# Patient Record
Sex: Male | Born: 1955 | Race: White | Hispanic: No | Smoking: Former smoker
Health system: Southern US, Community
[De-identification: ages and names within clinical notes are randomized; demographics above are authoritative.]

## PROBLEM LIST (undated history)

## (undated) DIAGNOSIS — T7840XA Allergy, unspecified, initial encounter: Secondary | ICD-10-CM

## (undated) DIAGNOSIS — E119 Type 2 diabetes mellitus without complications: Secondary | ICD-10-CM

## (undated) DIAGNOSIS — E669 Obesity, unspecified: Secondary | ICD-10-CM

## (undated) DIAGNOSIS — E7800 Pure hypercholesterolemia, unspecified: Secondary | ICD-10-CM

## (undated) DIAGNOSIS — G4730 Sleep apnea, unspecified: Secondary | ICD-10-CM

## (undated) DIAGNOSIS — R1319 Other dysphagia: Secondary | ICD-10-CM

## (undated) DIAGNOSIS — K635 Polyp of colon: Secondary | ICD-10-CM

## (undated) DIAGNOSIS — R42 Dizziness and giddiness: Secondary | ICD-10-CM

## (undated) DIAGNOSIS — K219 Gastro-esophageal reflux disease without esophagitis: Secondary | ICD-10-CM

## (undated) DIAGNOSIS — M109 Gout, unspecified: Secondary | ICD-10-CM

## (undated) DIAGNOSIS — I10 Essential (primary) hypertension: Secondary | ICD-10-CM

## (undated) DIAGNOSIS — K2970 Gastritis, unspecified, without bleeding: Secondary | ICD-10-CM

## (undated) DIAGNOSIS — J449 Chronic obstructive pulmonary disease, unspecified: Secondary | ICD-10-CM

## (undated) DIAGNOSIS — M1990 Unspecified osteoarthritis, unspecified site: Secondary | ICD-10-CM

## (undated) HISTORY — DX: Unspecified osteoarthritis, unspecified site: M19.90

## (undated) HISTORY — DX: Sleep apnea, unspecified: G47.30

## (undated) HISTORY — DX: Allergy, unspecified, initial encounter: T78.40XA

## (undated) HISTORY — PX: OTHER SURGICAL HISTORY: SHX169

## (undated) HISTORY — DX: Obesity, unspecified: E66.9

## (undated) HISTORY — DX: Gastro-esophageal reflux disease without esophagitis: K21.9

---

## 2005-10-10 ENCOUNTER — Ambulatory Visit: Payer: Self-pay | Admitting: Family Medicine

## 2013-05-17 ENCOUNTER — Ambulatory Visit: Payer: Self-pay | Admitting: Physician Assistant

## 2013-06-16 ENCOUNTER — Ambulatory Visit: Payer: Self-pay | Admitting: Physical Medicine and Rehabilitation

## 2014-02-07 ENCOUNTER — Ambulatory Visit: Payer: Self-pay | Admitting: Gastroenterology

## 2014-04-07 ENCOUNTER — Ambulatory Visit: Payer: Self-pay | Admitting: Internal Medicine

## 2014-04-08 DIAGNOSIS — M47812 Spondylosis without myelopathy or radiculopathy, cervical region: Secondary | ICD-10-CM

## 2014-04-08 HISTORY — DX: Spondylosis without myelopathy or radiculopathy, cervical region: M47.812

## 2014-09-19 ENCOUNTER — Encounter: Payer: Self-pay | Admitting: Emergency Medicine

## 2014-09-19 ENCOUNTER — Ambulatory Visit
Admission: EM | Admit: 2014-09-19 | Discharge: 2014-09-19 | Disposition: A | Discharge status: Home / Self Care | Payer: 59 | Attending: Family Medicine | Admitting: Family Medicine

## 2014-09-19 DIAGNOSIS — H109 Unspecified conjunctivitis: Secondary | ICD-10-CM

## 2014-09-19 HISTORY — DX: Pure hypercholesterolemia, unspecified: E78.00

## 2014-09-19 HISTORY — DX: Gout, unspecified: M10.9

## 2014-09-19 HISTORY — DX: Essential (primary) hypertension: I10

## 2014-09-19 NOTE — ED Notes (Signed)
Bilateral eye burning since this morning.  Burning yesterday also reported.  No relief from eye drops.  This morning reports some drainage from eyes.  Reports "sleep like" drainage.

## 2014-09-19 NOTE — ED Provider Notes (Signed)
CSN: [id number]     Arrival date & time 09/19/14  1337 History   First MD Initiated Contact with Patient 09/19/14 1505     Chief Complaint  Patient presents with  . Eye Problem   (Consider location/radiation/quality/duration/timing/severity/associated sxs/prior Treatment) Patient is a 59 y.o. male presenting with eye problem.  Eye Problem Location:  Both Severity:  Mild Onset quality:  Sudden Duration:  2 days (2 days h/o mild watery drainage, tearing from both eyes; denies foreign body sensation or injury) Timing:  Constant Associated symptoms: redness and tearing   Associated symptoms: no blurred vision, no decreased vision and no photophobia   Associated symptoms comment:  Mild runny nose and sneezing   Past Medical History  Diagnosis Date  . Gout   . Hypertension   . High cholesterol    History reviewed. No pertinent past surgical history. History reviewed. No pertinent family history. History  Substance Use Topics  . Smoking status: Former Research scientist (life sciences)  . Smokeless tobacco: Current User    Types: Chew     Comment: dip occassionally  . Alcohol Use: Yes     Comment: occ    Review of Systems  Eyes: Positive for redness. Negative for blurred vision and photophobia.    Allergies  Review of patient's allergies indicates no known allergies.  Home Medications   Prior to Admission medications   Medication Sig Start Date End Date Taking? Authorizing Provider  allopurinol (ZYLOPRIM) 300 MG tablet Take 300 mg by mouth daily.   Yes Historical Provider, MD  amLODipine (NORVASC) 10 MG tablet Take 10 mg by mouth daily.   Yes Historical Provider, MD  aspirin 81 MG tablet Take 81 mg by mouth daily.   Yes Historical Provider, MD  atorvastatin (LIPITOR) 40 MG tablet Take 40 mg by mouth daily.   Yes Historical Provider, MD  cetirizine (ZYRTEC) 10 MG tablet Take 10 mg by mouth daily.   Yes Historical Provider, MD  ezetimibe (ZETIA) 10 MG tablet Take 10 mg by mouth daily.   Yes  Historical Provider, MD  gabapentin (NEURONTIN) 300 MG capsule Take 300 mg by mouth 3 (three) times daily.   Yes Historical Provider, MD  losartan (COZAAR) 100 MG tablet Take 100 mg by mouth daily.   Yes Historical Provider, MD  naproxen (NAPROSYN) 500 MG tablet Take 500 mg by mouth 2 (two) times daily with a meal.   Yes Historical Provider, MD  omeprazole (PRILOSEC) 20 MG capsule Take 20 mg by mouth daily.   Yes Historical Provider, MD   BP 129/70 mmHg  Pulse 50  Temp(Src) 98 F (36.7 C) (Oral)  Resp 16  Ht 5\' 8"  (1.727 m)  Wt 215 lb (97.523 kg)  BMI 32.70 kg/m2  SpO2 97% Physical Exam  Constitutional: He appears well-developed and well-nourished. No distress.  HENT:  Head: Normocephalic.  Eyes: EOM and lids are normal. Pupils are equal, round, and reactive to light. Left eye exhibits no discharge. Right conjunctiva is injected. Left conjunctiva is injected. No scleral icterus.  Conjunctiva mildly injected; no purulent drainage; mild tearing  Skin: He is not diaphoretic.  Nursing note reviewed.   ED Course  Procedures (including critical care time) Labs Review Labs Reviewed - No data to display  Imaging Review No results found.   MDM   1. Bilateral conjunctivitis   (likely viral vs allergic)  Plan: 1. diagnosis reviewed with patient 2. Recommend supportive treatment with otc oral antihistamines, preservative free lubricant eye drops, cool compresses 3. F/u  prn if symptoms worsen or don't improve    Norval Gable, MD 09/19/14 2037

## 2014-09-19 NOTE — ED Notes (Signed)
Verified medications from care everywhere and med list updated

## 2014-09-19 NOTE — ED Notes (Addendum)
Unable to verify med list because patient does not know name of  Medications.

## 2014-10-04 ENCOUNTER — Encounter: Payer: Self-pay | Admitting: Family Medicine

## 2014-10-04 ENCOUNTER — Ambulatory Visit (INDEPENDENT_AMBULATORY_CARE_PROVIDER_SITE_OTHER): Payer: 59 | Admitting: Family Medicine

## 2014-10-04 VITALS — BP 132/73 | HR 52 | Temp 98.6°F | Ht 67.7 in | Wt 217.0 lb

## 2014-10-04 DIAGNOSIS — R5383 Other fatigue: Secondary | ICD-10-CM

## 2014-10-04 DIAGNOSIS — I10 Essential (primary) hypertension: Secondary | ICD-10-CM | POA: Insufficient documentation

## 2014-10-04 DIAGNOSIS — E7800 Pure hypercholesterolemia, unspecified: Secondary | ICD-10-CM | POA: Insufficient documentation

## 2014-10-04 MED ORDER — EZETIMIBE 10 MG PO TABS: 10.0000 mg | ORAL_TABLET | Freq: Every day | ORAL | Status: DC

## 2014-10-04 MED ORDER — OMEPRAZOLE 20 MG PO CPDR: 20.0000 mg | DELAYED_RELEASE_CAPSULE | Freq: Every day | ORAL | Status: DC

## 2014-10-04 MED ORDER — LOSARTAN POTASSIUM 100 MG PO TABS: 100.0000 mg | ORAL_TABLET | Freq: Every day | ORAL | Status: DC

## 2014-10-04 MED ORDER — ATORVASTATIN CALCIUM 40 MG PO TABS: 40.0000 mg | ORAL_TABLET | Freq: Every day | ORAL | Status: DC

## 2014-10-04 MED ORDER — GABAPENTIN 300 MG PO CAPS: 300.0000 mg | ORAL_CAPSULE | Freq: Three times a day (TID) | ORAL | Status: DC

## 2014-10-04 MED ORDER — AMLODIPINE BESYLATE 10 MG PO TABS: 10.0000 mg | ORAL_TABLET | Freq: Every day | ORAL | Status: DC

## 2014-10-04 MED ORDER — ALLOPURINOL 300 MG PO TABS: 300.0000 mg | ORAL_TABLET | Freq: Every day | ORAL | Status: DC

## 2014-10-04 MED ORDER — CETIRIZINE HCL 10 MG PO TABS: 10.0000 mg | ORAL_TABLET | Freq: Every day | ORAL | Status: DC

## 2014-10-04 NOTE — Assessment & Plan Note (Signed)
The current medical regimen is effective;  continue present plan and medications.  

## 2014-10-04 NOTE — Progress Notes (Signed)
   BP 132/73 mmHg  Pulse 52  Temp(Src) 98.6 F (37 C)  Ht 5' 7.7" (1.72 m)  Wt 217 lb (98.431 kg)  BMI 33.27 kg/m2  SpO2 96%   Subjective:    Patient ID: Daniel Cook, male    DOB: [birth year], 57 y.o.   MRN: [id number]  HPI: Daniel Cook is a 59 y.o. male  Chief Complaint  Patient presents with  . Hyperlipidemia  . Hypertension  . Gout  . Fatigue  pts medical issues stable but feels sluggish. Discussed with card and not felt to be angina sx Using CPAP and record usually 8+ hrs and sleeps well Other meds doing well Takes every day and no side effects Previously felt fatigue was depression and a Dr  Hanley Seamen prozac made feel not himself and threw away. Done years ago  No depression sx now  Relevant past medical, surgical, family and social history reviewed and updated as indicated. Interim medical history since our last visit reviewed. Allergies and medications reviewed and updated.  Review of Systems  Constitutional: Negative.   Respiratory: Negative.   Cardiovascular: Negative.     Per HPI unless specifically indicated above     Objective:    BP 132/73 mmHg  Pulse 52  Temp(Src) 98.6 F (37 C)  Ht 5' 7.7" (1.72 m)  Wt 217 lb (98.431 kg)  BMI 33.27 kg/m2  SpO2 96%  Wt Readings from Last 3 Encounters:  10/04/14 217 lb (98.431 kg)  04/14/14 216 lb (97.977 kg)  09/19/14 215 lb (97.523 kg)    Physical Exam  Constitutional: He is oriented to person, place, and time. He appears well-developed and well-nourished. No distress.  HENT:  Head: Normocephalic and atraumatic.  Right Ear: Hearing normal.  Left Ear: Hearing normal.  Nose: Nose normal.  Eyes: Conjunctivae, EOM and lids are normal. Right eye exhibits no discharge. Left eye exhibits no discharge. No scleral icterus.  Cardiovascular: Normal rate, regular rhythm and normal heart sounds.   Pulmonary/Chest: Effort normal and breath sounds normal. No respiratory distress.  Abdominal: Soft. Bowel  sounds are normal. There is no splenomegaly or hepatomegaly.  Musculoskeletal: Normal range of motion.  Neurological: He is alert and oriented to person, place, and time.  Skin: Skin is intact. No rash noted. No erythema.  Psychiatric: He has a normal mood and affect. His speech is normal and behavior is normal. Judgment and thought content normal. Cognition and memory are normal.    No results found for this or any previous visit.    Assessment & Plan:   Problem List Items Addressed This Visit      Cardiovascular and Mediastinum   Hypertension - Primary    The current medical regimen is effective;  continue present plan and medications.       Relevant Medications   amLODipine (NORVASC) 10 MG tablet   atorvastatin (LIPITOR) 40 MG tablet   ezetimibe (ZETIA) 10 MG tablet   losartan (COZAAR) 100 MG tablet   Other Relevant Orders   Basic metabolic panel    Other Visit Diagnoses    Other fatigue        check BMP discuss nutrition exercise wt loss hold zyrtec to see if fatague sx     Relevant Orders    Basic metabolic panel        Follow up plan: Return for Physical Exam.

## 2014-10-11 ENCOUNTER — Telehealth: Payer: Self-pay

## 2014-10-11 NOTE — Telephone Encounter (Signed)
Called and spoke with patient, appointment scheduled for 10/13/14 @ 9:00am.

## 2014-10-11 NOTE — Telephone Encounter (Signed)
-----   Message from Valerie Roys, DO sent at 10/11/2014  2:26 PM EDT ----- Regarding: Lab appt Please have patient come in for his BMP- it was ordered by Dr. Jeananne Rama and it looks like he didn't get it done.

## 2014-10-13 ENCOUNTER — Other Ambulatory Visit: Payer: 59

## 2014-10-13 DIAGNOSIS — I10 Essential (primary) hypertension: Secondary | ICD-10-CM

## 2014-10-14 ENCOUNTER — Other Ambulatory Visit: Payer: Self-pay | Admitting: Family Medicine

## 2014-10-14 ENCOUNTER — Encounter: Payer: Self-pay | Admitting: Family Medicine

## 2014-10-14 DIAGNOSIS — R739 Hyperglycemia, unspecified: Secondary | ICD-10-CM

## 2014-10-14 LAB — BASIC METABOLIC PANEL
BUN/Creatinine Ratio: 10 (ref 9–20)
BUN: 11 mg/dL (ref 6–24)
CALCIUM: 9.2 mg/dL (ref 8.7–10.2)
CO2: 24 mmol/L (ref 18–29)
Chloride: 101 mmol/L (ref 97–108)
Creatinine, Ser: 1.08 mg/dL (ref 0.76–1.27)
GFR calc non Af Amer: 76 mL/min/{1.73_m2} (ref >59)
GFR, EST AFRICAN AMERICAN: 88 mL/min/{1.73_m2} (ref >59)
Glucose: 128 mg/dL — ABNORMAL HIGH (ref 65–99)
POTASSIUM: 4.4 mmol/L (ref 3.5–5.2)
Sodium: 141 mmol/L (ref 134–144)

## 2014-10-26 ENCOUNTER — Encounter: Payer: Self-pay | Admitting: Family Medicine

## 2014-10-26 ENCOUNTER — Ambulatory Visit (INDEPENDENT_AMBULATORY_CARE_PROVIDER_SITE_OTHER): Payer: 59 | Admitting: Family Medicine

## 2014-10-26 VITALS — BP 111/68 | HR 49 | Temp 99.6°F | Ht 68.0 in | Wt 213.0 lb

## 2014-10-26 DIAGNOSIS — M4692 Unspecified inflammatory spondylopathy, cervical region: Secondary | ICD-10-CM

## 2014-10-26 DIAGNOSIS — Z0000 Encounter for general adult medical examination without abnormal findings: Secondary | ICD-10-CM

## 2014-10-26 DIAGNOSIS — M1000 Idiopathic gout, unspecified site: Secondary | ICD-10-CM | POA: Diagnosis not present

## 2014-10-26 DIAGNOSIS — I10 Essential (primary) hypertension: Secondary | ICD-10-CM | POA: Diagnosis not present

## 2014-10-26 DIAGNOSIS — M109 Gout, unspecified: Secondary | ICD-10-CM | POA: Insufficient documentation

## 2014-10-26 DIAGNOSIS — E7800 Pure hypercholesterolemia, unspecified: Secondary | ICD-10-CM

## 2014-10-26 DIAGNOSIS — M47812 Spondylosis without myelopathy or radiculopathy, cervical region: Secondary | ICD-10-CM | POA: Insufficient documentation

## 2014-10-26 DIAGNOSIS — G4730 Sleep apnea, unspecified: Secondary | ICD-10-CM

## 2014-10-26 LAB — MICROSCOPIC EXAMINATION

## 2014-10-26 LAB — MICROALBUMIN, URINE WAIVED
Creatinine, Urine Waived: 200 mg/dL (ref 10–300)
Microalb, Ur Waived: 80 mg/L — ABNORMAL HIGH (ref 0–19)

## 2014-10-26 LAB — URINALYSIS, ROUTINE W REFLEX MICROSCOPIC
Bilirubin, UA: NEGATIVE
Glucose, UA: NEGATIVE
Ketones, UA: NEGATIVE
Leukocytes, UA: NEGATIVE
NITRITE UA: NEGATIVE
Specific Gravity, UA: 1.01 (ref 1.005–1.030)
UUROB: 0.2 mg/dL (ref 0.2–1.0)
pH, UA: 6.5 (ref 5.0–7.5)

## 2014-10-26 MED ORDER — COLCHICINE 0.6 MG PO TABS: 0.6000 mg | ORAL_TABLET | Freq: Every day | ORAL | Status: DC | PRN

## 2014-10-26 NOTE — Assessment & Plan Note (Signed)
The current medical regimen is effective;  continue present plan and medications.  

## 2014-10-26 NOTE — Progress Notes (Signed)
BP 111/68 mmHg  Pulse 49  Temp(Src) 99.6 F (37.6 C)  Ht 5\' 8"  (1.727 m)  Wt 213 lb (96.616 kg)  BMI 32.39 kg/m2  SpO2 95%   Subjective:    Patient ID: Daniel Cook, male    DOB: 12-Dec-1955, 59 y.o.   MRN: [id number]  HPI: Daniel Cook is a 59 y.o. male  Chief Complaint  Patient presents with  . Annual Exam   patient doing well with medications symptoms using his CPAP faithfully The C3 arthritis is controlled well with gabapentin.  Relevant past medical, surgical, family and social history reviewed and updated as indicated. Interim medical history since our last visit reviewed. Allergies and medications reviewed and updated.  Review of Systems  Constitutional: Negative.   HENT: Negative.   Eyes: Negative.   Respiratory: Negative.   Cardiovascular: Negative.   Endocrine: Negative.   Musculoskeletal: Negative.   Skin: Negative.   Allergic/Immunologic: Negative.   Neurological: Negative.   Hematological: Negative.   Psychiatric/Behavioral: Negative.     Per HPI unless specifically indicated above     Objective:    BP 111/68 mmHg  Pulse 49  Temp(Src) 99.6 F (37.6 C)  Ht 5\' 8"  (1.727 m)  Wt 213 lb (96.616 kg)  BMI 32.39 kg/m2  SpO2 95%  Wt Readings from Last 3 Encounters:  10/26/14 213 lb (96.616 kg)  10/04/14 217 lb (98.431 kg)  04/14/14 216 lb (97.977 kg)    Physical Exam  Constitutional: He is oriented to person, place, and time. He appears well-developed and well-nourished.  HENT:  Head: Normocephalic and atraumatic.  Right Ear: External ear normal.  Left Ear: External ear normal.  Eyes: Conjunctivae and EOM are normal. Pupils are equal, round, and reactive to light.  Neck: Normal range of motion. Neck supple.  Cardiovascular: Normal rate, regular rhythm, normal heart sounds and intact distal pulses.   Pulmonary/Chest: Effort normal and breath sounds normal.  Abdominal: Soft. Bowel sounds are normal. There is no splenomegaly or  hepatomegaly.  Genitourinary: Rectum normal, prostate normal and penis normal.  Musculoskeletal: Normal range of motion.  Neurological: He is alert and oriented to person, place, and time. He has normal reflexes.  Skin: No rash noted. No erythema.  Psychiatric: He has a normal mood and affect. His behavior is normal. Judgment and thought content normal.    Results for orders placed or performed in visit on 55/73/22  Basic Metabolic Panel (BMET)  Result Value Ref Range   Glucose 128 (H) 65 - 99 mg/dL   BUN 11 6 - 24 mg/dL   Creatinine, Ser 1.08 0.76 - 1.27 mg/dL   GFR calc non Af Amer 76 >59 mL/min/1.73   GFR calc Af Amer 88 >59 mL/min/1.73   BUN/Creatinine Ratio 10 9 - 20   Sodium 141 134 - 144 mmol/L   Potassium 4.4 3.5 - 5.2 mmol/L   Chloride 101 97 - 108 mmol/L   CO2 24 18 - 29 mmol/L   Calcium 9.2 8.7 - 10.2 mg/dL      Assessment & Plan:   Problem List Items Addressed This Visit      Cardiovascular and Mediastinum   Hypertension    The current medical regimen is effective;  continue present plan and medications.       Relevant Orders   Basic metabolic panel     Musculoskeletal and Integument   Neck arthritis   Relevant Medications   colchicine 0.6 MG tablet     Other  High cholesterol    The current medical regimen is effective;  continue present plan and medications.       Relevant Orders   LP+ALT+AST [place], Waived   Sleep apnea   Gout    Other Visit Diagnoses    Routine general medical examination at a health care facility    -  Primary    Relevant Orders    CBC with Differential/Platelet    Comprehensive metabolic panel    Lipid Panel w/o Chol/HDL Ratio    PSA    TSH    Urinalysis, Routine w reflex microscopic    Microalbumin, Urine Waived    Interval gout        Relevant Medications    colchicine 0.6 MG tablet    Other Relevant Orders    Uric acid        Follow up plan: Return in about 6 months (around 04/28/2015), or if symptoms  worsen or fail to improve, for med check.

## 2014-10-27 ENCOUNTER — Encounter: Payer: Self-pay | Admitting: Family Medicine

## 2014-10-27 LAB — CBC WITH DIFFERENTIAL/PLATELET
BASOS: 0 %
Basophils Absolute: 0 10*3/uL (ref 0.0–0.2)
EOS (ABSOLUTE): 0.1 10*3/uL (ref 0.0–0.4)
Eos: 1 %
HEMOGLOBIN: 13.8 g/dL (ref 12.6–17.7)
Hematocrit: 41.5 % (ref 37.5–51.0)
IMMATURE GRANS (ABS): 0 10*3/uL (ref 0.0–0.1)
Immature Granulocytes: 0 %
LYMPHS ABS: 1.9 10*3/uL (ref 0.7–3.1)
Lymphs: 19 %
MCH: 31.4 pg (ref 26.6–33.0)
MCHC: 33.3 g/dL (ref 31.5–35.7)
MCV: 94 fL (ref 79–97)
MONOCYTES: 5 %
MONOS ABS: 0.5 10*3/uL (ref 0.1–0.9)
NEUTROS ABS: 7.6 10*3/uL — AB (ref 1.4–7.0)
Neutrophils: 75 %
Platelets: 261 10*3/uL (ref 150–379)
RBC: 4.4 x10E6/uL (ref 4.14–5.80)
RDW: 14.1 % (ref 12.3–15.4)
WBC: 10.2 10*3/uL (ref 3.4–10.8)

## 2014-10-27 LAB — LIPID PANEL W/O CHOL/HDL RATIO
CHOLESTEROL TOTAL: 208 mg/dL — AB (ref 100–199)
HDL: 55 mg/dL (ref >39)
LDL CALC: 113 mg/dL — AB (ref 0–99)
TRIGLYCERIDES: 202 mg/dL — AB (ref 0–149)
VLDL Cholesterol Cal: 40 mg/dL (ref 5–40)

## 2014-10-27 LAB — COMPREHENSIVE METABOLIC PANEL
A/G RATIO: 1.7 (ref 1.1–2.5)
ALBUMIN: 4.3 g/dL (ref 3.5–5.5)
ALT: 34 IU/L (ref 0–44)
AST: 23 IU/L (ref 0–40)
Alkaline Phosphatase: 72 IU/L (ref 39–117)
BUN/Creatinine Ratio: 9 (ref 9–20)
BUN: 8 mg/dL (ref 6–24)
Bilirubin Total: 0.5 mg/dL (ref 0.0–1.2)
CO2: 24 mmol/L (ref 18–29)
CREATININE: 0.9 mg/dL (ref 0.76–1.27)
Calcium: 9.7 mg/dL (ref 8.7–10.2)
Chloride: 96 mmol/L — ABNORMAL LOW (ref 97–108)
GFR calc Af Amer: 109 mL/min/{1.73_m2} (ref >59)
GFR, EST NON AFRICAN AMERICAN: 94 mL/min/{1.73_m2} (ref >59)
GLOBULIN, TOTAL: 2.6 g/dL (ref 1.5–4.5)
Glucose: 97 mg/dL (ref 65–99)
POTASSIUM: 4.6 mmol/L (ref 3.5–5.2)
Sodium: 139 mmol/L (ref 134–144)
TOTAL PROTEIN: 6.9 g/dL (ref 6.0–8.5)

## 2014-10-27 LAB — URIC ACID: Uric Acid: 5.7 mg/dL (ref 3.7–8.6)

## 2014-10-27 LAB — TSH: TSH: 0.876 u[IU]/mL (ref 0.450–4.500)

## 2014-10-27 LAB — PSA: Prostate Specific Ag, Serum: 0.4 ng/mL (ref 0.0–4.0)

## 2014-11-22 ENCOUNTER — Telehealth: Payer: Self-pay | Admitting: Family Medicine

## 2014-11-22 MED ORDER — GABAPENTIN 300 MG PO CAPS: 300.0000 mg | ORAL_CAPSULE | Freq: Three times a day (TID) | ORAL | Status: DC

## 2014-11-22 MED ORDER — EZETIMIBE 10 MG PO TABS: 10.0000 mg | ORAL_TABLET | Freq: Every day | ORAL | Status: DC

## 2014-11-22 MED ORDER — LOSARTAN POTASSIUM 100 MG PO TABS: 100.0000 mg | ORAL_TABLET | Freq: Every day | ORAL | Status: DC

## 2014-11-22 MED ORDER — ALLOPURINOL 300 MG PO TABS: 300.0000 mg | ORAL_TABLET | Freq: Every day | ORAL | Status: DC

## 2014-11-22 MED ORDER — ATORVASTATIN CALCIUM 40 MG PO TABS: 40.0000 mg | ORAL_TABLET | Freq: Every day | ORAL | Status: DC

## 2014-11-22 MED ORDER — AMLODIPINE BESYLATE 10 MG PO TABS: 10.0000 mg | ORAL_TABLET | Freq: Every day | ORAL | Status: DC

## 2014-11-22 MED ORDER — OMEPRAZOLE 20 MG PO CPDR: 20.0000 mg | DELAYED_RELEASE_CAPSULE | Freq: Every day | ORAL | Status: DC

## 2014-11-22 NOTE — Telephone Encounter (Signed)
Pt called stated he has not received his mail order medications. Pt wants to know if RX's can be resent. Pharm [facility]. Pt stated he is not completely out but is running low. Thanks.

## 2015-01-27 ENCOUNTER — Telehealth: Payer: Self-pay | Admitting: Family Medicine

## 2015-01-27 NOTE — Telephone Encounter (Signed)
Pt uses mail order service and would like to have a 30 day supply sent in to [facility] for albuterol, allopurinol, amlodipine, atorvastatin, zyrtec, ezetimibe, losartan, naproxen and omeprazole

## 2015-01-30 MED ORDER — ALLOPURINOL 300 MG PO TABS: 300.0000 mg | ORAL_TABLET | Freq: Every day | ORAL | Status: DC

## 2015-01-30 MED ORDER — EZETIMIBE 10 MG PO TABS: 10.0000 mg | ORAL_TABLET | Freq: Every day | ORAL | Status: DC

## 2015-01-30 MED ORDER — LOSARTAN POTASSIUM 100 MG PO TABS: 100.0000 mg | ORAL_TABLET | Freq: Every day | ORAL | Status: DC

## 2015-01-30 MED ORDER — ATORVASTATIN CALCIUM 40 MG PO TABS: 40.0000 mg | ORAL_TABLET | Freq: Every day | ORAL | Status: DC

## 2015-01-30 MED ORDER — ALBUTEROL SULFATE HFA 108 (90 BASE) MCG/ACT IN AERS: 2.0000 | INHALATION_SPRAY | Freq: Four times a day (QID) | RESPIRATORY_TRACT | Status: DC | PRN

## 2015-01-30 MED ORDER — NAPROXEN 500 MG PO TABS: 500.0000 mg | ORAL_TABLET | Freq: Two times a day (BID) | ORAL | Status: DC

## 2015-01-30 MED ORDER — CETIRIZINE HCL 10 MG PO TABS: 10.0000 mg | ORAL_TABLET | Freq: Every day | ORAL | Status: DC

## 2015-01-30 MED ORDER — AMLODIPINE BESYLATE 10 MG PO TABS: 10.0000 mg | ORAL_TABLET | Freq: Every day | ORAL | Status: DC

## 2015-01-30 MED ORDER — OMEPRAZOLE 20 MG PO CPDR: 20.0000 mg | DELAYED_RELEASE_CAPSULE | Freq: Every day | ORAL | Status: DC

## 2015-02-24 ENCOUNTER — Encounter: Payer: Self-pay | Admitting: Family Medicine

## 2015-04-09 DIAGNOSIS — M51369 Other intervertebral disc degeneration, lumbar region without mention of lumbar back pain or lower extremity pain: Secondary | ICD-10-CM

## 2015-04-09 HISTORY — DX: Other intervertebral disc degeneration, lumbar region without mention of lumbar back pain or lower extremity pain: M51.369

## 2015-04-22 ENCOUNTER — Other Ambulatory Visit: Payer: Self-pay | Admitting: Family Medicine

## 2015-04-28 ENCOUNTER — Encounter: Payer: Self-pay | Admitting: Emergency Medicine

## 2015-04-28 ENCOUNTER — Ambulatory Visit
Admission: EM | Admit: 2015-04-28 | Discharge: 2015-04-28 | Disposition: A | Discharge status: Home / Self Care | Payer: 59 | Attending: Family Medicine | Admitting: Family Medicine

## 2015-04-28 DIAGNOSIS — J0110 Acute frontal sinusitis, unspecified: Secondary | ICD-10-CM | POA: Diagnosis not present

## 2015-04-28 MED ORDER — AMOXICILLIN 875 MG PO TABS: 875.0000 mg | ORAL_TABLET | Freq: Two times a day (BID) | ORAL | Status: DC

## 2015-04-28 NOTE — ED Provider Notes (Signed)
CSN: [id number]     Arrival date & time 04/28/15  1134 History   First MD Initiated Contact with Patient 04/28/15 1349     Chief Complaint  Patient presents with  . Facial Pain  . Nasal Congestion   (Consider location/radiation/quality/duration/timing/severity/associated sxs/prior Treatment) Patient is a 60 y.o. male presenting with URI. The history is provided by the patient.  URI Presenting symptoms: congestion, facial pain, fatigue, fever and rhinorrhea   Presenting symptoms: no cough, no ear pain and no sore throat   Severity:  Moderate Onset quality:  Sudden Duration:  8 days Timing:  Constant Progression:  Worsening Chronicity:  New Relieved by:  Nothing Ineffective treatments:  OTC medications Associated symptoms: headaches and sinus pain   Associated symptoms: no arthralgias, no myalgias, no sneezing, no swollen glands and no wheezing     Past Medical History  Diagnosis Date  . Gout   . High cholesterol   . Hypertension   . Gout   . Obesity   . Sleep apnea    Past Surgical History  Procedure Laterality Date  . Knee surgeries     History reviewed. No pertinent family history. Social History  Substance Use Topics  . Smoking status: Former Smoker    Types: Cigarettes    Quit date: 10/04/1998  . Smokeless tobacco: Former Systems developer    Types: Chew    Quit date: 03/22/2014     Comment: dip occassionally  . Alcohol Use: Yes     Comment: occ    Review of Systems  Constitutional: Positive for fever and fatigue.  HENT: Positive for congestion and rhinorrhea. Negative for ear pain, sneezing and sore throat.   Respiratory: Negative for cough and wheezing.   Musculoskeletal: Negative for myalgias and arthralgias.  Neurological: Positive for headaches.    Allergies  Review of patient's allergies indicates no known allergies.  Home Medications   Prior to Admission medications   Medication Sig Start Date End Date Taking? Authorizing Provider  albuterol (PROVENTIL  HFA;VENTOLIN HFA) 108 (90 BASE) MCG/ACT inhaler Inhale 2 puffs into the lungs every 6 (six) hours as needed for wheezing or shortness of breath. 01/30/15   Guadalupe Maple, MD  allopurinol (ZYLOPRIM) 300 MG tablet Take 1 tablet (300 mg total) by mouth daily. 01/30/15   Guadalupe Maple, MD  amLODipine (NORVASC) 10 MG tablet Take 1 tablet (10 mg total) by mouth daily. 01/30/15   Guadalupe Maple, MD  amoxicillin (AMOXIL) 875 MG tablet Take 1 tablet (875 mg total) by mouth 2 (two) times daily. 04/28/15   Norval Gable, MD  aspirin 81 MG tablet Take 325 mg by mouth daily.     Historical Provider, MD  atorvastatin (LIPITOR) 40 MG tablet Take 1 tablet (40 mg total) by mouth daily. 01/30/15   Guadalupe Maple, MD  cetirizine (ZYRTEC) 10 MG tablet Take 1 tablet (10 mg total) by mouth daily. 01/30/15   Guadalupe Maple, MD  colchicine 0.6 MG tablet Take 1 tablet (0.6 mg total) by mouth daily as needed. 10/26/14   Guadalupe Maple, MD  ezetimibe (ZETIA) 10 MG tablet Take 1 tablet (10 mg total) by mouth daily. 01/30/15   Guadalupe Maple, MD  gabapentin (NEURONTIN) 300 MG capsule Take 1 capsule by mouth 3  times daily 04/24/15   Megan P Johnson, DO  losartan (COZAAR) 100 MG tablet Take 1 tablet (100 mg total) by mouth daily. 01/30/15   Guadalupe Maple, MD  naproxen (NAPROSYN) 500 MG  tablet Take 1 tablet (500 mg total) by mouth 2 (two) times daily with a meal. 01/30/15   Guadalupe Maple, MD  omeprazole (PRILOSEC) 20 MG capsule Take 1 capsule (20 mg total) by mouth daily. 01/30/15   Guadalupe Maple, MD   Meds Ordered and Administered this Visit  Medications - No data to display  BP 124/53 mmHg  Pulse 54  Temp(Src) 97.6 F (36.4 C) (Tympanic)  Resp 16  Ht 5\' 8"  (1.727 m)  Wt 220 lb (99.791 kg)  BMI 33.46 kg/m2  SpO2 98% No data found.   Physical Exam  Constitutional: He appears well-developed and well-nourished. No distress.  HENT:  Head: Normocephalic and atraumatic.  Right Ear: Tympanic membrane,  external ear and ear canal normal.  Left Ear: Tympanic membrane, external ear and ear canal normal.  Nose: Right sinus exhibits frontal sinus tenderness. Right sinus exhibits no maxillary sinus tenderness. Left sinus exhibits frontal sinus tenderness. Left sinus exhibits no maxillary sinus tenderness.  Mouth/Throat: Uvula is midline and mucous membranes are normal. Posterior oropharyngeal erythema present. No oropharyngeal exudate, posterior oropharyngeal edema or tonsillar abscesses.  Eyes: Conjunctivae and EOM are normal. Pupils are equal, round, and reactive to light. Right eye exhibits no discharge. Left eye exhibits no discharge. No scleral icterus.  Neck: Normal range of motion. Neck supple. No tracheal deviation present. No thyromegaly present.  Cardiovascular: Normal rate, regular rhythm and normal heart sounds.   Pulmonary/Chest: Effort normal and breath sounds normal. No stridor. No respiratory distress. He has no wheezes. He has no rales. He exhibits no tenderness.  Lymphadenopathy:    He has no cervical adenopathy.  Neurological: He is alert.  Skin: Skin is warm and dry. No rash noted. He is not diaphoretic.  Nursing note and vitals reviewed.   ED Course  Procedures (including critical care time)  Labs Review Labs Reviewed - No data to display  Imaging Review No results found.   Visual Acuity Review  Right Eye Distance:   Left Eye Distance:   Bilateral Distance:    Right Eye Near:   Left Eye Near:    Bilateral Near:         MDM   1. Acute frontal sinusitis, recurrence not specified    Discharge Medication List as of 04/28/2015  2:02 PM    START taking these medications   Details  amoxicillin (AMOXIL) 875 MG tablet Take 1 tablet (875 mg total) by mouth 2 (two) times daily., Starting 04/28/2015, Until Discontinued, Normal       1.  diagnosis reviewed with patient 2. rx as per orders above; reviewed possible side effects, interactions, risks and benefits   3. Recommend supportive treatment with otc flonase and decongestant 4. Follow-up prn if symptoms worsen or don't improve    Norval Gable, MD 04/28/15 [phone]

## 2015-04-28 NOTE — ED Notes (Signed)
Patient c/o sinus pain and pressure, nasal congestion and HAs for a week.  Patient denies fevers.

## 2015-05-03 ENCOUNTER — Ambulatory Visit: Payer: 59 | Admitting: Family Medicine

## 2015-05-23 ENCOUNTER — Encounter: Payer: Self-pay | Admitting: Family Medicine

## 2015-05-23 ENCOUNTER — Ambulatory Visit (INDEPENDENT_AMBULATORY_CARE_PROVIDER_SITE_OTHER): Payer: 59 | Admitting: Family Medicine

## 2015-05-23 VITALS — BP 134/70 | HR 48 | Temp 98.5°F | Ht 67.2 in | Wt 220.0 lb

## 2015-05-23 DIAGNOSIS — R739 Hyperglycemia, unspecified: Secondary | ICD-10-CM | POA: Diagnosis not present

## 2015-05-23 DIAGNOSIS — I10 Essential (primary) hypertension: Secondary | ICD-10-CM

## 2015-05-23 DIAGNOSIS — E7800 Pure hypercholesterolemia, unspecified: Secondary | ICD-10-CM

## 2015-05-23 DIAGNOSIS — M4692 Unspecified inflammatory spondylopathy, cervical region: Secondary | ICD-10-CM

## 2015-05-23 DIAGNOSIS — M47812 Spondylosis without myelopathy or radiculopathy, cervical region: Secondary | ICD-10-CM

## 2015-05-23 LAB — LP+ALT+AST PICCOLO, WAIVED
ALT (SGPT) PICCOLO, WAIVED: 31 U/L (ref 10–47)
AST (SGOT) PICCOLO, WAIVED: 24 U/L (ref 11–38)
CHOL/HDL RATIO PICCOLO,WAIVE: 3.4 mg/dL
CHOLESTEROL PICCOLO, WAIVED: 213 mg/dL — AB (ref <200)
HDL CHOL PICCOLO, WAIVED: 62 mg/dL (ref >59)
LDL CHOL CALC PICCOLO WAIVED: 98 mg/dL (ref <100)
Triglycerides Piccolo,Waived: 263 mg/dL — ABNORMAL HIGH (ref <150)
VLDL CHOL CALC PICCOLO,WAIVE: 53 mg/dL — AB (ref <30)

## 2015-05-23 LAB — BAYER DCA HB A1C WAIVED: HB A1C (BAYER DCA - WAIVED): 6.2 % (ref <7.0)

## 2015-05-23 MED ORDER — NAPROXEN 500 MG PO TABS: 500.0000 mg | ORAL_TABLET | Freq: Two times a day (BID) | ORAL | Status: DC

## 2015-05-23 MED ORDER — GABAPENTIN 300 MG PO CAPS: ORAL_CAPSULE | ORAL | Status: DC

## 2015-05-23 NOTE — Assessment & Plan Note (Signed)
The current medical regimen is effective;  continue present plan and medications.  

## 2015-05-23 NOTE — Progress Notes (Signed)
BP 134/70 mmHg  Pulse 48  Temp(Src) 98.5 F (36.9 C)  Ht 5' 7.2" (1.707 m)  Wt 220 lb (99.791 kg)  BMI 34.25 kg/m2  SpO2 97%   Subjective:    Patient ID: Daniel Cook, male    DOB: [birth year], 58 y.o.   MRN: [id number]  HPI: Daniel Cook is a 60 y.o. male  Chief Complaint  Patient presents with  . Hyperlipidemia  . Hypertension   patient for recheck cholesterol blood pressure doing well with no complaints from medications taken faithfully Also no complaints from gout Takes reflux medicines occasional Naprosyn all in all doing well  Relevant past medical, surgical, family and social history reviewed and updated as indicated. Interim medical history since our last visit reviewed. Allergies and medications reviewed and updated.  Review of Systems  Constitutional: Negative.   Respiratory: Negative.   Cardiovascular: Negative.     Per HPI unless specifically indicated above     Objective:    BP 134/70 mmHg  Pulse 48  Temp(Src) 98.5 F (36.9 C)  Ht 5' 7.2" (1.707 m)  Wt 220 lb (99.791 kg)  BMI 34.25 kg/m2  SpO2 97%  Wt Readings from Last 3 Encounters:  05/23/15 220 lb (99.791 kg)  04/28/15 220 lb (99.791 kg)  10/26/14 213 lb (96.616 kg)    Physical Exam  Constitutional: He is oriented to person, place, and time. He appears well-developed and well-nourished. No distress.  HENT:  Head: Normocephalic and atraumatic.  Right Ear: Hearing normal.  Left Ear: Hearing normal.  Nose: Nose normal.  Eyes: Conjunctivae and lids are normal. Right eye exhibits no discharge. Left eye exhibits no discharge. No scleral icterus.  Cardiovascular: Normal rate, regular rhythm and normal heart sounds.   Pulmonary/Chest: Effort normal and breath sounds normal. No respiratory distress.  Musculoskeletal: Normal range of motion.  Neurological: He is alert and oriented to person, place, and time.  Skin: Skin is intact. No rash noted.  Psychiatric: He has a normal mood and  affect. His speech is normal and behavior is normal. Judgment and thought content normal. Cognition and memory are normal.    Results for orders placed or performed in visit on 10/26/14  Microscopic Examination  Result Value Ref Range   WBC, UA 0-5 0 -  5 /hpf   RBC, UA 0-2 0 -  2 /hpf   Epithelial Cells (non renal) 0-10 0 - 10 /hpf   Bacteria, UA Few None seen/Few  CBC with Differential/Platelet  Result Value Ref Range   WBC 10.2 3.4 - 10.8 x10E3/uL   RBC 4.40 4.14 - 5.80 x10E6/uL   Hemoglobin 13.8 12.6 - 17.7 g/dL   Hematocrit 41.5 37.5 - 51.0 %   MCV 94 79 - 97 fL   MCH 31.4 26.6 - 33.0 pg   MCHC 33.3 31.5 - 35.7 g/dL   RDW 14.1 12.3 - 15.4 %   Platelets 261 150 - 379 x10E3/uL   Neutrophils 75 %   Lymphs 19 %   Monocytes 5 %   Eos 1 %   Basos 0 %   Neutrophils Absolute 7.6 (H) 1.4 - 7.0 x10E3/uL   Lymphocytes Absolute 1.9 0.7 - 3.1 x10E3/uL   Monocytes Absolute 0.5 0.1 - 0.9 x10E3/uL   EOS (ABSOLUTE) 0.1 0.0 - 0.4 x10E3/uL   Basophils Absolute 0.0 0.0 - 0.2 x10E3/uL   Immature Granulocytes 0 %   Immature Grans (Abs) 0.0 0.0 - 0.1 x10E3/uL  Comprehensive metabolic panel  Result Value  Ref Range   Glucose 97 65 - 99 mg/dL   BUN 8 6 - 24 mg/dL   Creatinine, Ser 0.90 0.76 - 1.27 mg/dL   GFR calc non Af Amer 94 >59 mL/min/1.73   GFR calc Af Amer 109 >59 mL/min/1.73   BUN/Creatinine Ratio 9 9 - 20   Sodium 139 134 - 144 mmol/L   Potassium 4.6 3.5 - 5.2 mmol/L   Chloride 96 (L) 97 - 108 mmol/L   CO2 24 18 - 29 mmol/L   Calcium 9.7 8.7 - 10.2 mg/dL   Total Protein 6.9 6.0 - 8.5 g/dL   Albumin 4.3 3.5 - 5.5 g/dL   Globulin, Total 2.6 1.5 - 4.5 g/dL   Albumin/Globulin Ratio 1.7 1.1 - 2.5   Bilirubin Total 0.5 0.0 - 1.2 mg/dL   Alkaline Phosphatase 72 39 - 117 IU/L   AST 23 0 - 40 IU/L   ALT 34 0 - 44 IU/L  Lipid Panel w/o Chol/HDL Ratio  Result Value Ref Range   Cholesterol, Total 208 (H) 100 - 199 mg/dL   Triglycerides 202 (H) 0 - 149 mg/dL   HDL 55 >39 mg/dL    VLDL Cholesterol Cal 40 5 - 40 mg/dL   LDL Calculated 113 (H) 0 - 99 mg/dL  PSA  Result Value Ref Range   Prostate Specific Ag, Serum 0.4 0.0 - 4.0 ng/mL  TSH  Result Value Ref Range   TSH 0.876 0.450 - 4.500 uIU/mL  Uric acid  Result Value Ref Range   Uric Acid 5.7 3.7 - 8.6 mg/dL  Urinalysis, Routine w reflex microscopic  Result Value Ref Range   Specific Gravity, UA 1.010 1.005 - 1.030   pH, UA 6.5 5.0 - 7.5   Color, UA Yellow Yellow   Appearance Ur Clear Clear   Leukocytes, UA Negative Negative   Protein, UA Trace Negative/Trace   Glucose, UA Negative Negative   Ketones, UA Negative Negative   RBC, UA Trace (A) Negative   Bilirubin, UA Negative Negative   Urobilinogen, Ur 0.2 0.2 - 1.0 mg/dL   Nitrite, UA Negative Negative   Microscopic Examination See below:   Microalbumin, Urine Waived  Result Value Ref Range   Microalb, Ur Waived 80 (H) 0 - 19 mg/L   Creatinine, Urine Waived 200 10 - 300 mg/dL   Microalb/Creat Ratio 30-300 (H) <30 mg/g      Assessment & Plan:   Problem List Items Addressed This Visit      Cardiovascular and Mediastinum   Hypertension    The current medical regimen is effective;  continue present plan and medications.         Musculoskeletal and Integument   Neck arthritis ([place]) - Primary    stable      Relevant Medications   naproxen (NAPROSYN) 500 MG tablet     Other   High cholesterol    The current medical regimen is effective;  continue present plan and medications.        Other Visit Diagnoses    Hyperglycemia            Follow up plan: Return in about 6 months (around 11/20/2015) for Physical Exam.

## 2015-05-23 NOTE — Assessment & Plan Note (Signed)
stable °

## 2015-05-24 ENCOUNTER — Encounter: Payer: Self-pay | Admitting: Family Medicine

## 2015-05-24 LAB — BASIC METABOLIC PANEL
BUN/Creatinine Ratio: 16 (ref 9–20)
BUN: 11 mg/dL (ref 6–24)
CALCIUM: 9.4 mg/dL (ref 8.7–10.2)
CO2: 24 mmol/L (ref 18–29)
Chloride: 100 mmol/L (ref 96–106)
Creatinine, Ser: 0.68 mg/dL — ABNORMAL LOW (ref 0.76–1.27)
GFR, EST AFRICAN AMERICAN: 123 mL/min/{1.73_m2} (ref >59)
GFR, EST NON AFRICAN AMERICAN: 106 mL/min/{1.73_m2} (ref >59)
Glucose: 86 mg/dL (ref 65–99)
Potassium: 4.3 mmol/L (ref 3.5–5.2)
Sodium: 140 mmol/L (ref 134–144)

## 2015-06-27 ENCOUNTER — Other Ambulatory Visit: Payer: Self-pay | Admitting: Family Medicine

## 2015-07-17 ENCOUNTER — Ambulatory Visit
Admission: EM | Admit: 2015-07-17 | Discharge: 2015-07-17 | Disposition: A | Discharge status: Home / Self Care | Payer: 59 | Attending: Family Medicine | Admitting: Family Medicine

## 2015-07-17 ENCOUNTER — Encounter: Payer: Self-pay | Admitting: Emergency Medicine

## 2015-07-17 DIAGNOSIS — J069 Acute upper respiratory infection, unspecified: Secondary | ICD-10-CM | POA: Diagnosis not present

## 2015-07-17 MED ORDER — BENZONATATE 200 MG PO CAPS: ORAL_CAPSULE | ORAL | Status: DC

## 2015-07-17 MED ORDER — ALBUTEROL SULFATE HFA 108 (90 BASE) MCG/ACT IN AERS: 1.0000 | INHALATION_SPRAY | Freq: Four times a day (QID) | RESPIRATORY_TRACT | Status: DC | PRN

## 2015-07-17 MED ORDER — HYDROCOD POLST-CPM POLST ER 10-8 MG/5ML PO SUER: 5.0000 mL | Freq: Two times a day (BID) | ORAL | Status: DC

## 2015-07-17 NOTE — ED Provider Notes (Signed)
CSN: [id number]     Arrival date & time 07/17/15  1145 History   First MD Initiated Contact with Patient 07/17/15 1314     Chief Complaint  Patient presents with  . Cough   (Consider location/radiation/quality/duration/timing/severity/associated sxs/prior Treatment) HPI   This is a 60 year old gentleman who presents with cough and chest congestion for 6 days. He states that at his workplace several other employees have had similar coughs. He states that it is productive of an oyster-colored sputum at times but for the most part is a dry cough. He denies any fever or chills. He states that his company required him to go through a [facility] at first prescribed amoxicillin about 3 days ago but he states that it has not helped. Temperature is 97.8 pulse 55 respirations 17 blood pressure 144/67 and O2 sat of 98% he is a nonsmoker and quit about 16 years ago       Past Medical History  Diagnosis Date  . Gout   . High cholesterol   . Hypertension   . Obesity   . Sleep apnea    Past Surgical History  Procedure Laterality Date  . Knee surgeries     History reviewed. No pertinent family history. Social History  Substance Use Topics  . Smoking status: Former Smoker    Types: Cigarettes    Quit date: 10/04/1998  . Smokeless tobacco: Former Systems developer    Types: Chew    Quit date: 03/22/2014     Comment: dip occassionally  . Alcohol Use: Yes     Comment: occ    Review of Systems  Constitutional: Positive for activity change. Negative for fever, chills and fatigue.  HENT: Positive for congestion, postnasal drip and sinus pressure.   Respiratory: Positive for cough. Negative for shortness of breath, wheezing and stridor.   All other systems reviewed and are negative.   Allergies  Review of patient's allergies indicates no known allergies.  Home Medications   Prior to Admission medications   Medication Sig Start Date End Date Taking? Authorizing Provider  albuterol (PROVENTIL  HFA;VENTOLIN HFA) 108 (90 Base) MCG/ACT inhaler Inhale 1-2 puffs into the lungs every 6 (six) hours as needed for wheezing or shortness of breath. 07/17/15   Lorin Picket, PA-C  allopurinol (ZYLOPRIM) 300 MG tablet Take 1 tablet (300 mg total) by mouth daily. 01/30/15   Guadalupe Maple, MD  amLODipine (NORVASC) 10 MG tablet Take 1 tablet (10 mg total) by mouth daily. 01/30/15   Guadalupe Maple, MD  aspirin 81 MG tablet Take 325 mg by mouth daily.     Historical Provider, MD  atorvastatin (LIPITOR) 40 MG tablet Take 1 tablet (40 mg total) by mouth daily. 01/30/15   Guadalupe Maple, MD  benzonatate (TESSALON) 200 MG capsule Take one cap TID PRN cough 07/17/15   Lorin Picket, PA-C  cetirizine (ZYRTEC) 10 MG tablet Take 1 tablet (10 mg total) by mouth daily. 01/30/15   Guadalupe Maple, MD  chlorpheniramine-HYDROcodone (TUSSIONEX PENNKINETIC ER) 10-8 MG/5ML SUER Take 5 mLs by mouth 2 (two) times daily. 07/17/15   Lorin Picket, PA-C  colchicine 0.6 MG tablet Take 1 tablet by mouth  daily as needed 06/27/15   Guadalupe Maple, MD  ezetimibe (ZETIA) 10 MG tablet Take 1 tablet (10 mg total) by mouth daily. 01/30/15   Guadalupe Maple, MD  gabapentin (NEURONTIN) 300 MG capsule 2 each morning and 2 each evening 05/23/15   Guadalupe Maple,  MD  losartan (COZAAR) 100 MG tablet Take 1 tablet (100 mg total) by mouth daily. 01/30/15   Guadalupe Maple, MD  naproxen (NAPROSYN) 500 MG tablet Take 1 tablet (500 mg total) by mouth 2 (two) times daily with a meal. 05/23/15   Guadalupe Maple, MD  omeprazole (PRILOSEC) 20 MG capsule Take 1 capsule (20 mg total) by mouth daily. 01/30/15   Guadalupe Maple, MD   Meds Ordered and Administered this Visit  Medications - No data to display  BP 144/67 mmHg  Pulse 55  Temp(Src) 97.8 F (36.6 C) (Tympanic)  Resp 17  Ht 5\' 8"  (1.727 m)  Wt 218 lb (98.884 kg)  BMI 33.15 kg/m2  SpO2 98% No data found.   Physical Exam  Constitutional: He is oriented to person, place,  and time. He appears well-developed and well-nourished. No distress.  HENT:  Head: Normocephalic and atraumatic.  Eyes: Conjunctivae are normal. Pupils are equal, round, and reactive to light.  Neck: Normal range of motion. Neck supple.  Pulmonary/Chest: No respiratory distress. He has no wheezes. He has no rales.  Musculoskeletal: Normal range of motion. He exhibits no edema or tenderness.  Neurological: He is alert and oriented to person, place, and time.  Skin: Skin is warm and dry. He is not diaphoretic.  Psychiatric: He has a normal mood and affect. His behavior is normal. Judgment and thought content normal.  Nursing note and vitals reviewed.   ED Course  Procedures (including critical care time)  Labs Review Labs Reviewed - No data to display  Imaging Review No results found.   Visual Acuity Review  Right Eye Distance:   Left Eye Distance:   Bilateral Distance:    Right Eye Near:   Left Eye Near:    Bilateral Near:         MDM   1. Acute URI    Discharge Medication List as of 07/17/2015  1:32 PM    START taking these medications   Details  benzonatate (TESSALON) 200 MG capsule Take one cap TID PRN cough, Print    chlorpheniramine-HYDROcodone (TUSSIONEX PENNKINETIC ER) 10-8 MG/5ML SUER Take 5 mLs by mouth 2 (two) times daily., Starting 07/17/2015, Until Discontinued, Print      Plan: 1. Test/x-ray results and diagnosis reviewed with patient 2. rx as per orders; risks, benefits, potential side effects reviewed with patient 3. Recommend supportive treatment with Albuterol rest and fluids. I told him that this is most likely a viral illness and that the amoxicillin will not be beneficial for him. I've given him the option of quitting her continuing on. I will give him some medication to help control his cough. I've written a note for her dad today off of work he is often tomorrow under work on Wednesday. It is not improving of her testing to see his primary care  for follow-up 4. F/u prn if symptoms worsen or don't improve     Lorin Picket, PA-C 07/17/15 1407

## 2015-07-17 NOTE — Discharge Instructions (Signed)
Cool Mist Vaporizers Vaporizers may help relieve the symptoms of a cough and cold. They add moisture to the air, which helps mucus to become thinner and less sticky. This makes it easier to breathe and cough up secretions. Cool mist vaporizers do not cause serious burns like hot mist vaporizers, which may also be called steamers or humidifiers. Vaporizers have not been proven to help with colds. You should not use a vaporizer if you are allergic to mold. HOME CARE INSTRUCTIONS  Follow the package instructions for the vaporizer.  Do not use anything other than distilled water in the vaporizer.  Do not run the vaporizer all of the time. This can cause mold or bacteria to grow in the vaporizer.  Clean the vaporizer after each time it is used.  Clean and dry the vaporizer well before storing it.  Stop using the vaporizer if worsening respiratory symptoms develop.   This information is not intended to replace advice given to you by your health care provider. Make sure you discuss any questions you have with your health care provider.   Document Released: 12/21/2003 Document Revised: 03/30/2013 Document Reviewed: 08/12/2012 Elsevier Interactive Patient Education 2016 Elsevier Inc[employer]  Upper Respiratory Infection, Adult Most upper respiratory infections (URIs) are a viral infection of the air passages leading to the lungs. A URI affects the nose, throat, and upper air passages. The most common type of URI is nasopharyngitis and is typically referred to as "the common cold." URIs run their course and usually go away on their own. Most of the time, a URI does not require medical attention, but sometimes a bacterial infection in the upper airways can follow a viral infection. This is called a secondary infection. Sinus and middle ear infections are common types of secondary upper respiratory infections. Bacterial pneumonia can also complicate a URI. A URI can worsen asthma and chronic obstructive  pulmonary disease (COPD). Sometimes, these complications can require emergency medical care and may be life threatening.  CAUSES Almost all URIs are caused by viruses. A virus is a type of germ and can spread from one person to another.  RISKS FACTORS You may be at risk for a URI if:   You smoke.   You have chronic heart or lung disease.  You have a weakened defense (immune) system.   You are very young or very old.   You have nasal allergies or asthma.  You work in crowded or poorly ventilated areas.  You work in health care facilities or schools. SIGNS AND SYMPTOMS  Symptoms typically develop 2-3 days after you come in contact with a cold virus. Most viral URIs last 7-10 days. However, viral URIs from the influenza virus (flu virus) can last 14-18 days and are typically more severe. Symptoms may include:   Runny or stuffy (congested) nose.   Sneezing.   Cough.   Sore throat.   Headache.   Fatigue.   Fever.   Loss of appetite.   Pain in your forehead, behind your eyes, and over your cheekbones (sinus pain).  Muscle aches.  DIAGNOSIS  Your health care provider may diagnose a URI by:  Physical exam.  Tests to check that your symptoms are not due to another condition such as:  Strep throat.  Sinusitis.  Pneumonia.  Asthma. TREATMENT  A URI goes away on its own with time. It cannot be cured with medicines, but medicines may be prescribed or recommended to relieve symptoms. Medicines may help:  Reduce your fever.  Reduce  your cough.  Relieve nasal congestion. HOME CARE INSTRUCTIONS   Take medicines only as directed by your health care provider.   Gargle warm saltwater or take cough drops to comfort your throat as directed by your health care provider.  Use a warm mist humidifier or inhale steam from a shower to increase air moisture. This may make it easier to breathe.  Drink enough fluid to keep your urine clear or pale yellow.   Eat  soups and other clear broths and maintain good nutrition.   Rest as needed.   Return to work when your temperature has returned to normal or as your health care provider advises. You may need to stay home longer to avoid infecting others. You can also use a face mask and careful hand washing to prevent spread of the virus.  Increase the usage of your inhaler if you have asthma.   Do not use any tobacco products, including cigarettes, chewing tobacco, or electronic cigarettes. If you need help quitting, ask your health care provider. PREVENTION  The best way to protect yourself from getting a cold is to practice good hygiene.   Avoid oral or hand contact with people with cold symptoms.   Wash your hands often if contact occurs.  There is no clear evidence that vitamin C, vitamin E, echinacea, or exercise reduces the chance of developing a cold. However, it is always recommended to get plenty of rest, exercise, and practice good nutrition.  SEEK MEDICAL CARE IF:   You are getting worse rather than better.   Your symptoms are not controlled by medicine.   You have chills.  You have worsening shortness of breath.  You have brown or red mucus.  You have yellow or brown nasal discharge.  You have pain in your face, especially when you bend forward.  You have a fever.  You have swollen neck glands.  You have pain while swallowing.  You have white areas in the back of your throat. SEEK IMMEDIATE MEDICAL CARE IF:   You have severe or persistent:  Headache.  Ear pain.  Sinus pain.  Chest pain.  You have chronic lung disease and any of the following:  Wheezing.  Prolonged cough.  Coughing up blood.  A change in your usual mucus.  You have a stiff neck.  You have changes in your:  Vision.  Hearing.  Thinking.  Mood. MAKE SURE YOU:   Understand these instructions.  Will watch your condition.  Will get help right away if you are not doing well or  get worse.   This information is not intended to replace advice given to you by your health care provider. Make sure you discuss any questions you have with your health care provider.   Document Released: 09/18/2000 Document Revised: 08/09/2014 Document Reviewed: 06/30/2013 Elsevier Interactive Patient Education 2016 Elsevier Inc.  Viral Infections A viral infection can be caused by different types of viruses.Most viral infections are not serious and resolve on their own. However, some infections may cause severe symptoms and may lead to further complications. SYMPTOMS Viruses can frequently cause:  Minor sore throat.  Aches and pains.  Headaches.  Runny nose.  Different types of rashes.  Watery eyes.  Tiredness.  Cough.  Loss of appetite.  Gastrointestinal infections, resulting in nausea, vomiting, and diarrhea. These symptoms do not respond to antibiotics because the infection is not caused by bacteria. However, you might catch a bacterial infection following the viral infection. This is sometimes called a "  superinfection." Symptoms of such a bacterial infection may include:  Worsening sore throat with pus and difficulty swallowing.  Swollen neck glands.  Chills and a high or persistent fever.  Severe headache.  Tenderness over the sinuses.  Persistent overall ill feeling (malaise), muscle aches, and tiredness (fatigue).  Persistent cough.  Yellow, green, or brown mucus production with coughing. HOME CARE INSTRUCTIONS   Only take over-the-counter or prescription medicines for pain, discomfort, diarrhea, or fever as directed by your caregiver.  Drink enough water and fluids to keep your urine clear or pale yellow. Sports drinks can provide valuable electrolytes, sugars, and hydration.  Get plenty of rest and maintain proper nutrition. Soups and broths with crackers or rice are fine. SEEK IMMEDIATE MEDICAL CARE IF:   You have severe headaches, shortness of  breath, chest pain, neck pain, or an unusual rash.  You have uncontrolled vomiting, diarrhea, or you are unable to keep down fluids.  You or your child has an oral temperature above 102 F (38.9 C), not controlled by medicine.  Your baby is older than 3 months with a rectal temperature of 102 F (38.9 C) or higher.  Your baby is 45 months old or younger with a rectal temperature of 100.4 F (38 C) or higher. MAKE SURE YOU:   Understand these instructions.  Will watch your condition.  Will get help right away if you are not doing well or get worse.   This information is not intended to replace advice given to you by your health care provider. Make sure you discuss any questions you have with your health care provider.   Document Released: 01/02/2005 Document Revised: 06/17/2011 Document Reviewed: 08/31/2014 Elsevier Interactive Patient Education [employer].

## 2015-07-17 NOTE — ED Notes (Signed)
Patient c/o cough and chest congestion since last Wed. Patient denies fevers.  

## 2015-07-25 ENCOUNTER — Encounter: Payer: Self-pay | Admitting: Family Medicine

## 2015-07-25 ENCOUNTER — Ambulatory Visit (INDEPENDENT_AMBULATORY_CARE_PROVIDER_SITE_OTHER): Payer: 59 | Admitting: Family Medicine

## 2015-07-25 VITALS — BP 120/71 | HR 50 | Temp 99.0°F | Ht 67.7 in | Wt 213.0 lb

## 2015-07-25 DIAGNOSIS — J019 Acute sinusitis, unspecified: Secondary | ICD-10-CM

## 2015-07-25 MED ORDER — LEVOFLOXACIN 750 MG PO TABS: 750.0000 mg | ORAL_TABLET | Freq: Every day | ORAL | Status: DC

## 2015-07-25 NOTE — Progress Notes (Signed)
BP 120/71 mmHg  Pulse 50  Temp(Src) 99 F (37.2 C)  Ht 5' 7.7" (1.72 m)  Wt 213 lb (96.616 kg)  BMI 32.66 kg/m2  SpO2 96%   Subjective:    Patient ID: Daniel Cook, male    DOB: [birth year], 58 y.o.   MRN: [id number]  HPI: Daniel Cook is a 60 y.o. male  Chief Complaint  Patient presents with  . URI    April 7, seen at two walk in clinics, neg flu, very fatigued   Patient with multiple complaints of URI sinus congestion cough is been ongoing for 2-3 weeks treated first with Amoxil by:: Nurse system did not help a bit. Walk-in clinic gave him some other cough medicine no antibiotics flu test was negative Patient now intermittent fevers not eating's been losing weight nicely still coughing sinus congestion pressure sloshing sensation with bending Having marked systemic symptoms of fevers chills and marked fatigue and drowsiness Relevant past medical, surgical, family and social history reviewed and updated as indicated. Interim medical history since our last visit reviewed. Allergies and medications reviewed and updated.  Review of Systems  Constitutional: Positive for fever, chills, diaphoresis and fatigue.  HENT: Positive for congestion, rhinorrhea, sinus pressure, sneezing and sore throat.   Respiratory: Positive for cough and wheezing.   Cardiovascular: Negative for chest pain, palpitations and leg swelling.    Per HPI unless specifically indicated above     Objective:    BP 120/71 mmHg  Pulse 50  Temp(Src) 99 F (37.2 C)  Ht 5' 7.7" (1.72 m)  Wt 213 lb (96.616 kg)  BMI 32.66 kg/m2  SpO2 96%  Wt Readings from Last 3 Encounters:  07/25/15 213 lb (96.616 kg)  07/17/15 218 lb (98.884 kg)  05/23/15 220 lb (99.791 kg)    Physical Exam  Constitutional: He is oriented to person, place, and time. He appears well-developed and well-nourished. No distress.  HENT:  Head: Normocephalic and atraumatic.  Right Ear: Hearing and external ear normal.  Left  Ear: Hearing and external ear normal.  Nose: Nose normal.  Mouth/Throat: Oropharyngeal exudate present.  Eyes: Conjunctivae and lids are normal. Right eye exhibits no discharge. Left eye exhibits no discharge. No scleral icterus.  Cardiovascular: Normal rate, regular rhythm and normal heart sounds.   Pulmonary/Chest: Effort normal and breath sounds normal. No respiratory distress.  Occasional rhonchi no wheezes  Musculoskeletal: Normal range of motion.  Neurological: He is alert and oriented to person, place, and time.  Skin: Skin is intact. No rash noted.  Psychiatric: He has a normal mood and affect. His speech is normal and behavior is normal. Judgment and thought content normal. Cognition and memory are normal.    Results for orders placed or performed in visit on [id number]  Basic metabolic panel  Result Value Ref Range   Glucose 86 65 - 99 mg/dL   BUN 11 6 - 24 mg/dL   Creatinine, Ser 0.68 (L) 0.76 - 1.27 mg/dL   GFR calc non Af Amer 106 >59 mL/min/1.73   GFR calc Af Amer 123 >59 mL/min/1.73   BUN/Creatinine Ratio 16 9 - 20   Sodium 140 134 - 144 mmol/L   Potassium 4.3 3.5 - 5.2 mmol/L   Chloride 100 96 - 106 mmol/L   CO2 24 18 - 29 mmol/L   Calcium 9.4 8.7 - 10.2 mg/dL  LP+ALT+AST Piccolo, Waived  Result Value Ref Range   ALT (SGPT) Piccolo, Waived 31 10 - 47 U/L  AST (SGOT) Piccolo, Waived 24 11 - 38 U/L   Cholesterol Piccolo, Waived 213 (H) <200 mg/dL   HDL Chol Piccolo, Waived 62 >59 mg/dL   Triglycerides Piccolo,Waived 263 (H) <150 mg/dL   Chol/HDL Ratio Piccolo,Waive 3.4 mg/dL   LDL Chol Calc Piccolo Waived 98 <100 mg/dL   VLDL Chol Calc Piccolo,Waive 53 (H) <30 mg/dL  Bayer DCA Hb A1c Waived  Result Value Ref Range   Bayer DCA Hb A1c Waived 6.2 <7.0 %      Assessment & Plan:   Problem List Items Addressed This Visit    None    Visit Diagnoses    Acute sinusitis, recurrence not specified, unspecified location    -  Primary    Discussed sinusitis care and  treatment cautions with Levaquin about tendon rupture use of over-the-counter medications Tylenol Mucinex nasal rinse    Relevant Medications    loratadine (CLARITIN) 10 MG tablet    levofloxacin (LEVAQUIN) 750 MG tablet        Follow up plan: Return if symptoms worsen or fail to improve, for As scheduled.

## 2015-10-08 ENCOUNTER — Other Ambulatory Visit: Payer: Self-pay | Admitting: Family Medicine

## 2015-11-21 ENCOUNTER — Ambulatory Visit (INDEPENDENT_AMBULATORY_CARE_PROVIDER_SITE_OTHER): Payer: 59 | Admitting: Family Medicine

## 2015-11-21 ENCOUNTER — Encounter: Payer: Self-pay | Admitting: Family Medicine

## 2015-11-21 VITALS — BP 128/72 | HR 47 | Temp 98.3°F | Ht 68.6 in | Wt 224.0 lb

## 2015-11-21 DIAGNOSIS — R197 Diarrhea, unspecified: Secondary | ICD-10-CM | POA: Diagnosis not present

## 2015-11-21 DIAGNOSIS — I10 Essential (primary) hypertension: Secondary | ICD-10-CM

## 2015-11-21 NOTE — Progress Notes (Signed)
BP 128/72 (BP Location: Left Arm, Patient Position: Sitting, Cuff Size: Normal)   Pulse (!) 47   Temp 98.3 F (36.8 C)   Ht 5' 8.6" (1.742 m) Comment: with shoes  Wt 224 lb (101.6 kg) Comment: with shoes  SpO2 97%   BMI 33.47 kg/m    Subjective:    Patient ID: Daniel Cook, male    DOB: 1956/02/28, 60 y.o.   MRN: [id number]  HPI: Daniel Cook is a 60 y.o. male  Chief Complaint  Patient presents with  . Diarrhea    x 1 week  . eyes watering    bilateral x1 week   Patient with intermittent loose stools as a routine history. But over the last week or 2 has been having multiple episodes of diarrhea a day up to 5 or so times. Has not soiled his pants. There is no blood in his stool or urine. Maybe a little bit of fever earlier this week. But nothing special. Has had an episode of taking Imodium which seemed to help with his stools but then stopped. Has not changed his diet nor noted any dietary cause.  Also some intermittent watering eyes no real associations   Relevant past medical, surgical, family and social history reviewed and updated as indicated. Interim medical history since our last visit reviewed. Allergies and medications reviewed and updated.  Review of Systems  Constitutional: Negative.   Respiratory: Negative.   Cardiovascular: Negative.     Per HPI unless specifically indicated above     Objective:    BP 128/72 (BP Location: Left Arm, Patient Position: Sitting, Cuff Size: Normal)   Pulse (!) 47   Temp 98.3 F (36.8 C)   Ht 5' 8.6" (1.742 m) Comment: with shoes  Wt 224 lb (101.6 kg) Comment: with shoes  SpO2 97%   BMI 33.47 kg/m   Wt Readings from Last 3 Encounters:  11/21/15 224 lb (101.6 kg)  07/25/15 213 lb (96.6 kg)  07/17/15 218 lb (98.9 kg)    Physical Exam  Constitutional: He is oriented to person, place, and time. He appears well-developed and well-nourished. No distress.  HENT:  Head: Normocephalic and atraumatic.  Right  Ear: Hearing and external ear normal.  Left Ear: Hearing and external ear normal.  Nose: Nose normal.  Mouth/Throat: Oropharynx is clear and moist.  Eyes: Conjunctivae and lids are normal. Right eye exhibits no discharge. Left eye exhibits no discharge. No scleral icterus.  Cardiovascular: Normal rate and normal heart sounds.   Pulmonary/Chest: Effort normal and breath sounds normal. No respiratory distress.  Abdominal: Soft. Bowel sounds are normal. He exhibits no distension and no mass. There is no tenderness. There is no rebound and no guarding.  Musculoskeletal: Normal range of motion.  Neurological: He is alert and oriented to person, place, and time.  Skin: Skin is intact. No rash noted.  Psychiatric: He has a normal mood and affect. His speech is normal and behavior is normal. Judgment and thought content normal. Cognition and memory are normal.    Results for orders placed or performed in visit on [id number]  Basic metabolic panel  Result Value Ref Range   Glucose 86 65 - 99 mg/dL   BUN 11 6 - 24 mg/dL   Creatinine, Ser 0.68 (L) 0.76 - 1.27 mg/dL   GFR calc non Af Amer 106 >59 mL/min/1.73   GFR calc Af Amer 123 >59 mL/min/1.73   BUN/Creatinine Ratio 16 9 - 20   Sodium 140  134 - 144 mmol/L   Potassium 4.3 3.5 - 5.2 mmol/L   Chloride 100 96 - 106 mmol/L   CO2 24 18 - 29 mmol/L   Calcium 9.4 8.7 - 10.2 mg/dL  LP+ALT+AST Piccolo, Waived  Result Value Ref Range   ALT (SGPT) Piccolo, Waived 31 10 - 47 U/L   AST (SGOT) Piccolo, Waived 24 11 - 38 U/L   Cholesterol Piccolo, Waived 213 (H) <200 mg/dL   HDL Chol Piccolo, Waived 62 >59 mg/dL   Triglycerides Piccolo,Waived 263 (H) <150 mg/dL   Chol/HDL Ratio Piccolo,Waive 3.4 mg/dL   LDL Chol Calc Piccolo Waived 98 <100 mg/dL   VLDL Chol Calc Piccolo,Waive 53 (H) <30 mg/dL  Bayer DCA Hb A1c Waived  Result Value Ref Range   Bayer DCA Hb A1c Waived 6.2 <7.0 %      Assessment & Plan:   Problem List Items Addressed This Visit       Cardiovascular and Mediastinum   Hypertension    The current medical regimen is effective;  continue present plan and medications.        Other Visit Diagnoses    Diarrhea, unspecified type    -  Primary   Discussed diarrhea will observe limit milk and milk products may use Imodium if not better will need to recheck stool cultures etc.      Discussed Eyecare maybe dry eyes are blocked tear ducts discuss massage and if problems persist will need further eye check up with eye doctor.   Follow up plan: Return if symptoms worsen or fail to improve, for As scheduled.

## 2015-11-21 NOTE — Assessment & Plan Note (Signed)
The current medical regimen is effective;  continue present plan and medications.  

## 2015-11-24 ENCOUNTER — Other Ambulatory Visit: Payer: Self-pay | Admitting: Family Medicine

## 2015-12-21 ENCOUNTER — Other Ambulatory Visit: Payer: Self-pay | Admitting: Family Medicine

## 2015-12-26 ENCOUNTER — Ambulatory Visit (INDEPENDENT_AMBULATORY_CARE_PROVIDER_SITE_OTHER): Payer: 59 | Admitting: Family Medicine

## 2015-12-26 ENCOUNTER — Encounter: Payer: Self-pay | Admitting: Family Medicine

## 2015-12-26 VITALS — BP 118/71 | HR 61 | Temp 98.9°F | Ht 67.4 in | Wt 217.0 lb

## 2015-12-26 DIAGNOSIS — I10 Essential (primary) hypertension: Secondary | ICD-10-CM

## 2015-12-26 DIAGNOSIS — Z1159 Encounter for screening for other viral diseases: Secondary | ICD-10-CM

## 2015-12-26 DIAGNOSIS — Z0000 Encounter for general adult medical examination without abnormal findings: Secondary | ICD-10-CM | POA: Diagnosis not present

## 2015-12-26 DIAGNOSIS — Z23 Encounter for immunization: Secondary | ICD-10-CM

## 2015-12-26 DIAGNOSIS — M51369 Other intervertebral disc degeneration, lumbar region without mention of lumbar back pain or lower extremity pain: Secondary | ICD-10-CM

## 2015-12-26 DIAGNOSIS — M47812 Spondylosis without myelopathy or radiculopathy, cervical region: Secondary | ICD-10-CM

## 2015-12-26 DIAGNOSIS — M5136 Other intervertebral disc degeneration, lumbar region: Secondary | ICD-10-CM | POA: Insufficient documentation

## 2015-12-26 DIAGNOSIS — M1000 Idiopathic gout, unspecified site: Secondary | ICD-10-CM

## 2015-12-26 LAB — URINALYSIS, ROUTINE W REFLEX MICROSCOPIC
BILIRUBIN UA: NEGATIVE
Glucose, UA: NEGATIVE
LEUKOCYTES UA: NEGATIVE
Nitrite, UA: NEGATIVE
PH UA: 7 (ref 5.0–7.5)
RBC UA: NEGATIVE
Specific Gravity, UA: 1.02 (ref 1.005–1.030)
Urobilinogen, Ur: 1 mg/dL (ref 0.2–1.0)

## 2015-12-26 LAB — MICROSCOPIC EXAMINATION

## 2015-12-26 MED ORDER — NAPROXEN 500 MG PO TABS: 500.0000 mg | ORAL_TABLET | Freq: Two times a day (BID) | ORAL | 2 refills | Status: DC

## 2015-12-26 MED ORDER — OMEPRAZOLE 20 MG PO CPDR: 20.0000 mg | DELAYED_RELEASE_CAPSULE | Freq: Every day | ORAL | 4 refills | Status: DC

## 2015-12-26 MED ORDER — LOSARTAN POTASSIUM 100 MG PO TABS: 100.0000 mg | ORAL_TABLET | Freq: Every day | ORAL | 4 refills | Status: DC

## 2015-12-26 MED ORDER — ATORVASTATIN CALCIUM 40 MG PO TABS: 40.0000 mg | ORAL_TABLET | Freq: Every day | ORAL | 4 refills | Status: DC

## 2015-12-26 MED ORDER — EZETIMIBE 10 MG PO TABS: 10.0000 mg | ORAL_TABLET | Freq: Every day | ORAL | 12 refills | Status: DC

## 2015-12-26 MED ORDER — ALLOPURINOL 300 MG PO TABS: 300.0000 mg | ORAL_TABLET | Freq: Every day | ORAL | 4 refills | Status: DC

## 2015-12-26 MED ORDER — GABAPENTIN 300 MG PO CAPS: ORAL_CAPSULE | ORAL | 4 refills | Status: DC

## 2015-12-26 MED ORDER — AMLODIPINE BESYLATE 10 MG PO TABS: 10.0000 mg | ORAL_TABLET | Freq: Every day | ORAL | 4 refills | Status: DC

## 2015-12-26 NOTE — Patient Instructions (Addendum)

## 2015-12-26 NOTE — Assessment & Plan Note (Signed)
The current medical regimen is effective;  continue present plan and medications.  

## 2015-12-26 NOTE — Progress Notes (Signed)
BP 118/71 (BP Location: Left Arm, Patient Position: Sitting, Cuff Size: Normal)   Pulse 61   Temp 98.9 F (37.2 C)   Ht 5' 7.4" (1.712 m)   Wt 217 lb (98.4 kg)   SpO2 94%   BMI 33.58 kg/m    Subjective:    Patient ID: Daniel Cook, male    DOB: [birth year], 60 y.o.   MRN: [id number]  HPI: Daniel Cook is a 60 y.o. male  Chief Complaint  Patient presents with  . Annual Exam  Patient with slipped disc symptoms and treatment from Dr. Dorann Lodge at [facility]. Is going to wait for The year to assess deductible for MRI and possible injections. Another advantage to waiting is things can heal over time. Discussed other medications doing okay cervical arthritis has improved somewhat blood pressure good control no gout symptoms taking medications without side effects or problems. Relevant past medical, surgical, family and social history reviewed and updated as indicated. Interim medical history since our last visit reviewed. Allergies and medications reviewed and updated.  Review of Systems  Constitutional: Negative.   HENT: Negative.   Eyes: Negative.   Respiratory: Negative.   Cardiovascular: Negative.   Gastrointestinal: Negative.   Endocrine: Negative.   Genitourinary: Negative.   Musculoskeletal: Negative.   Skin: Negative.   Allergic/Immunologic: Negative.   Neurological: Negative.   Hematological: Negative.   Psychiatric/Behavioral: Negative.     Per HPI unless specifically indicated above     Objective:    BP 118/71 (BP Location: Left Arm, Patient Position: Sitting, Cuff Size: Normal)   Pulse 61   Temp 98.9 F (37.2 C)   Ht 5' 7.4" (1.712 m)   Wt 217 lb (98.4 kg)   SpO2 94%   BMI 33.58 kg/m   Wt Readings from Last 3 Encounters:  12/26/15 217 lb (98.4 kg)  11/21/15 224 lb (101.6 kg)  07/25/15 213 lb (96.6 kg)    Physical Exam  Constitutional: He is oriented to person, place, and time. He appears well-developed and well-nourished.  HENT:    Head: Normocephalic and atraumatic.  Right Ear: External ear normal.  Left Ear: External ear normal.  Eyes: Conjunctivae and EOM are normal. Pupils are equal, round, and reactive to light.  Neck: Normal range of motion. Neck supple.  Cardiovascular: Normal rate, regular rhythm, normal heart sounds and intact distal pulses.   Pulmonary/Chest: Effort normal and breath sounds normal.  Abdominal: Soft. Bowel sounds are normal. There is no splenomegaly or hepatomegaly.  Genitourinary: Rectum normal, prostate normal and penis normal.  Musculoskeletal: Normal range of motion.  Neurological: He is alert and oriented to person, place, and time. He has normal reflexes.  Skin: No rash noted. No erythema.  Psychiatric: He has a normal mood and affect. His behavior is normal. Judgment and thought content normal.    Results for orders placed or performed in visit on [id number]  Basic metabolic panel  Result Value Ref Range   Glucose 86 65 - 99 mg/dL   BUN 11 6 - 24 mg/dL   Creatinine, Ser 0.68 (L) 0.76 - 1.27 mg/dL   GFR calc non Af Amer 106 >59 mL/min/1.73   GFR calc Af Amer 123 >59 mL/min/1.73   BUN/Creatinine Ratio 16 9 - 20   Sodium 140 134 - 144 mmol/L   Potassium 4.3 3.5 - 5.2 mmol/L   Chloride 100 96 - 106 mmol/L   CO2 24 18 - 29 mmol/L   Calcium 9.4 8.7 - 10.2  mg/dL  LP+ALT+AST Piccolo, [employer]  Result Value Ref Range   ALT (SGPT) Piccolo, Waived 31 10 - 47 U/L   AST (SGOT) Piccolo, Waived 24 11 - 38 U/L   Cholesterol Piccolo, Waived 213 (H) <200 mg/dL   HDL Chol Piccolo, Waived 62 >59 mg/dL   Triglycerides Piccolo,Waived 263 (H) <150 mg/dL   Chol/HDL Ratio Piccolo,Waive 3.4 mg/dL   LDL Chol Calc Piccolo Waived 98 <100 mg/dL   VLDL Chol Calc Piccolo,Waive 53 (H) <30 mg/dL  Bayer DCA Hb A1c Waived  Result Value Ref Range   Bayer DCA Hb A1c Waived 6.2 <7.0 %      Assessment & Plan:   Problem List Items Addressed This Visit      Cardiovascular and Mediastinum   Hypertension     The current medical regimen is effective;  continue present plan and medications.       Relevant Medications   losartan (COZAAR) 100 MG tablet   ezetimibe (ZETIA) 10 MG tablet   atorvastatin (LIPITOR) 40 MG tablet   amLODipine (NORVASC) 10 MG tablet     Musculoskeletal and Integument   Neck arthritis (HCC)    stable      Relevant Medications   HYDROcodone-acetaminophen (NORCO/VICODIN) 5-325 MG tablet (Start on 03/02/2016)   naproxen (NAPROSYN) 500 MG tablet   allopurinol (ZYLOPRIM) 300 MG tablet   DDD (degenerative disc disease), lumbar    Discussed care and treatment of back and degenerative disc disease      Relevant Medications   HYDROcodone-acetaminophen (NORCO/VICODIN) 5-325 MG tablet (Start on 03/02/2016)   naproxen (NAPROSYN) 500 MG tablet   allopurinol (ZYLOPRIM) 300 MG tablet     Other   Gout    The current medical regimen is effective;  continue present plan and medications.        Other Visit Diagnoses    Annual physical exam    -  Primary   Relevant Orders   CBC with Differential/Platelet   Comprehensive metabolic panel   Lipid Panel w/o Chol/HDL Ratio   PSA   TSH   Urinalysis, Routine w reflex microscopic (not at [facility]   Need for hepatitis C screening test       Relevant Orders   Hepatitis C Antibody   Immunization due       Relevant Orders   Flu Vaccine QUAD 36+ mos PF IM (Fluarix & Fluzone Quad PF) (Completed)       Follow up plan: Return in about 6 months (around 06/24/2016) for BMP,  Lipids, ALT, AST.

## 2015-12-26 NOTE — Assessment & Plan Note (Signed)
Discussed care and treatment of back and degenerative disc disease

## 2015-12-26 NOTE — Assessment & Plan Note (Signed)
stable °

## 2015-12-27 ENCOUNTER — Telehealth: Payer: Self-pay | Admitting: Family Medicine

## 2015-12-27 ENCOUNTER — Encounter: Payer: Self-pay | Admitting: Family Medicine

## 2015-12-27 DIAGNOSIS — E7800 Pure hypercholesterolemia, unspecified: Secondary | ICD-10-CM

## 2015-12-27 LAB — CBC WITH DIFFERENTIAL/PLATELET
BASOS ABS: 0 10*3/uL (ref 0.0–0.2)
BASOS: 0 %
EOS (ABSOLUTE): 0.1 10*3/uL (ref 0.0–0.4)
Eos: 1 %
Hematocrit: 44 % (ref 37.5–51.0)
Hemoglobin: 14.3 g/dL (ref 12.6–17.7)
IMMATURE GRANS (ABS): 0 10*3/uL (ref 0.0–0.1)
IMMATURE GRANULOCYTES: 0 %
LYMPHS: 20 %
Lymphocytes Absolute: 1.6 10*3/uL (ref 0.7–3.1)
MCH: 31.4 pg (ref 26.6–33.0)
MCHC: 32.5 g/dL (ref 31.5–35.7)
MCV: 97 fL (ref 79–97)
Monocytes Absolute: 0.6 10*3/uL (ref 0.1–0.9)
Monocytes: 7 %
NEUTROS PCT: 72 %
Neutrophils Absolute: 5.7 10*3/uL (ref 1.4–7.0)
PLATELETS: 231 10*3/uL (ref 150–379)
RBC: 4.55 x10E6/uL (ref 4.14–5.80)
RDW: 14 % (ref 12.3–15.4)
WBC: 8 10*3/uL (ref 3.4–10.8)

## 2015-12-27 LAB — COMPREHENSIVE METABOLIC PANEL
ALK PHOS: 59 IU/L (ref 39–117)
ALT: 41 IU/L (ref 0–44)
AST: 25 IU/L (ref 0–40)
Albumin/Globulin Ratio: 1.8 (ref 1.2–2.2)
Albumin: 4.4 g/dL (ref 3.5–5.5)
BUN / CREAT RATIO: 18 (ref 9–20)
BUN: 16 mg/dL (ref 6–24)
Bilirubin Total: 0.5 mg/dL (ref 0.0–1.2)
CALCIUM: 9.8 mg/dL (ref 8.7–10.2)
CO2: 24 mmol/L (ref 18–29)
CREATININE: 0.89 mg/dL (ref 0.76–1.27)
Chloride: 97 mmol/L (ref 96–106)
GFR calc Af Amer: 109 mL/min/{1.73_m2} (ref >59)
GFR calc non Af Amer: 94 mL/min/{1.73_m2} (ref >59)
GLUCOSE: 117 mg/dL — AB (ref 65–99)
Globulin, Total: 2.4 g/dL (ref 1.5–4.5)
POTASSIUM: 4.5 mmol/L (ref 3.5–5.2)
SODIUM: 139 mmol/L (ref 134–144)
TOTAL PROTEIN: 6.8 g/dL (ref 6.0–8.5)

## 2015-12-27 LAB — LIPID PANEL W/O CHOL/HDL RATIO
CHOLESTEROL TOTAL: 271 mg/dL — AB (ref 100–199)
HDL: 47 mg/dL (ref >39)
Triglycerides: 779 mg/dL (ref 0–149)

## 2015-12-27 LAB — PSA: PROSTATE SPECIFIC AG, SERUM: 0.4 ng/mL (ref 0.0–4.0)

## 2015-12-27 LAB — HEPATITIS C ANTIBODY: Hep C Virus Ab: <0.1 s/co ratio (ref 0.0–0.9)

## 2015-12-27 LAB — TSH: TSH: 1.26 u[IU]/mL (ref 0.450–4.500)

## 2015-12-27 NOTE — Telephone Encounter (Signed)
-----   Message from Wynn Maudlin, [place] sent at 12/27/2015  1:42 PM EDT ----- labs

## 2015-12-27 NOTE — Telephone Encounter (Signed)
Phone call Discussed with patient elevated triglycerides and cholesterol discussed with patient diet exercise nutrition weight loss recheck lipid panel 1-2 months.

## 2016-03-26 ENCOUNTER — Ambulatory Visit: Payer: 59 | Admitting: Family Medicine

## 2016-03-27 ENCOUNTER — Other Ambulatory Visit: Payer: Self-pay | Admitting: Physical Medicine and Rehabilitation

## 2016-03-27 DIAGNOSIS — M5416 Radiculopathy, lumbar region: Secondary | ICD-10-CM

## 2016-04-11 ENCOUNTER — Ambulatory Visit: Payer: 59

## 2016-04-16 ENCOUNTER — Ambulatory Visit: Payer: 59 | Admitting: Family Medicine

## 2016-04-17 DIAGNOSIS — J029 Acute pharyngitis, unspecified: Secondary | ICD-10-CM | POA: Diagnosis not present

## 2016-04-23 ENCOUNTER — Ambulatory Visit (INDEPENDENT_AMBULATORY_CARE_PROVIDER_SITE_OTHER): Payer: 59 | Admitting: Family Medicine

## 2016-04-23 ENCOUNTER — Encounter: Payer: Self-pay | Admitting: Family Medicine

## 2016-04-23 VITALS — BP 128/71 | HR 105 | Ht 68.31 in | Wt 216.0 lb

## 2016-04-23 DIAGNOSIS — E7800 Pure hypercholesterolemia, unspecified: Secondary | ICD-10-CM | POA: Diagnosis not present

## 2016-04-23 DIAGNOSIS — I10 Essential (primary) hypertension: Secondary | ICD-10-CM | POA: Diagnosis not present

## 2016-04-23 LAB — LP+ALT+AST PICCOLO, WAIVED
ALT (SGPT) Piccolo, Waived: 22 U/L (ref 10–47)
AST (SGOT) Piccolo, Waived: 29 U/L (ref 11–38)
Chol/HDL Ratio Piccolo,Waive: 3.6 mg/dL
Cholesterol Piccolo, Waived: 169 mg/dL (ref <200)
HDL CHOL PICCOLO, WAIVED: 47 mg/dL — AB (ref >59)
LDL Chol Calc Piccolo Waived: 47 mg/dL (ref <100)
Triglycerides Piccolo,Waived: 374 mg/dL — ABNORMAL HIGH (ref <150)
VLDL CHOL CALC PICCOLO,WAIVE: 75 mg/dL — AB (ref <30)

## 2016-04-23 NOTE — Progress Notes (Signed)
BP 128/71   Pulse (!) 105   Ht 5' 8.31" (1.735 m)   Wt 216 lb (98 kg)   SpO2 99%   BMI 32.55 kg/m    Subjective:    Patient ID: Daniel Cook, male    DOB: [birth year], 61 y.o.   MRN: [id number]  HPI: Niklaus Direnzo is a 61 y.o. male  Chief Complaint  Patient presents with  . Follow-up   Patient follow-up high triglycerides cholesterol has been taking Lipitor and Zetia without problems. Has also been working on weight loss diet exercise nutrition. Blood pressure also doing well no problems with medications and taking faithfully. Relevant past medical, surgical, family and social history reviewed and updated as indicated. Interim medical history since our last visit reviewed. Allergies and medications reviewed and updated.  Review of Systems  Constitutional: Negative.   Respiratory: Negative.   Cardiovascular: Negative.     Per HPI unless specifically indicated above     Objective:    BP 128/71   Pulse (!) 105   Ht 5' 8.31" (1.735 m)   Wt 216 lb (98 kg)   SpO2 99%   BMI 32.55 kg/m   Wt Readings from Last 3 Encounters:  04/23/16 216 lb (98 kg)  12/26/15 217 lb (98.4 kg)  11/21/15 224 lb (101.6 kg)    Physical Exam  Constitutional: He is oriented to person, place, and time. He appears well-developed and well-nourished. No distress.  HENT:  Head: Normocephalic and atraumatic.  Right Ear: Hearing normal.  Left Ear: Hearing normal.  Nose: Nose normal.  Eyes: Conjunctivae and lids are normal. Right eye exhibits no discharge. Left eye exhibits no discharge. No scleral icterus.  Cardiovascular: Normal rate, regular rhythm and normal heart sounds.   Pulmonary/Chest: Effort normal and breath sounds normal. No respiratory distress.  Musculoskeletal: Normal range of motion.  Neurological: He is alert and oriented to person, place, and time.  Skin: Skin is intact. No rash noted.  Psychiatric: He has a normal mood and affect. His speech is normal and behavior  is normal. Judgment and thought content normal. Cognition and memory are normal.    Results for orders placed or performed in visit on 12/26/15  Microscopic Examination  Result Value Ref Range   WBC, UA 0-5 0 - 5 /hpf   RBC, UA 0-2 0 - 2 /hpf   Epithelial Cells (non renal) 0-10 0 - 10 /hpf   Mucus, UA Present Not Estab.   Bacteria, UA Few None seen/Few  CBC with Differential/Platelet  Result Value Ref Range   WBC 8.0 3.4 - 10.8 x10E3/uL   RBC 4.55 4.14 - 5.80 x10E6/uL   Hemoglobin 14.3 12.6 - 17.7 g/dL   Hematocrit 44.0 37.5 - 51.0 %   MCV 97 79 - 97 fL   MCH 31.4 26.6 - 33.0 pg   MCHC 32.5 31.5 - 35.7 g/dL   RDW 14.0 12.3 - 15.4 %   Platelets 231 150 - 379 x10E3/uL   Neutrophils 72 %   Lymphs 20 %   Monocytes 7 %   Eos 1 %   Basos 0 %   Neutrophils Absolute 5.7 1.4 - 7.0 x10E3/uL   Lymphocytes Absolute 1.6 0.7 - 3.1 x10E3/uL   Monocytes Absolute 0.6 0.1 - 0.9 x10E3/uL   EOS (ABSOLUTE) 0.1 0.0 - 0.4 x10E3/uL   Basophils Absolute 0.0 0.0 - 0.2 x10E3/uL   Immature Granulocytes 0 %   Immature Grans (Abs) 0.0 0.0 - 0.1 x10E3/uL  Comprehensive  metabolic panel  Result Value Ref Range   Glucose 117 (H) 65 - 99 mg/dL   BUN 16 6 - 24 mg/dL   Creatinine, Ser 0.89 0.76 - 1.27 mg/dL   GFR calc non Af Amer 94 >59 mL/min/1.73   GFR calc Af Amer 109 >59 mL/min/1.73   BUN/Creatinine Ratio 18 9 - 20   Sodium 139 134 - 144 mmol/L   Potassium 4.5 3.5 - 5.2 mmol/L   Chloride 97 96 - 106 mmol/L   CO2 24 18 - 29 mmol/L   Calcium 9.8 8.7 - 10.2 mg/dL   Total Protein 6.8 6.0 - 8.5 g/dL   Albumin 4.4 3.5 - 5.5 g/dL   Globulin, Total 2.4 1.5 - 4.5 g/dL   Albumin/Globulin Ratio 1.8 1.2 - 2.2   Bilirubin Total 0.5 0.0 - 1.2 mg/dL   Alkaline Phosphatase 59 39 - 117 IU/L   AST 25 0 - 40 IU/L   ALT 41 0 - 44 IU/L  Lipid Panel w/o Chol/HDL Ratio  Result Value Ref Range   Cholesterol, Total 271 (H) 100 - 199 mg/dL   Triglycerides 779 (HH) 0 - 149 mg/dL   HDL 47 >39 mg/dL   VLDL Cholesterol  Cal Comment 5 - 40 mg/dL   LDL Calculated Comment 0 - 99 mg/dL  PSA  Result Value Ref Range   Prostate Specific Ag, Serum 0.4 0.0 - 4.0 ng/mL  TSH  Result Value Ref Range   TSH 1.260 0.450 - 4.500 uIU/mL  Urinalysis, Routine w reflex microscopic (not at [facility])  Result Value Ref Range   Specific Gravity, UA 1.020 1.005 - 1.030   pH, UA 7.0 5.0 - 7.5   Color, UA Yellow Yellow   Appearance Ur Clear Clear   Leukocytes, UA Negative Negative   Protein, UA 2+ (A) Negative/Trace   Glucose, UA Negative Negative   Ketones, UA 1+ (A) Negative   RBC, UA Negative Negative   Bilirubin, UA Negative Negative   Urobilinogen, Ur 1.0 0.2 - 1.0 mg/dL   Nitrite, UA Negative Negative   Microscopic Examination See below:   Hepatitis C Antibody  Result Value Ref Range   Hep C Virus Ab <0.1 0.0 - 0.9 s/co ratio      Assessment & Plan:   Problem List Items Addressed This Visit      Cardiovascular and Mediastinum   Hypertension    The current medical regimen is effective;  continue present plan and medications.         Other   High cholesterol - Primary    Doing well with current medications and weight loss patient will continue current care and treatment.      Relevant Orders   LP+ALT+AST Piccolo, Waived       Follow up plan: Return in about 3 months (around 07/22/2016) for BMP,  Lipids, ALT, AST.

## 2016-04-23 NOTE — Assessment & Plan Note (Signed)
The current medical regimen is effective;  continue present plan and medications.  

## 2016-04-23 NOTE — Assessment & Plan Note (Signed)
Doing well with current medications and weight loss patient will continue current care and treatment.

## 2016-05-07 ENCOUNTER — Ambulatory Visit
Admission: RE | Admit: 2016-05-07 | Discharge: 2016-05-07 | Disposition: A | Discharge status: Home / Self Care | Payer: 59 | Source: Ambulatory Visit | Attending: Physical Medicine and Rehabilitation | Admitting: Physical Medicine and Rehabilitation

## 2016-05-07 DIAGNOSIS — M4726 Other spondylosis with radiculopathy, lumbar region: Secondary | ICD-10-CM | POA: Diagnosis not present

## 2016-05-07 DIAGNOSIS — M5416 Radiculopathy, lumbar region: Secondary | ICD-10-CM

## 2016-05-07 DIAGNOSIS — M545 Low back pain: Secondary | ICD-10-CM | POA: Diagnosis not present

## 2016-05-07 DIAGNOSIS — M5127 Other intervertebral disc displacement, lumbosacral region: Secondary | ICD-10-CM | POA: Insufficient documentation

## 2016-05-12 ENCOUNTER — Other Ambulatory Visit: Payer: Self-pay | Admitting: Family Medicine

## 2016-05-13 NOTE — Telephone Encounter (Signed)
Your patient 

## 2016-05-20 DIAGNOSIS — M5416 Radiculopathy, lumbar region: Secondary | ICD-10-CM | POA: Diagnosis not present

## 2016-05-20 DIAGNOSIS — M5136 Other intervertebral disc degeneration, lumbar region: Secondary | ICD-10-CM | POA: Diagnosis not present

## 2016-06-19 ENCOUNTER — Other Ambulatory Visit: Payer: Self-pay | Admitting: Family Medicine

## 2016-06-19 DIAGNOSIS — J208 Acute bronchitis due to other specified organisms: Secondary | ICD-10-CM | POA: Diagnosis not present

## 2016-06-19 DIAGNOSIS — R0689 Other abnormalities of breathing: Secondary | ICD-10-CM | POA: Diagnosis not present

## 2016-06-19 DIAGNOSIS — B9689 Other specified bacterial agents as the cause of diseases classified elsewhere: Secondary | ICD-10-CM | POA: Diagnosis not present

## 2016-06-25 ENCOUNTER — Ambulatory Visit: Payer: 59 | Admitting: Family Medicine

## 2016-07-02 ENCOUNTER — Encounter: Payer: Self-pay | Admitting: Family Medicine

## 2016-07-02 ENCOUNTER — Ambulatory Visit (INDEPENDENT_AMBULATORY_CARE_PROVIDER_SITE_OTHER): Payer: 59 | Admitting: Family Medicine

## 2016-07-02 VITALS — BP 133/80 | HR 54 | Temp 97.7°F | Wt 217.0 lb

## 2016-07-02 DIAGNOSIS — J019 Acute sinusitis, unspecified: Secondary | ICD-10-CM

## 2016-07-02 DIAGNOSIS — R5383 Other fatigue: Secondary | ICD-10-CM | POA: Diagnosis not present

## 2016-07-02 MED ORDER — AMOXICILLIN-POT CLAVULANATE 875-125 MG PO TABS: 1.0000 | ORAL_TABLET | Freq: Two times a day (BID) | ORAL | 0 refills | Status: DC

## 2016-07-02 MED ORDER — BENZONATATE 200 MG PO CAPS: 200.0000 mg | ORAL_CAPSULE | Freq: Three times a day (TID) | ORAL | 1 refills | Status: DC | PRN

## 2016-07-02 NOTE — Progress Notes (Signed)
BP 133/80   Pulse (!) 54   Temp 97.7 F (36.5 C)   Wt 217 lb (98.4 kg)   SpO2 96%   BMI 32.70 kg/m    Subjective:    Patient ID: Daniel Cook, male    DOB: [birth year], 61 y.o.   MRN: [id number]  HPI: Daniel Cook is a 61 y.o. male  Chief Complaint  Patient presents with  . wheezing and coughing   Patient's been to walk-in Center 2 weeks ago was given doxycycline helped a little bit but not that much still having a lot of sinus pressure drainage congestion. Has been taking over-the-counter medications with some help. Patient independent of cold is been having some fatigue issues at work and during the day reviewed typical day and doesn't really eat any breakfast until about 2 PM. Gets adequate rest.  Relevant past medical, surgical, family and social history reviewed and updated as indicated. Interim medical history since our last visit reviewed. Allergies and medications reviewed and updated.  Review of Systems  Constitutional: Negative.   HENT: Positive for congestion, postnasal drip, rhinorrhea, sinus pain, sinus pressure, sneezing and sore throat.   Respiratory: Positive for cough.   Cardiovascular: Negative.     Per HPI unless specifically indicated above     Objective:    BP 133/80   Pulse (!) 54   Temp 97.7 F (36.5 C)   Wt 217 lb (98.4 kg)   SpO2 96%   BMI 32.70 kg/m   Wt Readings from Last 3 Encounters:  07/02/16 217 lb (98.4 kg)  04/23/16 216 lb (98 kg)  12/26/15 217 lb (98.4 kg)    Physical Exam  Constitutional: He is oriented to person, place, and time. He appears well-developed and well-nourished.  HENT:  Head: Normocephalic and atraumatic.  Right Ear: External ear normal.  Left Ear: External ear normal.  Mouth/Throat: Oropharyngeal exudate present.  Eyes: Conjunctivae and EOM are normal.  Neck: Normal range of motion.  Cardiovascular: Normal rate, regular rhythm and normal heart sounds.   Pulmonary/Chest: Effort normal and  breath sounds normal.  Musculoskeletal: Normal range of motion.  Neurological: He is alert and oriented to person, place, and time.  Skin: No erythema.  Psychiatric: He has a normal mood and affect. His behavior is normal. Judgment and thought content normal.    Results for orders placed or performed in visit on 04/23/16  LP+ALT+AST Piccolo, [employer]  Result Value Ref Range   ALT (SGPT) Piccolo, Waived 22 10 - 47 U/L   AST (SGOT) Piccolo, Waived 29 11 - 38 U/L   Cholesterol Piccolo, Waived 169 <200 mg/dL   HDL Chol Piccolo, Waived 47 (L) >59 mg/dL   Triglycerides Piccolo,Waived 374 (H) <150 mg/dL   Chol/HDL Ratio Piccolo,Waive 3.6 mg/dL   LDL Chol Calc Piccolo Waived 47 <100 mg/dL   VLDL Chol Calc Piccolo,Waive 75 (H) <30 mg/dL      Assessment & Plan:   Problem List Items Addressed This Visit      Other   Fatigue    Discussed lifestyle modification eating a little light breakfast eaten a pre-lunch snack       Other Visit Diagnoses    Acute sinusitis, recurrence not specified, unspecified location    -  Primary   Discussed sinusitis care and treatment use of Augmentin and minimizing side effects. Use of Mucinex Tylenol sinus etc. nasal rinse.   Relevant Medications   amoxicillin-clavulanate (AUGMENTIN) 875-125 MG tablet   benzonatate (TESSALON) 200  MG capsule       Follow up plan: Return if symptoms worsen or fail to improve, for As scheduled.

## 2016-07-02 NOTE — Assessment & Plan Note (Signed)
Discussed lifestyle modification eating a little light breakfast eaten a pre-lunch snack

## 2016-07-23 DIAGNOSIS — M5416 Radiculopathy, lumbar region: Secondary | ICD-10-CM | POA: Diagnosis not present

## 2016-07-23 DIAGNOSIS — M5136 Other intervertebral disc degeneration, lumbar region: Secondary | ICD-10-CM | POA: Diagnosis not present

## 2016-08-06 ENCOUNTER — Ambulatory Visit: Payer: 59 | Admitting: Family Medicine

## 2016-08-13 ENCOUNTER — Other Ambulatory Visit: Payer: Self-pay | Admitting: Family Medicine

## 2016-08-13 NOTE — Telephone Encounter (Signed)
Your patient 

## 2016-08-20 DIAGNOSIS — M5416 Radiculopathy, lumbar region: Secondary | ICD-10-CM | POA: Diagnosis not present

## 2016-08-27 ENCOUNTER — Encounter: Payer: Self-pay | Admitting: Family Medicine

## 2016-08-27 ENCOUNTER — Ambulatory Visit (INDEPENDENT_AMBULATORY_CARE_PROVIDER_SITE_OTHER): Payer: 59 | Admitting: Family Medicine

## 2016-08-27 VITALS — BP 130/60 | HR 57 | Ht 68.0 in | Wt 223.0 lb

## 2016-08-27 DIAGNOSIS — M1000 Idiopathic gout, unspecified site: Secondary | ICD-10-CM

## 2016-08-27 DIAGNOSIS — M5136 Other intervertebral disc degeneration, lumbar region: Secondary | ICD-10-CM | POA: Diagnosis not present

## 2016-08-27 DIAGNOSIS — E7800 Pure hypercholesterolemia, unspecified: Secondary | ICD-10-CM

## 2016-08-27 DIAGNOSIS — I10 Essential (primary) hypertension: Secondary | ICD-10-CM

## 2016-08-27 LAB — LP+ALT+AST PICCOLO, WAIVED
ALT (SGPT) Piccolo, Waived: 29 U/L (ref 10–47)
AST (SGOT) Piccolo, Waived: 30 U/L (ref 11–38)
CHOLESTEROL PICCOLO, WAIVED: 196 mg/dL (ref <200)
Chol/HDL Ratio Piccolo,Waive: 3.1 mg/dL
HDL Chol Piccolo, Waived: 63 mg/dL (ref >59)
LDL CHOL CALC PICCOLO WAIVED: 104 mg/dL — AB (ref <100)
Triglycerides Piccolo,Waived: 145 mg/dL (ref <150)
VLDL Chol Calc Piccolo,Waive: 29 mg/dL (ref <30)

## 2016-08-27 MED ORDER — EZETIMIBE 10 MG PO TABS: 10.0000 mg | ORAL_TABLET | Freq: Every day | ORAL | 1 refills | Status: DC

## 2016-08-27 MED ORDER — LOSARTAN POTASSIUM 100 MG PO TABS: 100.0000 mg | ORAL_TABLET | Freq: Every day | ORAL | 1 refills | Status: DC

## 2016-08-27 MED ORDER — ATORVASTATIN CALCIUM 40 MG PO TABS: 40.0000 mg | ORAL_TABLET | Freq: Every day | ORAL | 1 refills | Status: DC

## 2016-08-27 MED ORDER — ALLOPURINOL 300 MG PO TABS: 300.0000 mg | ORAL_TABLET | Freq: Every day | ORAL | 1 refills | Status: DC

## 2016-08-27 MED ORDER — AMLODIPINE BESYLATE 10 MG PO TABS: 10.0000 mg | ORAL_TABLET | Freq: Every day | ORAL | 1 refills | Status: DC

## 2016-08-27 NOTE — Assessment & Plan Note (Signed)
The current medical regimen is effective;  continue present plan and medications.  

## 2016-08-27 NOTE — Assessment & Plan Note (Signed)
Going to pain clinic for care and doing strengthening exercises.

## 2016-08-27 NOTE — Progress Notes (Signed)
BP 130/60 (BP Location: Left Arm)   Pulse (!) 57   Ht 5\' 8"  (1.727 m)   Wt 223 lb (101.2 kg)   SpO2 91%   BMI 33.91 kg/m    Subjective:    Patient ID: Daniel Cook, male    DOB: 03-17-1956, 61 y.o.   MRN: [id number]  HPI: Daniel Cook is a 61 y.o. male  Chief Complaint  Patient presents with  . Follow-up  . Hyperlipidemia   Patient follow-up L with medications no issues taking cholesterol medicines without problems and blood pressure medicines without problems working with pain clinic and started physical therapy and yoga working on core muscle groups. Gout is doing well and under control. Doing okay with gabapentin  Relevant past medical, surgical, family and social history reviewed and updated as indicated. Interim medical history since our last visit reviewed. Allergies and medications reviewed and updated.  Review of Systems  Constitutional: Negative.   Respiratory: Negative.   Cardiovascular: Negative.     Per HPI unless specifically indicated above     Objective:    BP 130/60 (BP Location: Left Arm)   Pulse (!) 57   Ht 5\' 8"  (1.727 m)   Wt 223 lb (101.2 kg)   SpO2 91%   BMI 33.91 kg/m   Wt Readings from Last 3 Encounters:  08/27/16 223 lb (101.2 kg)  07/02/16 217 lb (98.4 kg)  04/23/16 216 lb (98 kg)    Physical Exam  Constitutional: He is oriented to person, place, and time. He appears well-developed and well-nourished.  HENT:  Head: Normocephalic and atraumatic.  Eyes: Conjunctivae and EOM are normal.  Neck: Normal range of motion.  Cardiovascular: Normal rate, regular rhythm and normal heart sounds.   Pulmonary/Chest: Effort normal and breath sounds normal.  Musculoskeletal: Normal range of motion.  Neurological: He is alert and oriented to person, place, and time.  Skin: No erythema.  Psychiatric: He has a normal mood and affect. His behavior is normal. Judgment and thought content normal.    Results for orders placed or  performed in visit on 04/23/16  LP+ALT+AST Piccolo, [employer]  Result Value Ref Range   ALT (SGPT) Piccolo, Waived 22 10 - 47 U/L   AST (SGOT) Piccolo, Waived 29 11 - 38 U/L   Cholesterol Piccolo, Waived 169 <200 mg/dL   HDL Chol Piccolo, Waived 47 (L) >59 mg/dL   Triglycerides Piccolo,Waived 374 (H) <150 mg/dL   Chol/HDL Ratio Piccolo,Waive 3.6 mg/dL   LDL Chol Calc Piccolo Waived 47 <100 mg/dL   VLDL Chol Calc Piccolo,Waive 75 (H) <30 mg/dL      Assessment & Plan:   Problem List Items Addressed This Visit      Cardiovascular and Mediastinum   Hypertension - Primary    The current medical regimen is effective;  continue present plan and medications.       Relevant Medications   amLODipine (NORVASC) 10 MG tablet   atorvastatin (LIPITOR) 40 MG tablet   ezetimibe (ZETIA) 10 MG tablet   losartan (COZAAR) 100 MG tablet   Other Relevant Orders   Basic metabolic panel   LP+ALT+AST Piccolo, Waived     Musculoskeletal and Integument   DDD (degenerative disc disease), lumbar    Going to pain clinic for care and doing strengthening exercises.      Relevant Medications   allopurinol (ZYLOPRIM) 300 MG tablet     Other   High cholesterol    The current medical regimen is effective;  continue present plan and medications.       Relevant Medications   amLODipine (NORVASC) 10 MG tablet   atorvastatin (LIPITOR) 40 MG tablet   ezetimibe (ZETIA) 10 MG tablet   losartan (COZAAR) 100 MG tablet   Other Relevant Orders   Basic metabolic panel   LP+ALT+AST Piccolo, Waived   Gout    The current medical regimen is effective;  continue present plan and medications.           Follow up plan: Return for Physical Exam, Sept.

## 2016-08-28 ENCOUNTER — Encounter: Payer: Self-pay | Admitting: Family Medicine

## 2016-08-28 LAB — BASIC METABOLIC PANEL
BUN/Creatinine Ratio: 21 — ABNORMAL HIGH (ref 9–20)
BUN: 17 mg/dL (ref 6–24)
CALCIUM: 9.3 mg/dL (ref 8.7–10.2)
CHLORIDE: 103 mmol/L (ref 96–106)
CO2: 24 mmol/L (ref 18–29)
Creatinine, Ser: 0.82 mg/dL (ref 0.76–1.27)
GFR calc Af Amer: 112 mL/min/{1.73_m2} (ref >59)
GFR calc non Af Amer: 97 mL/min/{1.73_m2} (ref >59)
GLUCOSE: 119 mg/dL — AB (ref 65–99)
POTASSIUM: 4.4 mmol/L (ref 3.5–5.2)
Sodium: 142 mmol/L (ref 134–144)

## 2016-10-01 DIAGNOSIS — G4733 Obstructive sleep apnea (adult) (pediatric): Secondary | ICD-10-CM | POA: Diagnosis not present

## 2016-10-01 DIAGNOSIS — E782 Mixed hyperlipidemia: Secondary | ICD-10-CM | POA: Diagnosis not present

## 2016-10-01 DIAGNOSIS — I10 Essential (primary) hypertension: Secondary | ICD-10-CM | POA: Diagnosis not present

## 2016-10-15 DIAGNOSIS — M5416 Radiculopathy, lumbar region: Secondary | ICD-10-CM | POA: Diagnosis not present

## 2016-10-15 DIAGNOSIS — M5136 Other intervertebral disc degeneration, lumbar region: Secondary | ICD-10-CM | POA: Diagnosis not present

## 2016-11-07 ENCOUNTER — Other Ambulatory Visit: Payer: Self-pay | Admitting: Family Medicine

## 2016-12-27 ENCOUNTER — Telehealth: Payer: Self-pay | Admitting: Family Medicine

## 2016-12-30 NOTE — Telephone Encounter (Signed)
Left message on machine for pt to return call to the office.  

## 2016-12-31 NOTE — Telephone Encounter (Signed)
Left message on machine for pt to return call to the office.  

## 2017-01-01 ENCOUNTER — Encounter: Payer: 59 | Admitting: Family Medicine

## 2017-01-01 NOTE — Telephone Encounter (Signed)
Left message on machine for pt to return call to the office. Will close until pt returns call.

## 2017-01-07 ENCOUNTER — Other Ambulatory Visit: Payer: Self-pay

## 2017-01-07 MED ORDER — AMLODIPINE BESYLATE 10 MG PO TABS: 10.0000 mg | ORAL_TABLET | Freq: Every day | ORAL | 1 refills | Status: DC

## 2017-01-07 MED ORDER — LOSARTAN POTASSIUM 100 MG PO TABS: 100.0000 mg | ORAL_TABLET | Freq: Every day | ORAL | 1 refills | Status: DC

## 2017-01-07 MED ORDER — ATORVASTATIN CALCIUM 40 MG PO TABS: 40.0000 mg | ORAL_TABLET | Freq: Every day | ORAL | 1 refills | Status: DC

## 2017-01-07 MED ORDER — ALLOPURINOL 300 MG PO TABS: 300.0000 mg | ORAL_TABLET | Freq: Every day | ORAL | 1 refills | Status: DC

## 2017-01-07 NOTE — Telephone Encounter (Signed)
Routing to provider. Needs 90 day supplies. Appt on 02/04/17.

## 2017-01-31 ENCOUNTER — Other Ambulatory Visit: Payer: Self-pay | Admitting: Family Medicine

## 2017-02-02 ENCOUNTER — Other Ambulatory Visit: Payer: Self-pay | Admitting: Family Medicine

## 2017-02-04 ENCOUNTER — Ambulatory Visit (INDEPENDENT_AMBULATORY_CARE_PROVIDER_SITE_OTHER): Payer: 59 | Admitting: Family Medicine

## 2017-02-04 VITALS — BP 127/75 | HR 56 | Ht 68.11 in | Wt 218.0 lb

## 2017-02-04 DIAGNOSIS — Z23 Encounter for immunization: Secondary | ICD-10-CM | POA: Diagnosis not present

## 2017-02-04 DIAGNOSIS — I10 Essential (primary) hypertension: Secondary | ICD-10-CM | POA: Diagnosis not present

## 2017-02-04 DIAGNOSIS — Z0000 Encounter for general adult medical examination without abnormal findings: Secondary | ICD-10-CM | POA: Diagnosis not present

## 2017-02-04 DIAGNOSIS — E7800 Pure hypercholesterolemia, unspecified: Secondary | ICD-10-CM

## 2017-02-04 DIAGNOSIS — M1000 Idiopathic gout, unspecified site: Secondary | ICD-10-CM

## 2017-02-04 DIAGNOSIS — R5383 Other fatigue: Secondary | ICD-10-CM | POA: Diagnosis not present

## 2017-02-04 DIAGNOSIS — M5136 Other intervertebral disc degeneration, lumbar region: Secondary | ICD-10-CM

## 2017-02-04 MED ORDER — OMEPRAZOLE 20 MG PO CPDR: 20.0000 mg | DELAYED_RELEASE_CAPSULE | Freq: Every day | ORAL | 4 refills | Status: DC

## 2017-02-04 MED ORDER — NAPROXEN 500 MG PO TABS: 500.0000 mg | ORAL_TABLET | Freq: Two times a day (BID) | ORAL | 4 refills | Status: DC

## 2017-02-04 MED ORDER — GABAPENTIN 300 MG PO CAPS
ORAL_CAPSULE | ORAL | 4 refills | Status: DC
Start: 2017-02-04 — End: 2018-03-03

## 2017-02-04 MED ORDER — EZETIMIBE 10 MG PO TABS: 10.0000 mg | ORAL_TABLET | Freq: Every day | ORAL | 4 refills | Status: DC

## 2017-02-04 MED ORDER — ATORVASTATIN CALCIUM 40 MG PO TABS: 40.0000 mg | ORAL_TABLET | Freq: Every day | ORAL | 4 refills | Status: DC

## 2017-02-04 MED ORDER — LOSARTAN POTASSIUM 100 MG PO TABS: 100.0000 mg | ORAL_TABLET | Freq: Every day | ORAL | 4 refills | Status: DC

## 2017-02-04 MED ORDER — ALLOPURINOL 300 MG PO TABS: 300.0000 mg | ORAL_TABLET | Freq: Every day | ORAL | 4 refills | Status: DC

## 2017-02-04 MED ORDER — AMLODIPINE BESYLATE 10 MG PO TABS: 10.0000 mg | ORAL_TABLET | Freq: Every day | ORAL | 4 refills | Status: DC

## 2017-02-04 NOTE — Assessment & Plan Note (Signed)
The current medical regimen is effective;  continue present plan and medications.  

## 2017-02-04 NOTE — Progress Notes (Signed)
BP 127/75   Pulse (!) 56   Ht 5' 8.11" (1.73 m)   Wt 218 lb (98.9 kg)   SpO2 96%   BMI 33.04 kg/m    Subjective:    Patient ID: Daniel Cook, male    DOB: May 23, 1955, 61 y.o.   MRN: [id number]  HPI: Jerel Sardina is a 61 y.o. male  Chief Complaint  Patient presents with  . Annual Exam  Patient for physical exam also with medical problems most prominent is fatigue patient has CPAP  that he uses faithfully. Gets 8+ hours of sleep eats a regular diet. Otherwise doing well with medication. Takes blood pressure medicines without problems and good control does have some allergy type symptoms of some nasal congestion drainage especially in the morning and some occasional coughing. Other issues cholesterol neuropathy stable has back issues is seeing physiatry and has hydrocodone prescribed. No gout symptoms doing okay with allopurinol.  Relevant past medical, surgical, family and social history reviewed and updated as indicated. Interim medical history since our last visit reviewed. Allergies and medications reviewed and updated.  Review of Systems  Constitutional: Negative.   HENT: Negative.   Eyes: Negative.   Respiratory: Negative.   Cardiovascular: Negative.   Gastrointestinal: Negative.   Endocrine: Negative.   Genitourinary: Negative.   Musculoskeletal: Negative.   Skin: Negative.   Allergic/Immunologic: Negative.   Neurological: Negative.   Hematological: Negative.   Psychiatric/Behavioral: Negative.     Per HPI unless specifically indicated above     Objective:    BP 127/75   Pulse (!) 56   Ht 5' 8.11" (1.73 m)   Wt 218 lb (98.9 kg)   SpO2 96%   BMI 33.04 kg/m   Wt Readings from Last 3 Encounters:  02/04/17 218 lb (98.9 kg)  08/27/16 223 lb (101.2 kg)  07/02/16 217 lb (98.4 kg)    Physical Exam  Constitutional: He is oriented to person, place, and time. He appears well-developed and well-nourished.  HENT:  Head: Normocephalic and atraumatic.   Right Ear: External ear normal.  Left Ear: External ear normal.  Eyes: Pupils are equal, round, and reactive to light. Conjunctivae and EOM are normal.  Neck: Normal range of motion. Neck supple.  Cardiovascular: Normal rate, regular rhythm, normal heart sounds and intact distal pulses.   Pulmonary/Chest: Effort normal and breath sounds normal.  Abdominal: Soft. Bowel sounds are normal. There is no splenomegaly or hepatomegaly.  Genitourinary: Rectum normal, prostate normal and penis normal.  Musculoskeletal: Normal range of motion.  Neurological: He is alert and oriented to person, place, and time. He has normal reflexes.  Skin: No rash noted. No erythema.  Psychiatric: He has a normal mood and affect. His behavior is normal. Judgment and thought content normal.    Results for orders placed or performed in visit on 03/50/09  Basic metabolic panel  Result Value Ref Range   Glucose 119 (H) 65 - 99 mg/dL   BUN 17 6 - 24 mg/dL   Creatinine, Ser 0.82 0.76 - 1.27 mg/dL   GFR calc non Af Amer 97 >59 mL/min/1.73   GFR calc Af Amer 112 >59 mL/min/1.73   BUN/Creatinine Ratio 21 (H) 9 - 20   Sodium 142 134 - 144 mmol/L   Potassium 4.4 3.5 - 5.2 mmol/L   Chloride 103 96 - 106 mmol/L   CO2 24 18 - 29 mmol/L   Calcium 9.3 8.7 - 10.2 mg/dL  LP+ALT+AST Piccolo, Waived  Result Value Ref  Range   ALT (SGPT) Piccolo, Waived 29 10 - 47 U/L   AST (SGOT) Piccolo, Waived 30 11 - 38 U/L   Cholesterol Piccolo, Waived 196 <200 mg/dL   HDL Chol Piccolo, Waived 63 >59 mg/dL   Triglycerides Piccolo,Waived 145 <150 mg/dL   Chol/HDL Ratio Piccolo,Waive 3.1 mg/dL   LDL Chol Calc Piccolo Waived 104 (H) <100 mg/dL   VLDL Chol Calc Piccolo,Waive 29 <30 mg/dL      Assessment & Plan:   Problem List Items Addressed This Visit      Cardiovascular and Mediastinum   Hypertension    The current medical regimen is effective;  continue present plan and medications.       Relevant Medications   amLODipine  (NORVASC) 10 MG tablet   atorvastatin (LIPITOR) 40 MG tablet   ezetimibe (ZETIA) 10 MG tablet   losartan (COZAAR) 100 MG tablet   Other Relevant Orders   CBC with Differential/Platelet   Comprehensive metabolic panel   Urinalysis, Routine w reflex microscopic     Musculoskeletal and Integument   DDD (degenerative disc disease), lumbar    Has medication and adequately controlled      Relevant Medications   gabapentin (NEURONTIN) 300 MG capsule   allopurinol (ZYLOPRIM) 300 MG tablet   naproxen (NAPROSYN) 500 MG tablet     Other   High cholesterol    The current medical regimen is effective;  continue present plan and medications.       Relevant Medications   amLODipine (NORVASC) 10 MG tablet   atorvastatin (LIPITOR) 40 MG tablet   ezetimibe (ZETIA) 10 MG tablet   losartan (COZAAR) 100 MG tablet   Other Relevant Orders   Lipid panel   Gout    The current medical regimen is effective;  continue present plan and medications.       Relevant Medications   allopurinol (ZYLOPRIM) 300 MG tablet   naproxen (NAPROSYN) 500 MG tablet   Other Relevant Orders   Uric acid   Fatigue    Patient was found using the special brand of vinegar seems to help and will continue      Relevant Orders   Comprehensive metabolic panel    Other Visit Diagnoses    Needs flu shot    -  Primary   Relevant Orders   Flu Vaccine QUAD 36+ mos IM (Completed)   PE (physical exam), annual       Relevant Orders   CBC with Differential/Platelet   Comprehensive metabolic panel   Lipid panel   PSA   TSH   Urinalysis, Routine w reflex microscopic   Uric acid       Follow up plan: Return in about 6 months (around 08/05/2017) for BMP,  Lipids, ALT, AST.

## 2017-02-04 NOTE — Assessment & Plan Note (Signed)
Has medication and adequately controlled

## 2017-02-04 NOTE — Patient Instructions (Signed)

## 2017-02-04 NOTE — Assessment & Plan Note (Signed)
Patient was found using the special brand of vinegar seems to help and will continue

## 2017-02-05 LAB — CBC WITH DIFFERENTIAL/PLATELET
Basophils Absolute: 0 10*3/uL (ref 0.0–0.2)
Basos: 0 %
EOS (ABSOLUTE): 0.1 10*3/uL (ref 0.0–0.4)
EOS: 1 %
HEMATOCRIT: 40.3 % (ref 37.5–51.0)
Hemoglobin: 12.9 g/dL — ABNORMAL LOW (ref 13.0–17.7)
IMMATURE GRANULOCYTES: 0 %
Immature Grans (Abs): 0 10*3/uL (ref 0.0–0.1)
Lymphocytes Absolute: 1.3 10*3/uL (ref 0.7–3.1)
Lymphs: 16 %
MCH: 30.7 pg (ref 26.6–33.0)
MCHC: 32 g/dL (ref 31.5–35.7)
MCV: 96 fL (ref 79–97)
MONOS ABS: 0.3 10*3/uL (ref 0.1–0.9)
Monocytes: 4 %
NEUTROS PCT: 79 %
Neutrophils Absolute: 6.3 10*3/uL (ref 1.4–7.0)
Platelets: 255 10*3/uL (ref 150–379)
RBC: 4.2 x10E6/uL (ref 4.14–5.80)
RDW: 14.8 % (ref 12.3–15.4)
WBC: 8.1 10*3/uL (ref 3.4–10.8)

## 2017-02-05 LAB — COMPREHENSIVE METABOLIC PANEL
ALBUMIN: 4.6 g/dL (ref 3.5–5.5)
ALT: 22 IU/L (ref 0–44)
AST: 15 IU/L (ref 0–40)
Albumin/Globulin Ratio: 2.2 (ref 1.2–2.2)
Alkaline Phosphatase: 54 IU/L (ref 39–117)
BUN / CREAT RATIO: 21 — AB (ref 9–20)
BUN: 17 mg/dL (ref 6–24)
Bilirubin Total: 0.5 mg/dL (ref 0.0–1.2)
CALCIUM: 9.5 mg/dL (ref 8.7–10.2)
CO2: 25 mmol/L (ref 20–29)
CREATININE: 0.82 mg/dL (ref 0.76–1.27)
Chloride: 105 mmol/L (ref 96–106)
GFR, EST AFRICAN AMERICAN: 112 mL/min/{1.73_m2} (ref >59)
GFR, EST NON AFRICAN AMERICAN: 97 mL/min/{1.73_m2} (ref >59)
GLOBULIN, TOTAL: 2.1 g/dL (ref 1.5–4.5)
Glucose: 116 mg/dL — ABNORMAL HIGH (ref 65–99)
Potassium: 4.3 mmol/L (ref 3.5–5.2)
SODIUM: 143 mmol/L (ref 134–144)
TOTAL PROTEIN: 6.7 g/dL (ref 6.0–8.5)

## 2017-02-05 LAB — LIPID PANEL
CHOL/HDL RATIO: 3.9 ratio (ref 0.0–5.0)
Cholesterol, Total: 203 mg/dL — ABNORMAL HIGH (ref 100–199)
HDL: 52 mg/dL (ref >39)
LDL CALC: 106 mg/dL — AB (ref 0–99)
TRIGLYCERIDES: 224 mg/dL — AB (ref 0–149)
VLDL Cholesterol Cal: 45 mg/dL — ABNORMAL HIGH (ref 5–40)

## 2017-02-05 LAB — URIC ACID: Uric Acid: 5.2 mg/dL (ref 3.7–8.6)

## 2017-02-05 LAB — TSH: TSH: 1.27 u[IU]/mL (ref 0.450–4.500)

## 2017-02-05 LAB — PSA: PROSTATE SPECIFIC AG, SERUM: 0.4 ng/mL (ref 0.0–4.0)

## 2017-02-06 ENCOUNTER — Telehealth: Payer: Self-pay | Admitting: Family Medicine

## 2017-02-06 DIAGNOSIS — D649 Anemia, unspecified: Secondary | ICD-10-CM

## 2017-02-06 LAB — URINALYSIS, ROUTINE W REFLEX MICROSCOPIC
BILIRUBIN UA: NEGATIVE
Glucose, UA: NEGATIVE
LEUKOCYTES UA: NEGATIVE
Nitrite, UA: NEGATIVE
RBC UA: NEGATIVE
Specific Gravity, UA: 1.03 (ref 1.005–1.030)
UUROB: 0.2 mg/dL (ref 0.2–1.0)
pH, UA: 5.5 (ref 5.0–7.5)

## 2017-02-06 LAB — MICROSCOPIC EXAMINATION

## 2017-02-06 NOTE — Telephone Encounter (Signed)
Copied from [place] #[id number]. Topic: Quick Communication - Lab Results >> Feb 06, 2017 11:26 AM Robina Ade, Helene Kelp D wrote: Patient called and would like his lab results. Please call patient back, thanks.

## 2017-02-06 NOTE — Telephone Encounter (Signed)
Phone call Discussed with patient CBC slightly low will recheck CBC to assess function.

## 2017-02-12 ENCOUNTER — Other Ambulatory Visit: Payer: 59

## 2017-02-12 DIAGNOSIS — D649 Anemia, unspecified: Secondary | ICD-10-CM | POA: Diagnosis not present

## 2017-02-13 LAB — CBC WITH DIFFERENTIAL/PLATELET
BASOS ABS: 0.1 10*3/uL (ref 0.0–0.2)
Basos: 1 %
EOS (ABSOLUTE): 0.1 10*3/uL (ref 0.0–0.4)
Eos: 1 %
HEMOGLOBIN: 13.1 g/dL (ref 13.0–17.7)
Hematocrit: 39.6 % (ref 37.5–51.0)
IMMATURE GRANS (ABS): 0 10*3/uL (ref 0.0–0.1)
IMMATURE GRANULOCYTES: 0 %
LYMPHS: 21 %
Lymphocytes Absolute: 1.7 10*3/uL (ref 0.7–3.1)
MCH: 31.7 pg (ref 26.6–33.0)
MCHC: 33.1 g/dL (ref 31.5–35.7)
MCV: 96 fL (ref 79–97)
MONOCYTES: 5 %
Monocytes Absolute: 0.4 10*3/uL (ref 0.1–0.9)
Neutrophils Absolute: 5.9 10*3/uL (ref 1.4–7.0)
Neutrophils: 72 %
Platelets: 243 10*3/uL (ref 150–379)
RBC: 4.13 x10E6/uL — AB (ref 4.14–5.80)
RDW: 15.2 % (ref 12.3–15.4)
WBC: 8.1 10*3/uL (ref 3.4–10.8)

## 2017-02-20 ENCOUNTER — Telehealth: Payer: Self-pay | Admitting: Family Medicine

## 2017-02-20 DIAGNOSIS — Z719 Counseling, unspecified: Secondary | ICD-10-CM | POA: Diagnosis not present

## 2017-02-20 NOTE — Telephone Encounter (Signed)
Written by Valerie Roys, DO on 02/13/2017 1:17 PM  Mr. Kozar, your blood count is back to normal. Have a great day!

## 2017-02-20 NOTE — Telephone Encounter (Signed)
Message left for patient to let know labs were normal.

## 2017-02-20 NOTE — Telephone Encounter (Signed)
Copied from [place] [id number]. Topic: Quick Communication - See Telephone Encounter >> Feb 20, 2017  2:18 PM Ivar Drape wrote: CRM for notification. See Telephone encounter for:  02/20/17. Patient would like the results for the labs he took on 02/12/17.

## 2017-03-17 ENCOUNTER — Telehealth: Payer: Self-pay | Admitting: Family Medicine

## 2017-03-17 NOTE — Telephone Encounter (Signed)
Copied from [place] [phone]. Topic: Inquiry >> Mar 17, 2017 11:38 AM Oliver Pila B wrote: Reason for CRM: phylicia from [email or web address] called to see if the fax has been received about c pap mask, contact phylicia [phone]

## 2017-03-18 NOTE — Telephone Encounter (Signed)
Copied from [place] [phone]. Topic: Inquiry >> Mar 17, 2017 11:38 AM Oliver Pila B wrote: Reason for CRM: phylicia from [email or web address] called to see if the fax has been received about c pap mask, contact phylicia [phone]  >> Mar 18, 2017  2:44 PM Conception Chancy, NT wrote: Pt is calling to let the office know that the cpap machine request he put in, and it is something he needs. Stated they were waiting on conformation from the office.

## 2017-03-18 NOTE — Telephone Encounter (Deleted)
Copied from [place] [phone]. Topic: Inquiry >> Mar 17, 2017 11:38 AM Oliver Pila B wrote: Reason for CRM: phylicia from [email or web address] called to see if the fax has been received about c pap mask, contact phylicia [phone]

## 2017-03-20 NOTE — Telephone Encounter (Signed)
Have not seen fax. Attempted to contact phylicia

## 2017-03-21 NOTE — Telephone Encounter (Signed)
Pt called to find out status fax from [email or web address]. Pt is concerned if not addressed the rx will "cancel".  NT called [email or web address] and spoke to [facility] @ [phone] and she is sending the RX request for CPAP again now.

## 2017-03-24 NOTE — Telephone Encounter (Signed)
Fax never received. Called [email or web address] and asked what they needed. Requested an RX for a mask faxed to either [phone] or [phone]. Placed on provider's box for signature then will fax.

## 2017-03-25 DIAGNOSIS — I10 Essential (primary) hypertension: Secondary | ICD-10-CM | POA: Diagnosis not present

## 2017-03-25 DIAGNOSIS — G4733 Obstructive sleep apnea (adult) (pediatric): Secondary | ICD-10-CM | POA: Diagnosis not present

## 2017-03-25 DIAGNOSIS — E782 Mixed hyperlipidemia: Secondary | ICD-10-CM | POA: Diagnosis not present

## 2017-03-25 NOTE — Telephone Encounter (Signed)
Order received through fax, signed by Dr. Jeananne Rama, and faxed back.

## 2017-04-15 DIAGNOSIS — M5416 Radiculopathy, lumbar region: Secondary | ICD-10-CM | POA: Diagnosis not present

## 2017-04-15 DIAGNOSIS — M5136 Other intervertebral disc degeneration, lumbar region: Secondary | ICD-10-CM | POA: Diagnosis not present

## 2017-06-18 ENCOUNTER — Telehealth: Payer: Self-pay | Admitting: Family Medicine

## 2017-06-18 DIAGNOSIS — I10 Essential (primary) hypertension: Secondary | ICD-10-CM

## 2017-06-18 MED ORDER — LOSARTAN POTASSIUM 100 MG PO TABS: 100.0000 mg | ORAL_TABLET | Freq: Every day | ORAL | 0 refills | Status: DC

## 2017-06-18 NOTE — Telephone Encounter (Signed)
30 day supply sent

## 2017-06-18 NOTE — Telephone Encounter (Signed)
Routing to provider in office.

## 2017-06-18 NOTE — Telephone Encounter (Signed)
Patient is completely out of his [place] due to somehow the normal auto refill did not happen.   He said he knows it is on the way but will need to have an emergency supply sent in to [facility] on [address]  Thank you

## 2017-06-18 NOTE — Telephone Encounter (Signed)
Called and left patient a VM letting him know that his RX was sent in as requested.

## 2017-08-05 ENCOUNTER — Ambulatory Visit: Payer: 59 | Admitting: Family Medicine

## 2017-08-05 ENCOUNTER — Encounter: Payer: Self-pay | Admitting: Family Medicine

## 2017-08-05 VITALS — BP 130/76 | HR 50 | Ht 68.0 in | Wt 222.0 lb

## 2017-08-05 DIAGNOSIS — I10 Essential (primary) hypertension: Secondary | ICD-10-CM

## 2017-08-05 DIAGNOSIS — G4730 Sleep apnea, unspecified: Secondary | ICD-10-CM | POA: Diagnosis not present

## 2017-08-05 DIAGNOSIS — E7800 Pure hypercholesterolemia, unspecified: Secondary | ICD-10-CM

## 2017-08-05 DIAGNOSIS — M1000 Idiopathic gout, unspecified site: Secondary | ICD-10-CM

## 2017-08-05 LAB — LP+ALT+AST PICCOLO, WAIVED
ALT (SGPT) Piccolo, Waived: 31 U/L (ref 10–47)
AST (SGOT) Piccolo, Waived: 30 U/L (ref 11–38)
Chol/HDL Ratio Piccolo,Waive: 3.7 mg/dL
Cholesterol Piccolo, Waived: 184 mg/dL (ref <200)
HDL Chol Piccolo, Waived: 49 mg/dL — ABNORMAL LOW (ref >59)
LDL CHOL CALC PICCOLO WAIVED: 85 mg/dL (ref <100)
Triglycerides Piccolo,Waived: 247 mg/dL — ABNORMAL HIGH (ref <150)
VLDL Chol Calc Piccolo,Waive: 49 mg/dL — ABNORMAL HIGH (ref <30)

## 2017-08-05 NOTE — Assessment & Plan Note (Signed)
The current medical regimen is effective;  continue present plan and medications.  

## 2017-08-05 NOTE — Progress Notes (Signed)
BP 130/76   Pulse (!) 50   Ht 5\' 8"  (1.727 m)   Wt 222 lb (100.7 kg)   SpO2 98%   BMI 33.75 kg/m    Subjective:    Patient ID: Daniel Cook, male    DOB: 1955/04/17, 62 y.o.   MRN: [id number]  HPI: Daniel Cook is a 62 y.o. male  Chief Complaint  Patient presents with  . Hypertension  . Follow-up  Hypercholesterol doing well taking both medications without problems. Blood pressure doing well with no complaints taking medication without issues. Same for gout no problems or issues has not needed colchicine for some time. Taking gabapentin for neuropathy and neck and back which is stable.  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  Has an appointment with physichiatry coming up.  Relevant past medical, surgical, family and social history reviewed and updated as indicated. Interim medical history since our last visit reviewed. Allergies and medications reviewed and updated.  Review of Systems  Constitutional: Negative.   Respiratory: Negative.   Cardiovascular: Negative.     Per HPI unless specifically indicated above     Objective:    BP 130/76   Pulse (!) 50   Ht 5\' 8"  (1.727 m)   Wt 222 lb (100.7 kg)   SpO2 98%   BMI 33.75 kg/m   Wt Readings from Last 3 Encounters:  08/05/17 222 lb (100.7 kg)  02/04/17 218 lb (98.9 kg)  08/27/16 223 lb (101.2 kg)    Physical Exam  Constitutional: He is oriented to person, place, and time. He appears well-developed and well-nourished.  HENT:  Head: Normocephalic and atraumatic.  Eyes: Conjunctivae and EOM are normal.  Neck: Normal range of motion.  Cardiovascular: Normal rate, regular rhythm and normal heart sounds.  Pulmonary/Chest: Effort normal and breath sounds normal.  Musculoskeletal: Normal range of motion.  Neurological: He is alert and oriented to person, place, and time.  Skin: No erythema.  Psychiatric: He has a normal mood and affect. His behavior  is normal. Judgment and thought content normal.    Results for orders placed or performed in visit on 02/12/17  CBC with Differential/Platelet  Result Value Ref Range   WBC 8.1 3.4 - 10.8 x10E3/uL   RBC 4.13 (L) 4.14 - 5.80 x10E6/uL   Hemoglobin 13.1 13.0 - 17.7 g/dL   Hematocrit 39.6 37.5 - 51.0 %   MCV 96 79 - 97 fL   MCH 31.7 26.6 - 33.0 pg   MCHC 33.1 31.5 - 35.7 g/dL   RDW 15.2 12.3 - 15.4 %   Platelets 243 150 - 379 x10E3/uL   Neutrophils 72 Not Estab. %   Lymphs 21 Not Estab. %   Monocytes 5 Not Estab. %   Eos 1 Not Estab. %   Basos 1 Not Estab. %   Neutrophils Absolute 5.9 1.4 - 7.0 x10E3/uL   Lymphocytes Absolute 1.7 0.7 - 3.1 x10E3/uL   Monocytes Absolute 0.4 0.1 - 0.9 x10E3/uL   EOS (ABSOLUTE) 0.1 0.0 - 0.4 x10E3/uL   Basophils Absolute 0.1 0.0 - 0.2 x10E3/uL   Immature Granulocytes 0 Not Estab. %   Immature Grans (Abs) 0.0 0.0 - 0.1 x10E3/uL      Assessment & Plan:   Problem List Items Addressed This Visit      Cardiovascular and Mediastinum   Hypertension - Primary    The current medical regimen is effective;  continue present plan  and medications.       Relevant Orders   Basic metabolic panel   LP+ALT+AST Piccolo, Waived     Respiratory   Sleep apnea    The current medical regimen is effective;  continue present plan and medications.          Other   High cholesterol    The current medical regimen is effective;  continue present plan and medications.       Relevant Orders   Basic metabolic panel   LP+ALT+AST Piccolo, Waived   Gout    The current medical regimen is effective;  continue present plan and medications.           Follow up plan: Return in about 6 months (around 02/04/2018) for Physical Exam.

## 2017-08-06 ENCOUNTER — Encounter: Payer: Self-pay | Admitting: Family Medicine

## 2017-08-06 LAB — BASIC METABOLIC PANEL
BUN/Creatinine Ratio: 20 (ref 10–24)
BUN: 16 mg/dL (ref 8–27)
CALCIUM: 9.1 mg/dL (ref 8.6–10.2)
CHLORIDE: 103 mmol/L (ref 96–106)
CO2: 23 mmol/L (ref 20–29)
Creatinine, Ser: 0.82 mg/dL (ref 0.76–1.27)
GFR calc Af Amer: 111 mL/min/{1.73_m2} (ref >59)
GFR calc non Af Amer: 96 mL/min/{1.73_m2} (ref >59)
GLUCOSE: 113 mg/dL — AB (ref 65–99)
Potassium: 4.1 mmol/L (ref 3.5–5.2)
Sodium: 141 mmol/L (ref 134–144)

## 2017-08-12 DIAGNOSIS — M5416 Radiculopathy, lumbar region: Secondary | ICD-10-CM | POA: Diagnosis not present

## 2017-08-12 DIAGNOSIS — M5136 Other intervertebral disc degeneration, lumbar region: Secondary | ICD-10-CM | POA: Diagnosis not present

## 2017-08-21 ENCOUNTER — Ambulatory Visit: Payer: Self-pay | Admitting: *Deleted

## 2017-08-21 NOTE — Telephone Encounter (Signed)
pt states his left ear has been clogged up and feels "like there is pressure built up"; yesterday he started experiencing ear pain; recommendations made per nurse triage protocol to include seeing a physician within 24 hours; the pt normally sees Dr Jeananne Rama but he, nor any providers in the office have availability within the parameters per protocol; pt also advised that urgent care is an option but he would like to know if something can be called in for him; his best contact number is [phone]; will route to office for notification; also spoke with Christan.    Reason for Disposition . Earache  (Exceptions: brief ear pain of < 60 minutes duration, earache occurring during air travel  Answer Assessment - Initial Assessment Questions 1. LOCATION: "Which ear is involved?"     Left ear 2. ONSET: "When did the ear start hurting"      08/14/17 3. SEVERITY: "How bad is the pain?"  (Scale 1-10; mild, moderate or severe)   - MILD (1-3): doesn't interfere with normal activities    - MODERATE (4-7): interferes with normal activities or awakens from sleep    - SEVERE (8-10): excruciating pain, unable to do any normal activities   mild to moderate 4. URI SYMPTOMS: " Do you have a runny nose or cough?"     Seasonal allergies, runny nose, cough, yellowish-green sputum 5. FEVER: "Do you have a fever?" If so, ask: "What is your temperature, how was it measured, and when did it start?"     no 6. CAUSE: "Have you been swimming recently?", "How often do you use Q-TIPS?", "Have you had any recent air travel or scuba diving?"     Use q-tips to swab early daily 7. OTHER SYMPTOMS: "Do you have any other symptoms?" (e.g., headache, stiff neck, dizziness, vomiting, runny nose, decreased hearing)    Decreased hearing 8. PREGNANCY: "Is there any chance you are pregnant?" "When was your last menstrual period?"    n/a  Protocols used: EARACHE-A-AH

## 2017-08-21 NOTE — Telephone Encounter (Signed)
Attempted to contact pt to make him aware that I had spoken with Christan at [facility], and Dr Park Liter can see him 08/22/17 at 1015; left message on voice mail [phone]; also made Christan aware and she will also attempt to contact pt.

## 2017-08-22 ENCOUNTER — Ambulatory Visit (INDEPENDENT_AMBULATORY_CARE_PROVIDER_SITE_OTHER): Payer: 59 | Admitting: Family Medicine

## 2017-08-22 ENCOUNTER — Encounter: Payer: Self-pay | Admitting: Family Medicine

## 2017-08-22 VITALS — BP 125/68 | HR 44 | Wt 223.4 lb

## 2017-08-22 DIAGNOSIS — H66002 Acute suppurative otitis media without spontaneous rupture of ear drum, left ear: Secondary | ICD-10-CM | POA: Diagnosis not present

## 2017-08-22 MED ORDER — AMOXICILLIN-POT CLAVULANATE 875-125 MG PO TABS: 1.0000 | ORAL_TABLET | Freq: Two times a day (BID) | ORAL | 0 refills | Status: DC

## 2017-08-22 NOTE — Progress Notes (Signed)
BP 125/68   Pulse (!) 44   Wt 223 lb 6.4 oz (101.3 kg)   SpO2 97%   BMI 33.97 kg/m    Subjective:    Patient ID: Daniel Cook, male    DOB: 05/23/1955, 62 y.o.   MRN: [id number]  HPI: Daniel Cook is a 62 y.o. male  Chief Complaint  Patient presents with  . Ear Pain    Ear fullness x 1.5 weeks. "Like in an airplane", pain started day before yesterday   EAG CLOGGED Duration: 1-2 weeks clogged, pain 2 days ago Involved ear(s):  left Sensation of feeling clogged/plugged: yes Decreased/muffled hearing:yes Ear pain: yes Fever: yes Otorrhea: no Hearing loss: yes Upper respiratory infection symptoms: yes Using Q-Tips: yes Status: worse History of cerumenosis: no Treatments attempted: none  Relevant past medical, surgical, family and social history reviewed and updated as indicated. Interim medical history since our last visit reviewed. Allergies and medications reviewed and updated.  Review of Systems  Constitutional: Positive for fever. Negative for activity change, appetite change, chills, diaphoresis, fatigue and unexpected weight change.  HENT: Positive for congestion, ear pain, postnasal drip and rhinorrhea. Negative for dental problem, drooling, ear discharge, facial swelling, hearing loss, mouth sores, nosebleeds, sinus pressure, sinus pain, sneezing, sore throat, tinnitus, trouble swallowing and voice change.   Eyes: Negative.   Respiratory: Negative.   Cardiovascular: Negative.   Psychiatric/Behavioral: Negative.     Per HPI unless specifically indicated above     Objective:    BP 125/68   Pulse (!) 44   Wt 223 lb 6.4 oz (101.3 kg)   SpO2 97%   BMI 33.97 kg/m   Wt Readings from Last 3 Encounters:  08/22/17 223 lb 6.4 oz (101.3 kg)  08/05/17 222 lb (100.7 kg)  02/04/17 218 lb (98.9 kg)    Physical Exam  Constitutional: He is oriented to person, place, and time. He appears well-developed and well-nourished. No distress.  HENT:  Head:  Normocephalic and atraumatic.  Right Ear: Hearing, tympanic membrane, external ear and ear canal normal.  Left Ear: Hearing, external ear and ear canal normal. Tympanic membrane is injected and erythematous.  Nose: Nose normal.  Mouth/Throat: Uvula is midline, oropharynx is clear and moist and mucous membranes are normal. No oropharyngeal exudate.  Eyes: Pupils are equal, round, and reactive to light. Conjunctivae, EOM and lids are normal. Right eye exhibits no discharge. Left eye exhibits no discharge. No scleral icterus.  Neck: Normal range of motion. Neck supple. No JVD present. No tracheal deviation present. No thyromegaly present.  Cardiovascular: Normal rate, regular rhythm, normal heart sounds and intact distal pulses. Exam reveals no gallop and no friction rub.  No murmur heard. Pulmonary/Chest: Effort normal and breath sounds normal. No stridor. No respiratory distress. He has no wheezes. He has no rales. He exhibits no tenderness.  Musculoskeletal: Normal range of motion.  Lymphadenopathy:    He has no cervical adenopathy.  Neurological: He is alert and oriented to person, place, and time.  Skin: Skin is warm, dry and intact. Capillary refill takes less than 2 seconds. No rash noted. He is not diaphoretic. No erythema. No pallor.  Psychiatric: He has a normal mood and affect. His speech is normal and behavior is normal. Judgment and thought content normal. Cognition and memory are normal.  Nursing note and vitals reviewed.   Results for orders placed or performed in visit on 00/86/76  Basic metabolic panel  Result Value Ref Range   Glucose  113 (H) 65 - 99 mg/dL   BUN 16 8 - 27 mg/dL   Creatinine, Ser 0.82 0.76 - 1.27 mg/dL   GFR calc non Af Amer 96 >59 mL/min/1.73   GFR calc Af Amer 111 >59 mL/min/1.73   BUN/Creatinine Ratio 20 10 - 24   Sodium 141 134 - 144 mmol/L   Potassium 4.1 3.5 - 5.2 mmol/L   Chloride 103 96 - 106 mmol/L   CO2 23 20 - 29 mmol/L   Calcium 9.1 8.6 -  10.2 mg/dL  LP+ALT+AST Piccolo, Waived  Result Value Ref Range   ALT (SGPT) Piccolo, Waived 31 10 - 47 U/L   AST (SGOT) Piccolo, Waived 30 11 - 38 U/L   Cholesterol Piccolo, Waived 184 <200 mg/dL   HDL Chol Piccolo, Waived 49 (L) >59 mg/dL   Triglycerides Piccolo,Waived 247 (H) <150 mg/dL   Chol/HDL Ratio Piccolo,Waive 3.7 mg/dL   LDL Chol Calc Piccolo Waived 85 <100 mg/dL   VLDL Chol Calc Piccolo,Waive 49 (H) <30 mg/dL      Assessment & Plan:   Problem List Items Addressed This Visit    None    Visit Diagnoses    Non-recurrent acute suppurative otitis media of left ear without spontaneous rupture of tympanic membrane    -  Primary   Will treat with augmentin. Call with any concerns or if not getting better.    Relevant Medications   amoxicillin-clavulanate (AUGMENTIN) 875-125 MG tablet       Follow up plan: Return if symptoms worsen or fail to improve.

## 2017-09-16 DIAGNOSIS — R001 Bradycardia, unspecified: Secondary | ICD-10-CM | POA: Insufficient documentation

## 2017-09-16 DIAGNOSIS — I10 Essential (primary) hypertension: Secondary | ICD-10-CM | POA: Diagnosis not present

## 2017-09-16 DIAGNOSIS — E782 Mixed hyperlipidemia: Secondary | ICD-10-CM | POA: Diagnosis not present

## 2017-09-16 DIAGNOSIS — G4733 Obstructive sleep apnea (adult) (pediatric): Secondary | ICD-10-CM | POA: Diagnosis not present

## 2017-10-02 ENCOUNTER — Encounter: Payer: Self-pay | Admitting: Family Medicine

## 2017-10-15 ENCOUNTER — Ambulatory Visit: Payer: Self-pay | Admitting: *Deleted

## 2017-10-15 NOTE — Telephone Encounter (Signed)
Pt reports "Very mild, nagging" chest pain at lower left breast area, at ribs. Does not radiate; denies nausea, diaphoresis. Pain is intermittent, onset Monday. Lasts few seconds to 1 minute. Denies SOB. Reports more fatigued, "No energy." States had similar episode years ago, "EKG and everything normal."  Reports had palpitations Monday, 6 throughout the day, has not occurred since. Pt is unable to make it to an appt until Friday. Appt secured with R. [address] Friday am. Care advise given. Instructed pt to go to ED if pain worsens, radiates, nausea or diaphoresis occur, SOB or dizziness, if palpitations reoccur in duration and/or frequency. Pt verbalized understanding. Pt aware NT recommends earlier appt; states cannot due to work issues. Reiterated need to go to ED as noted above.  Reason for Disposition . [1] Chest pain(s) lasting a few seconds AND [2] persists > 3 days  Answer Assessment - Initial Assessment Questions 1. LOCATION: "Where does it hurt?"       lower left breast area, at ribs 2. RADIATION: "Does the pain go anywhere else?" (e.g., into neck, jaw, arms, back)     no 3. ONSET: "When did the chest pain begin?" (Minutes, hours or days)      Monday 4. PATTERN "Does the pain come and go, or has it been constant since it started?"  "Does it get worse with exertion?"      Intermittent 5. DURATION: "How long does it last" (e.g., seconds, minutes, hours)     Few seconds to 1 minute 6. SEVERITY: "How bad is the pain?"  (e.g., Scale 1-10; mild, moderate, or severe)    - MILD (1-3): doesn't interfere with normal activities     - MODERATE (4-7): interferes with normal activities or awakens from sleep    - SEVERE (8-10): excruciating pain, unable to do any normal activities       "Very mild" NAgging 7. CARDIAC RISK FACTORS: "Do you have any history of heart problems or risk factors for heart disease?" (e.g., prior heart attack, angina; high blood pressure, diabetes, being overweight, high  cholesterol, smoking, or strong family history of heart disease)     HTN 8. PULMONARY RISK FACTORS: "Do you have any history of lung disease?"  (e.g., blood clots in lung, asthma, emphysema, birth control pills)     no 9. CAUSE: "What do you think is causing the chest pain?"     unsure 10. OTHER SYMPTOMS: "Do you have any other symptoms?" (e.g., dizziness, nausea, vomiting, sweating, fever, difficulty breathing, cough)       Fatigued, palpitations Monday only. 5-6 throughout day, brief  Protocols used: CHEST PAIN-A-AH

## 2017-10-17 ENCOUNTER — Other Ambulatory Visit: Payer: Self-pay

## 2017-10-17 ENCOUNTER — Encounter: Payer: Self-pay | Admitting: Family Medicine

## 2017-10-17 ENCOUNTER — Ambulatory Visit: Payer: 59 | Admitting: Family Medicine

## 2017-10-17 VITALS — BP 126/63 | HR 42 | Temp 97.1°F | Ht 68.0 in | Wt 218.3 lb

## 2017-10-17 DIAGNOSIS — R001 Bradycardia, unspecified: Secondary | ICD-10-CM

## 2017-10-17 DIAGNOSIS — I10 Essential (primary) hypertension: Secondary | ICD-10-CM

## 2017-10-17 DIAGNOSIS — R079 Chest pain, unspecified: Secondary | ICD-10-CM

## 2017-10-17 DIAGNOSIS — R42 Dizziness and giddiness: Secondary | ICD-10-CM | POA: Diagnosis not present

## 2017-10-17 NOTE — Progress Notes (Signed)
BP 126/63   Pulse (!) 42   Temp (!) 97.1 F (36.2 C) (Tympanic)   Ht 5\' 8"  (1.727 m)   Wt 218 lb 4.8 oz (99 kg)   SpO2 97%   BMI 33.19 kg/m    Subjective:    Patient ID: Daniel Cook, male    DOB: 1955-06-05, 62 y.o.   MRN: [id number]  HPI: Daniel Cook is a 62 y.o. male  Chief Complaint  Patient presents with  . Chest Pain    pt states he is having pressure, dizziness, fatigue x 2 years but got worsen since this Monday   Pt here today for left lower chest pressure that he's noticed intermittently x 2 years but has been worse the past month. Denies sharp pains, diaphoresis, nausea, or SOB associated. Doesn't seem to change between exertion or rest. Hx of HTN, hyperlipidemia, and bradycardia with typically HRs between 48-70s. No other known cardiac hx. Followed by Cardiology 1-2 times annually, last seen 1 month ago. Currently today having dizziness and fatigue with HR of 42 on intake. Has not yet eaten today but otherwise normal day for him.   Past Medical History:  Diagnosis Date  . Gout   . High cholesterol   . Hypertension   . Obesity   . Sleep apnea    Social History   Socioeconomic History  . Marital status: Divorced    Spouse name: Not on file  . Number of children: Not on file  . Years of education: Not on file  . Highest education level: Not on file  Occupational History  . Not on file  Social Needs  . Financial resource strain: Not on file  . Food insecurity:    Worry: Not on file    Inability: Not on file  . Transportation needs:    Medical: Not on file    Non-medical: Not on file  Tobacco Use  . Smoking status: Former Smoker    Types: Cigarettes    Last attempt to quit: 10/04/1998    Years since quitting: 19.0  . Smokeless tobacco: Former Systems developer    Types: [place] date: 03/22/2014  . Tobacco comment: dip occassionally  Substance and Sexual Activity  . Alcohol use: Yes    Comment: occ  . Drug use: No    Types: Marijuana  .  Sexual activity: Not on file  Lifestyle  . Physical activity:    Days per week: Not on file    Minutes per session: Not on file  . Stress: Not on file  Relationships  . Social connections:    Talks on phone: Not on file    Gets together: Not on file    Attends religious service: Not on file    Active member of club or organization: Not on file    Attends meetings of clubs or organizations: Not on file    Relationship status: Not on file  . Intimate partner violence:    Fear of current or ex partner: Not on file    Emotionally abused: Not on file    Physically abused: Not on file    Forced sexual activity: Not on file  Other Topics Concern  . Not on file  Social History Narrative  . Not on file   Relevant past medical, surgical, family and social history reviewed and updated as indicated. Interim medical history since our last visit reviewed. Allergies and medications reviewed and updated.  Review of Systems  Per HPI unless specifically indicated above     Objective:    BP 126/63   Pulse (!) 42   Temp (!) 97.1 F (36.2 C) (Tympanic)   Ht 5\' 8"  (1.727 m)   Wt 218 lb 4.8 oz (99 kg)   SpO2 97%   BMI 33.19 kg/m   Wt Readings from Last 3 Encounters:  10/17/17 218 lb 4.8 oz (99 kg)  08/22/17 223 lb 6.4 oz (101.3 kg)  08/05/17 222 lb (100.7 kg)    Physical Exam  Constitutional: He is oriented to person, place, and time. He appears well-developed and well-nourished.  HENT:  Head: Atraumatic.  Eyes: Conjunctivae are normal.  Neck: Normal range of motion. Neck supple.  Cardiovascular: Regular rhythm.  Markedly bradycardic  Pulmonary/Chest: Effort normal and breath sounds normal.  Musculoskeletal: Normal range of motion.  Neurological: He is alert and oriented to person, place, and time.  Skin: Skin is warm and dry.  Psychiatric: He has a normal mood and affect. His behavior is normal.  Nursing note and vitals reviewed.   Results for orders placed or performed in  visit on 94/17/40  Basic metabolic panel  Result Value Ref Range   Glucose 113 (H) 65 - 99 mg/dL   BUN 16 8 - 27 mg/dL   Creatinine, Ser 0.82 0.76 - 1.27 mg/dL   GFR calc non Af Amer 96 >59 mL/min/1.73   GFR calc Af Amer 111 >59 mL/min/1.73   BUN/Creatinine Ratio 20 10 - 24   Sodium 141 134 - 144 mmol/L   Potassium 4.1 3.5 - 5.2 mmol/L   Chloride 103 96 - 106 mmol/L   CO2 23 20 - 29 mmol/L   Calcium 9.1 8.6 - 10.2 mg/dL  LP+ALT+AST Piccolo, Waived  Result Value Ref Range   ALT (SGPT) Piccolo, Waived 31 10 - 47 U/L   AST (SGOT) Piccolo, Waived 30 11 - 38 U/L   Cholesterol Piccolo, Waived 184 <200 mg/dL   HDL Chol Piccolo, Waived 49 (L) >59 mg/dL   Triglycerides Piccolo,Waived 247 (H) <150 mg/dL   Chol/HDL Ratio Piccolo,Waive 3.7 mg/dL   LDL Chol Calc Piccolo Waived 85 <100 mg/dL   VLDL Chol Calc Piccolo,Waive 49 (H) <30 mg/dL      Assessment & Plan:   Problem List Items Addressed This Visit      Cardiovascular and Mediastinum   Hypertension    BPs stable, HR significantly low. Will await Cardiology appt this afternoon to assess his bradycardia        Other   Bradycardia    Symptomatic bradycardia today which continued after food and drink and ambulation. Orthostatics without significant changes. CMA spoke with Cardiology office, appt scheduled for this afternoon for further management. Pt to go home and rest until appt, work note to be sent in. Return precautions given to go to ER if sxs worsening       Other Visit Diagnoses    Chest pain, unspecified type    -  Primary   EKG benign today other than marked sinus bradycardia at 42 bpm. No suspicious T wave or ST changes noted. Will defer to Cardiology for further eval of his CP   Relevant Orders   EKG 12-Lead (Completed)       Follow up plan: Return for Cardiology appt this afternoon.

## 2017-10-17 NOTE — Patient Instructions (Signed)
You have an appointment scheduled today at  [facility] Cardiology  10/17/17 at 2:15 with Dr. Nehemiah Massed

## 2017-10-19 NOTE — Assessment & Plan Note (Signed)
Symptomatic bradycardia today which continued after food and drink and ambulation. Orthostatics without significant changes. CMA spoke with Cardiology office, appt scheduled for this afternoon for further management. Pt to go home and rest until appt, work note to be sent in. Return precautions given to go to ER if sxs worsening

## 2017-10-19 NOTE — Assessment & Plan Note (Signed)
BPs stable, HR significantly low. Will await Cardiology appt this afternoon to assess his bradycardia

## 2017-10-22 DIAGNOSIS — I63342 Cerebral infarction due to thrombosis of left cerebellar artery: Secondary | ICD-10-CM | POA: Diagnosis not present

## 2017-10-28 DIAGNOSIS — R001 Bradycardia, unspecified: Secondary | ICD-10-CM | POA: Diagnosis not present

## 2017-10-28 DIAGNOSIS — G4733 Obstructive sleep apnea (adult) (pediatric): Secondary | ICD-10-CM | POA: Diagnosis not present

## 2017-10-28 DIAGNOSIS — I10 Essential (primary) hypertension: Secondary | ICD-10-CM | POA: Diagnosis not present

## 2017-12-23 DIAGNOSIS — M5416 Radiculopathy, lumbar region: Secondary | ICD-10-CM | POA: Diagnosis not present

## 2017-12-23 DIAGNOSIS — M5136 Other intervertebral disc degeneration, lumbar region: Secondary | ICD-10-CM | POA: Diagnosis not present

## 2018-01-23 ENCOUNTER — Other Ambulatory Visit: Payer: Self-pay | Admitting: Family Medicine

## 2018-01-23 DIAGNOSIS — E7800 Pure hypercholesterolemia, unspecified: Secondary | ICD-10-CM

## 2018-01-23 DIAGNOSIS — I10 Essential (primary) hypertension: Secondary | ICD-10-CM

## 2018-01-23 DIAGNOSIS — M1000 Idiopathic gout, unspecified site: Secondary | ICD-10-CM

## 2018-01-26 NOTE — Telephone Encounter (Signed)
Requested Prescriptions  Pending Prescriptions Disp Refills  . atorvastatin (LIPITOR) 40 MG tablet [Pharmacy Med Name: ATORVASTATIN  40MG   TAB] 90 tablet 0    Sig: TAKE 1 TABLET BY MOUTH  DAILY     Cardiovascular:  Antilipid - Statins Failed - 01/23/2018  9:16 PM      Failed - HDL in normal range and within 360 days    HDL  Date Value Ref Range Status  02/04/2017 52 >39 mg/dL Final         Failed - Triglycerides in normal range and within 360 days    Triglycerides Piccolo,Waived  Date Value Ref Range Status  08/05/2017 247 (H) <150 mg/dL Final    Comment:                            Normal                   <150                         Borderline High     150 - 199                         High                200 - 499                         Very High                >499          Passed - Total Cholesterol in normal range and within 360 days    Cholesterol Piccolo, Waived  Date Value Ref Range Status  08/05/2017 184 <200 mg/dL Final    Comment:                            Desirable                <200                         Borderline High      200- 239                         High                     >239          Passed - LDL in normal range and within 360 days    LDL Calculated  Date Value Ref Range Status  02/04/2017 106 (H) 0 - 99 mg/dL Final         Passed - Patient is not pregnant      Passed - Valid encounter within last 12 months    Recent Outpatient Visits          3 months ago Chest pain, unspecified type   [facility], [place], PA-C   5 months ago Non-recurrent acute suppurative otitis media of left ear without spontaneous rupture of tympanic membrane   [facility] [place], Megan P, DO   5 months ago Essential hypertension   [place], Jeannette How, MD   11 months ago Needs flu  shot   [facility] [place], Jeannette How, MD   1 year ago Essential hypertension   [place], Jeannette How, MD      Future Appointments            In 1 month [place], Jeannette How, MD [facility]         . allopurinol (ZYLOPRIM) 300 MG tablet [Pharmacy Med Name: ALLOPURINOL  300MG   TAB] 90 tablet 4    Sig: TAKE 1 TABLET BY MOUTH  DAILY     Endocrinology:  Gout Agents Passed - 01/23/2018  9:16 PM      Passed - Uric Acid in normal range and within 360 days    Uric Acid  Date Value Ref Range Status  02/04/2017 5.2 3.7 - 8.6 mg/dL Final    Comment:               Therapeutic target for gout patients: <6.0         Passed - Cr in normal range and within 360 days    Creatinine, Ser  Date Value Ref Range Status  08/05/2017 0.82 0.76 - 1.27 mg/dL Final         Passed - Valid encounter within last 12 months    Recent Outpatient Visits          3 months ago Chest pain, unspecified type   [facility] [place], PA-C   5 months ago Non-recurrent acute suppurative otitis media of left ear without spontaneous rupture of tympanic membrane   [facility] [place], Megan P, DO   5 months ago Essential hypertension   [facility] [place], Jeannette How, MD   11 months ago Needs flu shot   [employer], Jeannette How, MD   1 year ago Essential hypertension   [place], Jeannette How, MD      Future Appointments            In 1 month Criss[place], Jeannette How, MD [place], [facility]         . losartan (COZAAR) 100 MG tablet [Pharmacy Med Name: LOSARTAN  100MG   TAB] 90 tablet 0    Sig: TAKE 1 TABLET BY MOUTH  DAILY     Cardiovascular:  Angiotensin Receptor Blockers Passed - 01/23/2018  9:16 PM      Passed - Cr in normal range and within 180 days    Creatinine, Ser  Date Value Ref Range Status  08/05/2017 0.82 0.76 - 1.27 mg/dL Final         Passed - K in normal range and within 180 days    Potassium  Date Value Ref Range Status  08/05/2017 4.1 3.5 - 5.2 mmol/L Final          Passed - Patient is not pregnant      Passed - Last BP in normal range    BP Readings from Last 1 Encounters:  10/17/17 126/63         Passed - Valid encounter within last 6 months    Recent Outpatient Visits          3 months ago Chest pain, unspecified type   [facility] Vol[facility] American, PA-C   5 months ago Non-recurrent acute suppurative otitis media of left ear without spontaneous rupture of tympanic membrane   [facility] [place], Megan P, DO   5 months ago Essential hypertension   [facility], Jeannette How, MD   11 months ago Needs flu shot   [facility] [place], Jeannette How, MD   1 year ago Essential hypertension   [place], Jeannette How, MD      Future Appointments            In 1 month Crissman, Jeannette How, MD [place], [facility]         . amLODipine ([place]) 10 MG tablet [Pharmacy Med Name: AMLODIPINE  10MG   TAB] 90 tablet 0    Sig: TAKE 1 TABLET BY MOUTH  DAILY     Cardiovascular:  Calcium Channel Blockers Passed - 01/23/2018  9:16 PM      Passed - Last BP in normal range    BP Readings from Last 1 Encounters:  10/17/17 126/63         Passed - Valid encounter within last 6 months    Recent Outpatient Visits          3 months ago Chest pain, unspecified type   [facility] Volney American, PA-C   5 months ago Non-recurrent acute suppurative otitis media of left ear without spontaneous rupture of tympanic membrane   [facility] [place], [place], DO   5 months ago Essential hypertension   [facility] [place], Jeannette How, MD   11 months ago Needs flu shot   [facility] [place], Jeannette How, MD   1 year ago Essential hypertension   [place], Jeannette How, MD      Future Appointments            In 1 month Criss[place], Jeannette How, MD [facility], [facility]

## 2018-01-26 NOTE — Telephone Encounter (Signed)
Requested Prescriptions  Pending Prescriptions Disp Refills  . atorvastatin (LIPITOR) 40 MG tablet [Pharmacy Med Name: ATORVASTATIN  40MG   TAB] 90 tablet 4    Sig: TAKE 1 TABLET BY MOUTH  DAILY     Cardiovascular:  Antilipid - Statins Failed - 01/23/2018  9:16 PM      Failed - HDL in normal range and within 360 days    HDL  Date Value Ref Range Status  02/04/2017 52 >39 mg/dL Final         Failed - Triglycerides in normal range and within 360 days    Triglycerides Piccolo,Waived  Date Value Ref Range Status  08/05/2017 247 (H) <150 mg/dL Final    Comment:                            Normal                   <150                         Borderline High     150 - 199                         High                200 - 499                         Very High                >499          Passed - Total Cholesterol in normal range and within 360 days    Cholesterol Piccolo, Waived  Date Value Ref Range Status  08/05/2017 184 <200 mg/dL Final    Comment:                            Desirable                <200                         Borderline High      200- 239                         High                     >239          Passed - LDL in normal range and within 360 days    LDL Calculated  Date Value Ref Range Status  02/04/2017 106 (H) 0 - 99 mg/dL Final         Passed - Patient is not pregnant      Passed - Valid encounter within last 12 months    Recent Outpatient Visits          3 months ago Chest pain, unspecified type   [facility], [place], [place]   5 months ago Non-recurrent acute suppurative otitis media of left ear without spontaneous rupture of tympanic membrane   [facility] [place], Megan P, DO   5 months ago Essential hypertension   [place], Jeannette How, MD   11 months ago Needs flu  shot   [facility] [place], Jeannette How, MD   1 year ago Essential hypertension   [place], Jeannette How, MD      Future Appointments            In 1 month Criss[place], Jeannette How, MD [facility], [facility]         . allopurinol (ZYLOPRIM) 300 MG tablet [Pharmacy Med Name: ALLOPURINOL  300MG   TAB] 90 tablet 4    Sig: TAKE 1 TABLET BY MOUTH  DAILY     Endocrinology:  Gout Agents Passed - 01/23/2018  9:16 PM      Passed - Uric Acid in normal range and within 360 days    Uric Acid  Date Value Ref Range Status  02/04/2017 5.2 3.7 - 8.6 mg/dL Final    Comment:               Therapeutic target for gout patients: <6.0         Passed - Cr in normal range and within 360 days    Creatinine, Ser  Date Value Ref Range Status  08/05/2017 0.82 0.76 - 1.27 mg/dL Final         Passed - Valid encounter within last 12 months    Recent Outpatient Visits          3 months ago Chest pain, unspecified type   [facility] Vol[place] American, PA-C   5 months ago Non-recurrent acute suppurative otitis media of left ear without spontaneous rupture of tympanic membrane   [facility] [place], Megan P, DO   5 months ago Essential hypertension   [facility] [place], Jeannette How, MD   11 months ago Needs flu shot   [employer], Jeannette How, MD   1 year ago Essential hypertension   [place], Jeannette How, MD      Future Appointments            In 1 month Cr[place], Jeannette How, MD [place], [facility]         . losartan (COZAAR) 100 MG tablet [Pharmacy Med Name: LOSARTAN  100MG   TAB] 90 tablet 4    Sig: TAKE 1 TABLET BY MOUTH  DAILY     Cardiovascular:  Angiotensin Receptor Blockers Passed - 01/23/2018  9:16 PM      Passed - Cr in normal range and within 180 days    Creatinine, Ser  Date Value Ref Range Status  08/05/2017 0.82 0.76 - 1.27 mg/dL Final         Passed - K in normal range and within 180 days    Potassium  Date Value Ref Range Status  08/05/2017 4.1 3.5 - 5.2 mmol/L Final          Passed - Patient is not pregnant      Passed - Last BP in normal range    BP Readings from Last 1 Encounters:  10/17/17 126/63         Passed - Valid encounter within last 6 months    Recent Outpatient Visits          3 months ago Chest pain, unspecified type   [facility] Volney American, PA-C   5 months ago Non-recurrent acute suppurative otitis media of left ear without spontaneous rupture of tympanic membrane   [facility] [place], Megan P, DO   5 months ago Essential hypertension   [facility], Jeannette How, MD   11 months ago Needs flu shot   [facility] [place], Jeannette How, MD   1 year ago Essential hypertension   [place], Jeannette How, MD      Future Appointments            In 1 month [place], Jeannette How, MD [place], [facility]         . amLODipine ([place]) 10 MG tablet [Pharmacy Med Name: AMLODIPINE  10MG   TAB] 90 tablet 4    Sig: TAKE 1 TABLET BY MOUTH  DAILY     Cardiovascular:  Calcium Channel Blockers Passed - 01/23/2018  9:16 PM      Passed - Last BP in normal range    BP Readings from Last 1 Encounters:  10/17/17 126/63         Passed - Valid encounter within last 6 months    Recent Outpatient Visits          3 months ago Chest pain, unspecified type   [facility] Volney American, PA-C   5 months ago Non-recurrent acute suppurative otitis media of left ear without spontaneous rupture of tympanic membrane   [facility] [place], [place], DO   5 months ago Essential hypertension   [facility] [place], Jeannette How, MD   11 months ago Needs flu shot   [facility] [place], Jeannette How, MD   1 year ago Essential hypertension   [place], Jeannette How, MD      Future Appointments            In 1 month [place], Jeannette How, MD [facility], [facility]

## 2018-02-10 ENCOUNTER — Encounter: Payer: 59 | Admitting: Family Medicine

## 2018-02-11 ENCOUNTER — Encounter: Payer: 59 | Admitting: Family Medicine

## 2018-02-27 DIAGNOSIS — J209 Acute bronchitis, unspecified: Secondary | ICD-10-CM | POA: Diagnosis not present

## 2018-02-27 DIAGNOSIS — R0602 Shortness of breath: Secondary | ICD-10-CM | POA: Diagnosis not present

## 2018-03-03 ENCOUNTER — Encounter: Payer: Self-pay | Admitting: Family Medicine

## 2018-03-03 ENCOUNTER — Ambulatory Visit (INDEPENDENT_AMBULATORY_CARE_PROVIDER_SITE_OTHER): Payer: 59 | Admitting: Family Medicine

## 2018-03-03 VITALS — BP 175/84 | HR 43 | Temp 98.4°F | Ht 67.2 in | Wt 221.6 lb

## 2018-03-03 DIAGNOSIS — E7800 Pure hypercholesterolemia, unspecified: Secondary | ICD-10-CM | POA: Diagnosis not present

## 2018-03-03 DIAGNOSIS — I10 Essential (primary) hypertension: Secondary | ICD-10-CM | POA: Diagnosis not present

## 2018-03-03 DIAGNOSIS — M1000 Idiopathic gout, unspecified site: Secondary | ICD-10-CM | POA: Diagnosis not present

## 2018-03-03 DIAGNOSIS — Z0000 Encounter for general adult medical examination without abnormal findings: Secondary | ICD-10-CM | POA: Diagnosis not present

## 2018-03-03 DIAGNOSIS — M51369 Other intervertebral disc degeneration, lumbar region without mention of lumbar back pain or lower extremity pain: Secondary | ICD-10-CM

## 2018-03-03 DIAGNOSIS — M5136 Other intervertebral disc degeneration, lumbar region: Secondary | ICD-10-CM | POA: Diagnosis not present

## 2018-03-03 DIAGNOSIS — R001 Bradycardia, unspecified: Secondary | ICD-10-CM

## 2018-03-03 DIAGNOSIS — M47812 Spondylosis without myelopathy or radiculopathy, cervical region: Secondary | ICD-10-CM

## 2018-03-03 DIAGNOSIS — G4730 Sleep apnea, unspecified: Secondary | ICD-10-CM

## 2018-03-03 LAB — URINALYSIS, ROUTINE W REFLEX MICROSCOPIC
Bilirubin, UA: NEGATIVE
GLUCOSE, UA: NEGATIVE
KETONES UA: NEGATIVE
LEUKOCYTES UA: NEGATIVE
Nitrite, UA: NEGATIVE
SPEC GRAV UA: 1.02 (ref 1.005–1.030)
Urobilinogen, Ur: 0.2 mg/dL (ref 0.2–1.0)
pH, UA: 6 (ref 5.0–7.5)

## 2018-03-03 LAB — MICROSCOPIC EXAMINATION: Bacteria, UA: NONE SEEN

## 2018-03-03 MED ORDER — GABAPENTIN 300 MG PO CAPS: ORAL_CAPSULE | ORAL | 4 refills | Status: DC

## 2018-03-03 MED ORDER — ALLOPURINOL 300 MG PO TABS: 300.0000 mg | ORAL_TABLET | Freq: Every day | ORAL | 4 refills | Status: DC

## 2018-03-03 MED ORDER — OMEPRAZOLE 20 MG PO CPDR: 20.0000 mg | DELAYED_RELEASE_CAPSULE | Freq: Every day | ORAL | 4 refills | Status: DC

## 2018-03-03 MED ORDER — ATORVASTATIN CALCIUM 40 MG PO TABS: 40.0000 mg | ORAL_TABLET | Freq: Every day | ORAL | 4 refills | Status: DC

## 2018-03-03 MED ORDER — HYDROCHLOROTHIAZIDE 12.5 MG PO TABS: 12.5000 mg | ORAL_TABLET | Freq: Every day | ORAL | 4 refills | Status: DC

## 2018-03-03 MED ORDER — NAPROXEN 500 MG PO TABS: 500.0000 mg | ORAL_TABLET | Freq: Two times a day (BID) | ORAL | 4 refills | Status: DC

## 2018-03-03 MED ORDER — LOSARTAN POTASSIUM 100 MG PO TABS: 100.0000 mg | ORAL_TABLET | Freq: Every day | ORAL | 4 refills | Status: DC

## 2018-03-03 MED ORDER — EZETIMIBE 10 MG PO TABS: 10.0000 mg | ORAL_TABLET | Freq: Every day | ORAL | 4 refills | Status: DC

## 2018-03-03 NOTE — Assessment & Plan Note (Signed)
The current medical regimen is effective;  continue present plan and medications.  

## 2018-03-03 NOTE — Progress Notes (Signed)
BP (!) 175/84 (BP Location: Left Arm, Cuff Size: Normal)   Pulse (!) 43   Temp 98.4 F (36.9 C) (Oral)   Ht 5' 7.2" (1.707 m)   Wt 221 lb 9.6 oz (100.5 kg)   SpO2 97%   BMI 34.50 kg/m    Subjective:    Patient ID: Daniel Cook, male    DOB: [birth year], 62 y.o.   MRN: [id number]  HPI: Daniel Cook is a 62 y.o. male  Chief Complaint  Patient presents with  . Annual Exam  Patient with mole on his left cheeks concerned about on review it is a seborrheic keratosis will observe. Patient also with high blood pressure noted along with bradycardia reviewed cardiology notes from this summer for bradycardia was worked up and felt to be a normal for patient.  Negative cardiac work-up.  Patient was noted to be hypotensive and having side effects of low blood pressure with lightheaded dizziness and amlodipine was stopped losartan was continued.  Patient since that time is noted intermittently elevated blood pressure as it is today. No symptoms of bradycardia no lightheaded dizzy type spells. Patient was given prednisone for his bronchitis URI.  Is taking last dose today and this may contribute to tell faded blood pressure readings but patient's had other elevated blood pressure readings prior to prednisone.  Patient also with gout medication with no gout symptoms. Also taking cholesterol medication without problems  URI also resolving.  Relevant past medical, surgical, family and social history reviewed and updated as indicated. Interim medical history since our last visit reviewed. Allergies and medications reviewed and updated.  Review of Systems  Constitutional: Negative.   HENT: Negative.   Eyes: Negative.   Respiratory: Negative.   Cardiovascular: Negative.   Gastrointestinal: Negative.   Endocrine: Negative.   Genitourinary: Negative.   Musculoskeletal: Negative.   Skin: Negative.   Allergic/Immunologic: Negative.   Neurological: Negative.   Hematological:  Negative.   Psychiatric/Behavioral: Negative.     Per HPI unless specifically indicated above     Objective:    BP (!) 175/84 (BP Location: Left Arm, Cuff Size: Normal)   Pulse (!) 43   Temp 98.4 F (36.9 C) (Oral)   Ht 5' 7.2" (1.707 m)   Wt 221 lb 9.6 oz (100.5 kg)   SpO2 97%   BMI 34.50 kg/m   Wt Readings from Last 3 Encounters:  03/03/18 221 lb 9.6 oz (100.5 kg)  10/17/17 218 lb 4.8 oz (99 kg)  08/22/17 223 lb 6.4 oz (101.3 kg)    Physical Exam  Constitutional: He is oriented to person, place, and time. He appears well-developed and well-nourished.  HENT:  Head: Normocephalic and atraumatic.  Right Ear: External ear normal.  Left Ear: External ear normal.  Eyes: Pupils are equal, round, and reactive to light. Conjunctivae and EOM are normal.  Neck: Normal range of motion. Neck supple.  Cardiovascular: Normal rate, regular rhythm, normal heart sounds and intact distal pulses.  Pulmonary/Chest: Effort normal and breath sounds normal.  Abdominal: Soft. Bowel sounds are normal. There is no splenomegaly or hepatomegaly.  Genitourinary: Rectum normal, prostate normal and penis normal.  Musculoskeletal: Normal range of motion.  Neurological: He is alert and oriented to person, place, and time. He has normal reflexes.  Skin: No rash noted. No erythema.  Psychiatric: He has a normal mood and affect. His behavior is normal. Judgment and thought content normal.    Results for orders placed or performed in visit on  40/98/11  Basic metabolic panel  Result Value Ref Range   Glucose 113 (H) 65 - 99 mg/dL   BUN 16 8 - 27 mg/dL   Creatinine, Ser 0.82 0.76 - 1.27 mg/dL   GFR calc non Af Amer 96 >59 mL/min/1.73   GFR calc Af Amer 111 >59 mL/min/1.73   BUN/Creatinine Ratio 20 10 - 24   Sodium 141 134 - 144 mmol/L   Potassium 4.1 3.5 - 5.2 mmol/L   Chloride 103 96 - 106 mmol/L   CO2 23 20 - 29 mmol/L   Calcium 9.1 8.6 - 10.2 mg/dL  LP+ALT+AST Piccolo, Waived  Result Value Ref  Range   ALT (SGPT) Piccolo, Waived 31 10 - 47 U/L   AST (SGOT) Piccolo, Waived 30 11 - 38 U/L   Cholesterol Piccolo, Waived 184 <200 mg/dL   HDL Chol Piccolo, Waived 49 (L) >59 mg/dL   Triglycerides Piccolo,Waived 247 (H) <150 mg/dL   Chol/HDL Ratio Piccolo,Waive 3.7 mg/dL   LDL Chol Calc Piccolo Waived 85 <100 mg/dL   VLDL Chol Calc Piccolo,Waive 49 (H) <30 mg/dL      Assessment & Plan:   Problem List Items Addressed This Visit      Cardiovascular and Mediastinum   Hypertension    Discussed with patient patient's blood pressures intermittently elevated has been taking some low-dose amlodipine will not take amlodipine because of bradycardia will use hydrochlorothiazide 12.5 mg 1 a day.  Patient will follow his blood pressure and if not adequately controlled may need to increase to 25 mg.      Relevant Medications   losartan (COZAAR) 100 MG tablet   atorvastatin (LIPITOR) 40 MG tablet   ezetimibe (ZETIA) 10 MG tablet   hydrochlorothiazide (HYDRODIURIL) 12.5 MG tablet     Respiratory   Sleep apnea    The current medical regimen is effective;  continue present plan and medications.         Musculoskeletal and Integument   Neck arthritis    Followed by physiatry      Relevant Medications   allopurinol (ZYLOPRIM) 300 MG tablet   naproxen (NAPROSYN) 500 MG tablet   DDD (degenerative disc disease), lumbar    Followed by physiatry      Relevant Medications   gabapentin (NEURONTIN) 300 MG capsule   allopurinol (ZYLOPRIM) 300 MG tablet   naproxen (NAPROSYN) 500 MG tablet     Other   High cholesterol    The current medical regimen is effective;  continue present plan and medications.       Relevant Medications   losartan (COZAAR) 100 MG tablet   atorvastatin (LIPITOR) 40 MG tablet   ezetimibe (ZETIA) 10 MG tablet   hydrochlorothiazide (HYDRODIURIL) 12.5 MG tablet   Gout    The current medical regimen is effective;  continue present plan and medications.        Relevant Medications   allopurinol (ZYLOPRIM) 300 MG tablet   naproxen (NAPROSYN) 500 MG tablet   Other Relevant Orders   Uric acid   Bradycardia    stable       Other Visit Diagnoses    Routine general medical examination at a health care facility    -  Primary   Relevant Orders   CBC with Differential/Platelet   Comprehensive metabolic panel   TSH   Lipid panel   PSA   Urinalysis, Routine w reflex microscopic       Follow up plan: Return in about 6 months (around 09/01/2018).

## 2018-03-03 NOTE — Assessment & Plan Note (Signed)
Followed by physiatry.   

## 2018-03-03 NOTE — Assessment & Plan Note (Signed)
stable °

## 2018-03-03 NOTE — Assessment & Plan Note (Signed)
Discussed with patient patient's blood pressures intermittently elevated has been taking some low-dose amlodipine will not take amlodipine because of bradycardia will use hydrochlorothiazide 12.5 mg 1 a day.  Patient will follow his blood pressure and if not adequately controlled may need to increase to 25 mg.

## 2018-03-04 ENCOUNTER — Telehealth: Payer: Self-pay | Admitting: Family Medicine

## 2018-03-04 DIAGNOSIS — R7989 Other specified abnormal findings of blood chemistry: Secondary | ICD-10-CM

## 2018-03-04 DIAGNOSIS — D72829 Elevated white blood cell count, unspecified: Secondary | ICD-10-CM

## 2018-03-04 LAB — COMPREHENSIVE METABOLIC PANEL
A/G RATIO: 1.8 (ref 1.2–2.2)
ALT: 34 IU/L (ref 0–44)
AST: 23 IU/L (ref 0–40)
Albumin: 4.2 g/dL (ref 3.6–4.8)
Alkaline Phosphatase: 58 IU/L (ref 39–117)
BUN/Creatinine Ratio: 16 (ref 10–24)
BUN: 15 mg/dL (ref 8–27)
Bilirubin Total: 0.2 mg/dL (ref 0.0–1.2)
CALCIUM: 9.1 mg/dL (ref 8.6–10.2)
CO2: 22 mmol/L (ref 20–29)
Chloride: 100 mmol/L (ref 96–106)
Creatinine, Ser: 0.92 mg/dL (ref 0.76–1.27)
GFR, EST AFRICAN AMERICAN: 104 mL/min/{1.73_m2} (ref >59)
GFR, EST NON AFRICAN AMERICAN: 90 mL/min/{1.73_m2} (ref >59)
GLOBULIN, TOTAL: 2.3 g/dL (ref 1.5–4.5)
Glucose: 116 mg/dL — ABNORMAL HIGH (ref 65–99)
POTASSIUM: 4 mmol/L (ref 3.5–5.2)
SODIUM: 141 mmol/L (ref 134–144)
Total Protein: 6.5 g/dL (ref 6.0–8.5)

## 2018-03-04 LAB — LIPID PANEL
CHOLESTEROL TOTAL: 203 mg/dL — AB (ref 100–199)
Chol/HDL Ratio: 3.3 ratio (ref 0.0–5.0)
HDL: 61 mg/dL (ref >39)
LDL CALC: 120 mg/dL — AB (ref 0–99)
TRIGLYCERIDES: 112 mg/dL (ref 0–149)
VLDL Cholesterol Cal: 22 mg/dL (ref 5–40)

## 2018-03-04 LAB — CBC WITH DIFFERENTIAL/PLATELET
BASOS: 0 %
Basophils Absolute: 0.1 10*3/uL (ref 0.0–0.2)
EOS (ABSOLUTE): 0 10*3/uL (ref 0.0–0.4)
EOS: 0 %
Hematocrit: 38.7 % (ref 37.5–51.0)
Hemoglobin: 13.1 g/dL (ref 13.0–17.7)
IMMATURE GRANULOCYTES: 1 %
Immature Grans (Abs): 0.1 10*3/uL (ref 0.0–0.1)
LYMPHS ABS: 1.4 10*3/uL (ref 0.7–3.1)
Lymphs: 12 %
MCH: 31.6 pg (ref 26.6–33.0)
MCHC: 33.9 g/dL (ref 31.5–35.7)
MCV: 94 fL (ref 79–97)
MONOS ABS: 0.7 10*3/uL (ref 0.1–0.9)
Monocytes: 6 %
NEUTROS ABS: 9.4 10*3/uL — AB (ref 1.4–7.0)
NEUTROS PCT: 81 %
Platelets: 275 10*3/uL (ref 150–450)
RBC: 4.14 x10E6/uL (ref 4.14–5.80)
RDW: 13.3 % (ref 12.3–15.4)
WBC: 11.6 10*3/uL — ABNORMAL HIGH (ref 3.4–10.8)

## 2018-03-04 LAB — PSA: Prostate Specific Ag, Serum: 0.4 ng/mL (ref 0.0–4.0)

## 2018-03-04 LAB — TSH: TSH: 0.338 u[IU]/mL — ABNORMAL LOW (ref 0.450–4.500)

## 2018-03-04 LAB — URIC ACID: Uric Acid: 4.4 mg/dL (ref 3.7–8.6)

## 2018-03-04 NOTE — Telephone Encounter (Signed)
Phone call Discussed with patient getting over this head cold WBC slightly elevated will observe for now and recheck CBC in a couple of weeks. Patient's TSH is suppressed will recheck TSH in a couple of weeks also.

## 2018-03-19 ENCOUNTER — Other Ambulatory Visit: Payer: 59

## 2018-03-19 DIAGNOSIS — D72829 Elevated white blood cell count, unspecified: Secondary | ICD-10-CM | POA: Diagnosis not present

## 2018-03-19 DIAGNOSIS — R7989 Other specified abnormal findings of blood chemistry: Secondary | ICD-10-CM

## 2018-03-20 LAB — CBC WITH DIFFERENTIAL/PLATELET
BASOS: 1 %
Basophils Absolute: 0.1 10*3/uL (ref 0.0–0.2)
EOS (ABSOLUTE): 0.2 10*3/uL (ref 0.0–0.4)
Eos: 3 %
Hematocrit: 37.8 % (ref 37.5–51.0)
Hemoglobin: 13.1 g/dL (ref 13.0–17.7)
IMMATURE GRANS (ABS): 0 10*3/uL (ref 0.0–0.1)
Immature Granulocytes: 0 %
LYMPHS ABS: 1.8 10*3/uL (ref 0.7–3.1)
LYMPHS: 22 %
MCH: 31.6 pg (ref 26.6–33.0)
MCHC: 34.7 g/dL (ref 31.5–35.7)
MCV: 91 fL (ref 79–97)
MONOCYTES: 7 %
Monocytes Absolute: 0.6 10*3/uL (ref 0.1–0.9)
NEUTROS ABS: 5.5 10*3/uL (ref 1.4–7.0)
Neutrophils: 67 %
Platelets: 227 10*3/uL (ref 150–450)
RBC: 4.15 x10E6/uL (ref 4.14–5.80)
RDW: 13.2 % (ref 12.3–15.4)
WBC: 8.2 10*3/uL (ref 3.4–10.8)

## 2018-03-20 LAB — TSH: TSH: 1.26 u[IU]/mL (ref 0.450–4.500)

## 2018-03-22 ENCOUNTER — Encounter: Payer: Self-pay | Admitting: Family Medicine

## 2018-03-24 DIAGNOSIS — M5416 Radiculopathy, lumbar region: Secondary | ICD-10-CM | POA: Diagnosis not present

## 2018-03-24 DIAGNOSIS — M5136 Other intervertebral disc degeneration, lumbar region: Secondary | ICD-10-CM | POA: Diagnosis not present

## 2018-03-25 ENCOUNTER — Telehealth: Payer: Self-pay | Admitting: Family Medicine

## 2018-03-25 NOTE — Telephone Encounter (Signed)
Called and spoke with patient about TSH. Relayed that results were normal.

## 2018-03-25 NOTE — Telephone Encounter (Signed)
Copied from [place] [id number]. Topic: Quick Communication - See Telephone Encounter >> Mar 25, 2018  1:14 PM Rutherford Nail, NT wrote: CRM for notification. See Telephone encounter for: 03/25/18. Patient would like a call to go over lab results from 03/19/18. Please advise.

## 2018-04-10 ENCOUNTER — Ambulatory Visit
Admission: RE | Admit: 2018-04-10 | Discharge: 2018-04-10 | Disposition: A | Discharge status: Home / Self Care | Payer: 59 | Source: Ambulatory Visit | Attending: Family Medicine | Admitting: Family Medicine

## 2018-04-10 ENCOUNTER — Ambulatory Visit
Admission: RE | Admit: 2018-04-10 | Discharge: 2018-04-10 | Disposition: A | Discharge status: Home / Self Care | Payer: 59 | Attending: Family Medicine | Admitting: Family Medicine

## 2018-04-10 ENCOUNTER — Encounter: Payer: Self-pay | Admitting: Family Medicine

## 2018-04-10 ENCOUNTER — Ambulatory Visit (INDEPENDENT_AMBULATORY_CARE_PROVIDER_SITE_OTHER): Payer: 59 | Admitting: Family Medicine

## 2018-04-10 VITALS — BP 146/79 | HR 51 | Temp 98.1°F | Wt 223.2 lb

## 2018-04-10 DIAGNOSIS — R0602 Shortness of breath: Secondary | ICD-10-CM

## 2018-04-10 DIAGNOSIS — R6883 Chills (without fever): Secondary | ICD-10-CM | POA: Diagnosis not present

## 2018-04-10 DIAGNOSIS — J069 Acute upper respiratory infection, unspecified: Secondary | ICD-10-CM

## 2018-04-10 MED ORDER — AZITHROMYCIN 250 MG PO TABS: ORAL_TABLET | ORAL | 0 refills | Status: DC

## 2018-04-10 MED ORDER — ALBUTEROL SULFATE (2.5 MG/3ML) 0.083% IN NEBU
2.5000 mg | INHALATION_SOLUTION | Freq: Once | RESPIRATORY_TRACT | Status: AC
  Administered 2018-04-10: 2.5 mg via RESPIRATORY_TRACT

## 2018-04-10 MED ORDER — HYDROCOD POLST-CPM POLST ER 10-8 MG/5ML PO SUER: 5.0000 mL | Freq: Every evening | ORAL | 0 refills | Status: DC | PRN

## 2018-04-10 MED ORDER — PREDNISONE 20 MG PO TABS: 40.0000 mg | ORAL_TABLET | Freq: Every day | ORAL | 0 refills | Status: DC

## 2018-04-10 MED ORDER — BENZONATATE 200 MG PO CAPS: 200.0000 mg | ORAL_CAPSULE | Freq: Three times a day (TID) | ORAL | 0 refills | Status: DC | PRN

## 2018-04-10 NOTE — Progress Notes (Signed)
BP (!) 146/79   Pulse (!) 51   Temp 98.1 F (36.7 C) (Oral)   Wt 223 lb 3.2 oz (101.2 kg)   SpO2 95%   BMI 34.75 kg/m    Subjective:    Patient ID: Daniel Cook, male    DOB: 09-01-1955, 63 y.o.   MRN: [id number]  HPI: Daniel Cook is a 63 y.o. male  Chief Complaint  Patient presents with  . Bronchitis    pt states he was diagnosed with bronchitis at urgent care 6 weeks ago. States he has taken 5 different things and is still having a cough and chest congestion   Went to UC 6 weeks ago and dx'd with bronchitis and given inhaler, tessalon, tussionex, amoxil with no improvement. Not feeling any better, has been taking mucinex DM with minimal relief. Still having productive cough, fatigue, congestion, SOB. Unsure if he's had fevers. No hx of pulmonary dz, non smoker.   Relevant past medical, surgical, family and social history reviewed and updated as indicated. Interim medical history since our last visit reviewed. Allergies and medications reviewed and updated.  Review of Systems  Per HPI unless specifically indicated above     Objective:    BP (!) 146/79   Pulse (!) 51   Temp 98.1 F (36.7 C) (Oral)   Wt 223 lb 3.2 oz (101.2 kg)   SpO2 95%   BMI 34.75 kg/m   Wt Readings from Last 3 Encounters:  04/10/18 223 lb 3.2 oz (101.2 kg)  03/03/18 221 lb 9.6 oz (100.5 kg)  10/17/17 218 lb 4.8 oz (99 kg)    Physical Exam Vitals signs and nursing note reviewed.  Constitutional:      Appearance: He is well-developed.  HENT:     Head: Atraumatic.     Right Ear: External ear normal.     Left Ear: External ear normal.     Nose: Congestion present.     Mouth/Throat:     Mouth: Mucous membranes are moist.     Pharynx: Posterior oropharyngeal erythema present. No oropharyngeal exudate.  Eyes:     Conjunctiva/sclera: Conjunctivae normal.     Pupils: Pupils are equal, round, and reactive to light.  Neck:     Musculoskeletal: Normal range of motion and neck  supple.  Cardiovascular:     Rate and Rhythm: Normal rate and regular rhythm.  Pulmonary:     Effort: Pulmonary effort is normal. No respiratory distress.     Breath sounds: Wheezing present. No rales.  Musculoskeletal: Normal range of motion.  Lymphadenopathy:     Cervical: No cervical adenopathy.  Skin:    General: Skin is warm and dry.  Neurological:     Mental Status: He is alert and oriented to person, place, and time.  Psychiatric:        Behavior: Behavior normal.    Results for orders placed or performed in visit on 03/19/18  CBC with Differential/Platelet  Result Value Ref Range   WBC 8.2 3.4 - 10.8 x10E3/uL   RBC 4.15 4.14 - 5.80 x10E6/uL   Hemoglobin 13.1 13.0 - 17.7 g/dL   Hematocrit 37.8 37.5 - 51.0 %   MCV 91 79 - 97 fL   MCH 31.6 26.6 - 33.0 pg   MCHC 34.7 31.5 - 35.7 g/dL   RDW 13.2 12.3 - 15.4 %   Platelets 227 150 - 450 x10E3/uL   Neutrophils 67 Not Estab. %   Lymphs 22 Not Estab. %  Monocytes 7 Not Estab. %   Eos 3 Not Estab. %   Basos 1 Not Estab. %   Neutrophils Absolute 5.5 1.4 - 7.0 x10E3/uL   Lymphocytes Absolute 1.8 0.7 - 3.1 x10E3/uL   Monocytes Absolute 0.6 0.1 - 0.9 x10E3/uL   EOS (ABSOLUTE) 0.2 0.0 - 0.4 x10E3/uL   Basophils Absolute 0.1 0.0 - 0.2 x10E3/uL   Immature Granulocytes 0 Not Estab. %   Immature Grans (Abs) 0.0 0.0 - 0.1 x10E3/uL  TSH  Result Value Ref Range   TSH 1.260 0.450 - 4.500 uIU/mL      Assessment & Plan:   Problem List Items Addressed This Visit    None    Visit Diagnoses    Upper respiratory tract infection, unspecified type    -  Primary   Mild improvement with in house neb tx, await CXR. Tx with zpak, prednisone, tessalon, tussionex, plain mucinex, supportive care   Relevant Medications   azithromycin (ZITHROMAX) 250 MG tablet   SOB (shortness of breath)       Relevant Medications   albuterol (PROVENTIL) (2.5 MG/3ML) 0.083% nebulizer solution 2.5 mg   Other Relevant Orders   DG Chest 2 View        Follow up plan: Return in about 1 week (around 04/17/2018) for Lung recheck.

## 2018-04-14 ENCOUNTER — Telehealth: Payer: Self-pay | Admitting: Family Medicine

## 2018-04-14 DIAGNOSIS — E669 Obesity, unspecified: Secondary | ICD-10-CM | POA: Diagnosis not present

## 2018-04-14 DIAGNOSIS — G4733 Obstructive sleep apnea (adult) (pediatric): Secondary | ICD-10-CM | POA: Diagnosis not present

## 2018-04-14 DIAGNOSIS — E782 Mixed hyperlipidemia: Secondary | ICD-10-CM | POA: Diagnosis not present

## 2018-04-14 NOTE — Telephone Encounter (Signed)
Copied from [place] [id number]. Topic: Quick Communication - Rx Refill/Question >> Apr 14, 2018  2:45 PM Reyne Dumas L wrote: Medication: albuterol (PROVENTIL HFA;VENTOLIN HFA) 108 (90 Base) MCG/ACT inhaler  Has the patient contacted their pharmacy? Yes - medication not there.  Pt thought that at visit a ventolin inhaler was going to be sent to pharmacy for him. (Agent: If no, request that the patient contact the pharmacy for the refill.) (Agent: If yes, when and what did the pharmacy advise?)  Preferred Pharmacy (with phone number or street name): [id number] - [place], [place] - [address] [phone] (Phone) [phone] (Fax)  Agent: Please be advised that RX refills may take up to 3 business days. We ask that you follow-up with your pharmacy.

## 2018-04-14 NOTE — Telephone Encounter (Signed)
Requested medication (s) are due for refill today: yes  Requested medication (s) are on the active medication list: yes  Last refill:  07/17/15  Future visit scheduled: yes  Notes to clinic:  Pt requesting Albuterol inhaler OV note from the 04/10/18 stated Albuterol nebulizer   Requested Prescriptions  Pending Prescriptions Disp Refills   albuterol (PROVENTIL HFA;VENTOLIN HFA) 108 (90 Base) MCG/ACT inhaler 1 Inhaler 0    Sig: Inhale 1-2 puffs into the lungs every 6 (six) hours as needed for wheezing or shortness of breath.     Pulmonology:  Beta Agonists Failed - 04/14/2018  2:49 PM      Failed - One inhaler should last at least one month. If the patient is requesting refills earlier, contact the patient to check for uncontrolled symptoms.      Passed - Valid encounter within last 12 months    Recent Outpatient Visits          4 days ago Upper respiratory tract infection, unspecified type   [facility] American, [place]   1 month ago Routine general medical examination at a health care facility   Lyle, Jeannette How, MD   5 months ago Chest pain, unspecified type   [place], Ness, [place]   7 months ago Non-recurrent acute suppurative otitis media of left ear without spontaneous rupture of tympanic membrane   Candelaria, Blessing, DO   8 months ago Essential hypertension   Salvo, MD      Future Appointments            In 1 week Orene Desanctis, Lilia Argue, PA-C [facility], [place]   In 5 months Crissman, Jeannette How, MD [facility], [place]

## 2018-04-15 MED ORDER — ALBUTEROL SULFATE HFA 108 (90 BASE) MCG/ACT IN AERS: 1.0000 | INHALATION_SPRAY | Freq: Four times a day (QID) | RESPIRATORY_TRACT | 0 refills | Status: DC | PRN

## 2018-04-17 ENCOUNTER — Other Ambulatory Visit: Payer: Self-pay

## 2018-04-17 MED ORDER — HYDROCHLOROTHIAZIDE 12.5 MG PO TABS: 12.5000 mg | ORAL_TABLET | Freq: Every day | ORAL | 4 refills | Status: DC

## 2018-04-17 NOTE — Telephone Encounter (Signed)
Pt.notified

## 2018-04-17 NOTE — Telephone Encounter (Signed)
Pt called and stated that pharmacy did not receive fax. Pt would like this medication before the weekend if possible. Please advise [phone]

## 2018-04-17 NOTE — Telephone Encounter (Signed)
Fax from [facility] pharmacy for Hydrochlorothiazide CAP

## 2018-04-18 ENCOUNTER — Other Ambulatory Visit: Payer: Self-pay | Admitting: Family Medicine

## 2018-04-21 ENCOUNTER — Ambulatory Visit: Payer: 59 | Admitting: Family Medicine

## 2018-05-03 ENCOUNTER — Other Ambulatory Visit: Payer: Self-pay | Admitting: Family Medicine

## 2018-05-04 ENCOUNTER — Other Ambulatory Visit: Payer: Self-pay | Admitting: Family Medicine

## 2018-05-04 NOTE — Telephone Encounter (Signed)
Patient called, left VM to return call to the office if he is requesting another refill, last refill on 04/15/18, will need to discuss symptoms with a TN.

## 2018-05-05 NOTE — Telephone Encounter (Signed)
Refill request for Amlodipine 10mg   LRF 02/22/18. Not on current med profile.

## 2018-06-01 ENCOUNTER — Ambulatory Visit: Payer: Self-pay | Admitting: *Deleted

## 2018-06-01 ENCOUNTER — Other Ambulatory Visit: Payer: Self-pay | Admitting: Family Medicine

## 2018-06-01 MED ORDER — BENZONATATE 200 MG PO CAPS: 200.0000 mg | ORAL_CAPSULE | Freq: Three times a day (TID) | ORAL | 0 refills | Status: DC | PRN

## 2018-06-01 MED ORDER — ALBUTEROL SULFATE HFA 108 (90 BASE) MCG/ACT IN AERS: 1.0000 | INHALATION_SPRAY | Freq: Four times a day (QID) | RESPIRATORY_TRACT | 0 refills | Status: DC | PRN

## 2018-06-01 NOTE — Telephone Encounter (Addendum)
Please disregard earlier note that read patient will seek treatment at the ED. That note was sent in error to the incorrect PCP and did not apply to this patient's encounter.   Patient was seen by Merrie Roof on 04/10/18 with URI and prescribed ventolin inhaler, prednisone and tessalon perles. He was due for a recheck on 04/21/18-was a no show. He is calling in to request a refill on the ventolin and tessalon perles. Stated he improved while on the medications. Completed the inhaler a few days ago also completed the perles recently. Reports a dry lingering cough and some "cracking at times" with his breathing. Denies fever and chills. appointment made for Thursday as requested by patient.    Reason for Disposition . [1] Dry lingering cough AND [2] recent cold symptoms  (e.g., runny nose, fever)  Answer Assessment - Initial Assessment Questions 1. ONSET: "When did the cough begin?"      November 2. SEVERITY: "How bad is the cough today?"      Same-bothersome 3. RESPIRATORY DISTRESS: "Describe your breathing."     occassional 4. FEVER: "Do you have a fever?" If so, ask: "What is your temperature, how was it measured, and when did it start?"     no 5. SPUTUM: "Describe the color of your sputum" (e.g., clear, white, yellow, green), "Has there been any change recently?"     Light colored. 6. HEMOPTYSIS: "Are you coughing up any blood?" If so ask: "How much, flecks, streaks, tablespoons, etc.?"    denies 7. CARDIAC HISTORY: "Do you have any history of heart disease?" (e.g., heart attack, congestive heart failure)       8. LUNG HISTORY: "Do you have any history of lung disease?"  (e.g., pulmonary embolus, asthma, emphysema/COPD)     no 9. OTHER SYMPTOMS: "Do you have any other symptoms? (e.g., runny nose, wheezing, chest pain)     Denies CP, has a runny nose.  10. PREGNANCY: "Is there any chance you are pregnant?" "When was your last menstrual period?"       none 11. TRAVEL: "Have you traveled  out of the country in the last month?" (e.g., travel history, exposures)       no  Protocols used: COUGH - CHRONIC-A-AH

## 2018-06-01 NOTE — Telephone Encounter (Signed)
Patient has an appointment on 06/04/18 for follow-up on URI from 04/08/18. He is requesting refills for these medications. See triage encounter for today for further information if needed. Routing request to PCP for consideration.

## 2018-06-02 NOTE — Telephone Encounter (Signed)
Not our patient here at [facility]

## 2018-06-04 ENCOUNTER — Encounter: Payer: Self-pay | Admitting: Family Medicine

## 2018-06-04 ENCOUNTER — Ambulatory Visit (INDEPENDENT_AMBULATORY_CARE_PROVIDER_SITE_OTHER): Payer: 59 | Admitting: Family Medicine

## 2018-06-04 ENCOUNTER — Other Ambulatory Visit: Payer: Self-pay

## 2018-06-04 VITALS — BP 149/81 | HR 47 | Temp 98.5°F | Ht 68.0 in | Wt 225.0 lb

## 2018-06-04 DIAGNOSIS — R05 Cough: Secondary | ICD-10-CM

## 2018-06-04 DIAGNOSIS — R059 Cough, unspecified: Secondary | ICD-10-CM

## 2018-06-04 MED ORDER — BUDESONIDE-FORMOTEROL FUMARATE 160-4.5 MCG/ACT IN AERO: 2.0000 | INHALATION_SPRAY | Freq: Two times a day (BID) | RESPIRATORY_TRACT | 3 refills | Status: DC

## 2018-06-04 MED ORDER — ALBUTEROL SULFATE HFA 108 (90 BASE) MCG/ACT IN AERS: 1.0000 | INHALATION_SPRAY | Freq: Four times a day (QID) | RESPIRATORY_TRACT | 0 refills | Status: DC | PRN

## 2018-06-04 NOTE — Progress Notes (Signed)
BP (!) 149/81   Pulse (!) 47   Temp 98.5 F (36.9 C) (Oral)   Ht 5\' 8"  (1.727 m)   Wt 225 lb (102.1 kg)   SpO2 96%   BMI 34.21 kg/m    Subjective:    Patient ID: Daniel Cook, male    DOB: 04-27-1955, 63 y.o.   MRN: [id number]  HPI: Daniel Cook is a 63 y.o. male  Chief Complaint  Patient presents with  . Cough    Lungs recheck f/u   Here today with lingering dry cough for over a month since a URI. Denies current congestion, fevers, chills, body aches, CP, SOB. Completed zpak, prednisone, tessalon, tussionex when diagnosed and CXR came back negative for pneumonia. No known hx of asthma known, non smoker. Not currently trying anything for sxs.   Relevant past medical, surgical, family and social history reviewed and updated as indicated. Interim medical history since our last visit reviewed. Allergies and medications reviewed and updated.  Review of Systems  Per HPI unless specifically indicated above     Objective:    BP (!) 149/81   Pulse (!) 47   Temp 98.5 F (36.9 C) (Oral)   Ht 5\' 8"  (1.727 m)   Wt 225 lb (102.1 kg)   SpO2 96%   BMI 34.21 kg/m   Wt Readings from Last 3 Encounters:  06/04/18 225 lb (102.1 kg)  04/10/18 223 lb 3.2 oz (101.2 kg)  03/03/18 221 lb 9.6 oz (100.5 kg)    Physical Exam Vitals signs and nursing note reviewed.  Constitutional:      Appearance: Normal appearance.  HENT:     Head: Atraumatic.     Right Ear: Tympanic membrane normal.     Left Ear: Tympanic membrane normal.     Nose: Nose normal.     Mouth/Throat:     Mouth: Mucous membranes are moist.     Pharynx: Oropharynx is clear.  Eyes:     Extraocular Movements: Extraocular movements intact.     Conjunctiva/sclera: Conjunctivae normal.  Neck:     Musculoskeletal: Normal range of motion and neck supple.  Cardiovascular:     Rate and Rhythm: Normal rate and regular rhythm.  Pulmonary:     Effort: Pulmonary effort is normal. No respiratory distress.   Breath sounds: Normal breath sounds. No wheezing or rales.  Musculoskeletal: Normal range of motion.  Skin:    General: Skin is warm and dry.  Neurological:     General: No focal deficit present.     Mental Status: He is oriented to person, place, and time.  Psychiatric:        Mood and Affect: Mood normal.        Thought Content: Thought content normal.        Judgment: Judgment normal.     Results for orders placed or performed in visit on 03/19/18  CBC with Differential/Platelet  Result Value Ref Range   WBC 8.2 3.4 - 10.8 x10E3/uL   RBC 4.15 4.14 - 5.80 x10E6/uL   Hemoglobin 13.1 13.0 - 17.7 g/dL   Hematocrit 37.8 37.5 - 51.0 %   MCV 91 79 - 97 fL   MCH 31.6 26.6 - 33.0 pg   MCHC 34.7 31.5 - 35.7 g/dL   RDW 13.2 12.3 - 15.4 %   Platelets 227 150 - 450 x10E3/uL   Neutrophils 67 Not Estab. %   Lymphs 22 Not Estab. %   Monocytes 7 Not Estab. %  Eos 3 Not Estab. %   Basos 1 Not Estab. %   Neutrophils Absolute 5.5 1.4 - 7.0 x10E3/uL   Lymphocytes Absolute 1.8 0.7 - 3.1 x10E3/uL   Monocytes Absolute 0.6 0.1 - 0.9 x10E3/uL   EOS (ABSOLUTE) 0.2 0.0 - 0.4 x10E3/uL   Basophils Absolute 0.1 0.0 - 0.2 x10E3/uL   Immature Granulocytes 0 Not Estab. %   Immature Grans (Abs) 0.0 0.0 - 0.1 x10E3/uL  TSH  Result Value Ref Range   TSH 1.260 0.450 - 4.500 uIU/mL      Assessment & Plan:   Problem List Items Addressed This Visit    None    Visit Diagnoses    Cough    -  Primary   Post-infectious inflammation vs asthma. Spirometry not reliable given poor attempt skewing results. Start symbicort and albuterol x 2 weeks, f/u after   Relevant Orders   Spirometry with Graph (Completed)       Follow up plan: Return in about 2 weeks (around 06/18/2018) for Lung recheck.

## 2018-06-20 ENCOUNTER — Other Ambulatory Visit: Payer: Self-pay | Admitting: Family Medicine

## 2018-06-23 DIAGNOSIS — M5136 Other intervertebral disc degeneration, lumbar region: Secondary | ICD-10-CM | POA: Diagnosis not present

## 2018-06-23 DIAGNOSIS — M5416 Radiculopathy, lumbar region: Secondary | ICD-10-CM | POA: Diagnosis not present

## 2018-09-15 ENCOUNTER — Ambulatory Visit (INDEPENDENT_AMBULATORY_CARE_PROVIDER_SITE_OTHER): Payer: 59 | Admitting: Family Medicine

## 2018-09-15 ENCOUNTER — Encounter: Payer: Self-pay | Admitting: Family Medicine

## 2018-09-15 ENCOUNTER — Other Ambulatory Visit: Payer: Self-pay

## 2018-09-15 DIAGNOSIS — M1000 Idiopathic gout, unspecified site: Secondary | ICD-10-CM | POA: Diagnosis not present

## 2018-09-15 DIAGNOSIS — E7800 Pure hypercholesterolemia, unspecified: Secondary | ICD-10-CM

## 2018-09-15 DIAGNOSIS — I10 Essential (primary) hypertension: Secondary | ICD-10-CM

## 2018-09-15 MED ORDER — BUDESONIDE-FORMOTEROL FUMARATE 160-4.5 MCG/ACT IN AERO: 2.0000 | INHALATION_SPRAY | Freq: Two times a day (BID) | RESPIRATORY_TRACT | 3 refills | Status: DC

## 2018-09-15 NOTE — Assessment & Plan Note (Signed)
The current medical regimen is effective;  continue present plan and medications.  

## 2018-09-15 NOTE — Progress Notes (Signed)
BP 132/70   Wt 217 lb (98.4 kg)   BMI 32.99 kg/m    Subjective:    Patient ID: Daniel Cook, male    DOB: 21-Feb-1956, 63 y.o.   MRN: [id number]  HPI: Daniel Cook is a 63 y.o. male  Med check  Telemedicine using audio/video telecommunications for a synchronous communication visit. Today's visit due to COVID-19 isolation precautions I connected with and verified that I am speaking with the correct person using two identifiers.   I discussed the limitations, risks, security and privacy concerns of performing an evaluation and management service by telecommunication and the availability of in person appointments. I also discussed with the patient that there may be a patient responsible charge related to this service. The patient expressed understanding and agreed to proceed. The patient's location is home. I am at home.  Discuss with patient doing well no complaints blood pressure good control. No gout symptoms taking allopurinol without problems and cholesterol doing well.  Taking over-the-counter turmeric and vinegar which seems to help his energy level.  Relevant past medical, surgical, family and social history reviewed and updated as indicated. Interim medical history since our last visit reviewed. Allergies and medications reviewed and updated.  Review of Systems  Constitutional: Negative.   Respiratory: Negative.   Cardiovascular: Negative.     Per HPI unless specifically indicated above     Objective:    BP 132/70   Wt 217 lb (98.4 kg)   BMI 32.99 kg/m   Wt Readings from Last 3 Encounters:  09/15/18 217 lb (98.4 kg)  06/04/18 225 lb (102.1 kg)  04/10/18 223 lb 3.2 oz (101.2 kg)    Physical Exam  Results for orders placed or performed in visit on 03/19/18  CBC with Differential/Platelet  Result Value Ref Range   WBC 8.2 3.4 - 10.8 x10E3/uL   RBC 4.15 4.14 - 5.80 x10E6/uL   Hemoglobin 13.1 13.0 - 17.7 g/dL   Hematocrit 37.8 37.5 - 51.0 %   MCV 91 79 - 97 fL   MCH 31.6 26.6 - 33.0 pg   MCHC 34.7 31.5 - 35.7 g/dL   RDW 13.2 12.3 - 15.4 %   Platelets 227 150 - 450 x10E3/uL   Neutrophils 67 Not Estab. %   Lymphs 22 Not Estab. %   Monocytes 7 Not Estab. %   Eos 3 Not Estab. %   Basos 1 Not Estab. %   Neutrophils Absolute 5.5 1.4 - 7.0 x10E3/uL   Lymphocytes Absolute 1.8 0.7 - 3.1 x10E3/uL   Monocytes Absolute 0.6 0.1 - 0.9 x10E3/uL   EOS (ABSOLUTE) 0.2 0.0 - 0.4 x10E3/uL   Basophils Absolute 0.1 0.0 - 0.2 x10E3/uL   Immature Granulocytes 0 Not Estab. %   Immature Grans (Abs) 0.0 0.0 - 0.1 x10E3/uL  TSH  Result Value Ref Range   TSH 1.260 0.450 - 4.500 uIU/mL      Assessment & Plan:   Problem List Items Addressed This Visit      Cardiovascular and Mediastinum   Hypertension    The current medical regimen is effective;  continue present plan and medications.       Relevant Orders   Basic metabolic panel     Other   High cholesterol    The current medical regimen is effective;  continue present plan and medications.       Relevant Orders   LP+ALT+AST Piccolo, Waived   Gout    The current medical regimen is effective;  continue present plan and medications.         I discussed the assessment and treatment plan with the patient. The patient was provided an opportunity to ask questions and all were answered. The patient agreed with the plan and demonstrated an understanding of the instructions.   The patient was advised to call back or seek an in-person evaluation if the symptoms worsen or if the condition fails to improve as anticipated.   I provided 21+ minutes of time during this encounter.  Follow up plan: Return in about 6 months (around 03/17/2019) for Physical Exam.

## 2018-09-22 ENCOUNTER — Other Ambulatory Visit: Payer: Self-pay

## 2018-09-22 ENCOUNTER — Other Ambulatory Visit: Payer: 59

## 2018-09-22 DIAGNOSIS — E7800 Pure hypercholesterolemia, unspecified: Secondary | ICD-10-CM

## 2018-09-22 DIAGNOSIS — I10 Essential (primary) hypertension: Secondary | ICD-10-CM

## 2018-09-22 LAB — LP+ALT+AST PICCOLO, WAIVED
ALT (SGPT) Piccolo, Waived: 22 U/L (ref 10–47)
AST (SGOT) Piccolo, Waived: 17 U/L (ref 11–38)
Chol/HDL Ratio Piccolo,Waive: 3.4 mg/dL
Cholesterol Piccolo, Waived: 212 mg/dL — ABNORMAL HIGH (ref <200)
HDL Chol Piccolo, Waived: 61 mg/dL (ref >59)
LDL Chol Calc Piccolo Waived: 112 mg/dL — ABNORMAL HIGH (ref <100)
Triglycerides Piccolo,Waived: 192 mg/dL — ABNORMAL HIGH (ref <150)
VLDL Chol Calc Piccolo,Waive: 38 mg/dL — ABNORMAL HIGH (ref <30)

## 2018-09-23 LAB — BASIC METABOLIC PANEL
BUN/Creatinine Ratio: 23 (ref 10–24)
BUN: 21 mg/dL (ref 8–27)
CO2: 26 mmol/L (ref 20–29)
Calcium: 9.4 mg/dL (ref 8.6–10.2)
Chloride: 103 mmol/L (ref 96–106)
Creatinine, Ser: 0.9 mg/dL (ref 0.76–1.27)
GFR calc Af Amer: 106 mL/min/{1.73_m2} (ref >59)
GFR calc non Af Amer: 92 mL/min/{1.73_m2} (ref >59)
Glucose: 113 mg/dL — ABNORMAL HIGH (ref 65–99)
Potassium: 4 mmol/L (ref 3.5–5.2)
Sodium: 141 mmol/L (ref 134–144)

## 2018-09-25 ENCOUNTER — Encounter: Payer: Self-pay | Admitting: Family Medicine

## 2018-11-12 ENCOUNTER — Telehealth: Payer: Self-pay | Admitting: Family Medicine

## 2018-11-12 MED ORDER — ALBUTEROL SULFATE HFA 108 (90 BASE) MCG/ACT IN AERS: INHALATION_SPRAY | RESPIRATORY_TRACT | 1 refills | Status: DC

## 2018-11-12 NOTE — Telephone Encounter (Signed)
Medication Refill - Medication:  VENTOLIN HFA 108 (90 Base) MCG/ACT inhaler  Has the patient contacted their pharmacy? Yes advised to call  Preferred Pharmacy (with phone number or street name):  [id number] [place], [place] - [address] [phone] (Phone) [phone] (Fax)   Agent: Please be advised that RX refills may take up to 3 business days. We ask that you follow-up with your pharmacy.

## 2018-11-12 NOTE — Telephone Encounter (Signed)
Routing to provider  

## 2018-11-26 ENCOUNTER — Telehealth: Payer: Self-pay | Admitting: Family Medicine

## 2018-11-26 NOTE — Telephone Encounter (Signed)
Copied from [place] [id number]. Topic: General - Other >> Nov 26, 2018 10:11 AM Ivar Drape wrote: Reason for CRM:  Patient would like to be seen today if possible.  He is experiencing wheezing and coughing.

## 2018-11-26 NOTE — Telephone Encounter (Signed)
Pt called back in he states that he hadn't received a call yet so he has scheduled an appt with [facility]. I offered virtual with Dr Jeananne Rama, pt states that he'd rather be seen in person so he will go to [facility].

## 2018-11-26 NOTE — Telephone Encounter (Signed)
I don't have anything, and with his URI symptoms, he would need to be a virtual. I think Dr. Jeananne Rama has virtuals available today.

## 2018-11-26 NOTE — Telephone Encounter (Signed)
Noted  

## 2018-12-16 ENCOUNTER — Other Ambulatory Visit: Payer: Self-pay | Admitting: Family Medicine

## 2019-02-05 ENCOUNTER — Other Ambulatory Visit: Payer: Self-pay | Admitting: Family Medicine

## 2019-02-05 DIAGNOSIS — E7800 Pure hypercholesterolemia, unspecified: Secondary | ICD-10-CM

## 2019-02-05 DIAGNOSIS — M51369 Other intervertebral disc degeneration, lumbar region without mention of lumbar back pain or lower extremity pain: Secondary | ICD-10-CM

## 2019-02-05 DIAGNOSIS — M5136 Other intervertebral disc degeneration, lumbar region: Secondary | ICD-10-CM

## 2019-02-05 DIAGNOSIS — M1000 Idiopathic gout, unspecified site: Secondary | ICD-10-CM

## 2019-02-05 NOTE — Telephone Encounter (Signed)
Forwarding medication refill requests to PCP for review. 

## 2019-03-12 IMAGING — CR DG CHEST 2V
2 series · 2 of 2 positions shown · non-contrast
Comparison: 04/07/2014

CLINICAL DATA: Pt states he has had Anniecia cough x 6 weeks that seems
to be getting worse with SOB and diarrhea x 10 days. Chills,
fatigue. Hx exsmoker and bronchitis dx'ed 6 weeks ago

EXAM:
CHEST - 2 VIEW

[chest pa]
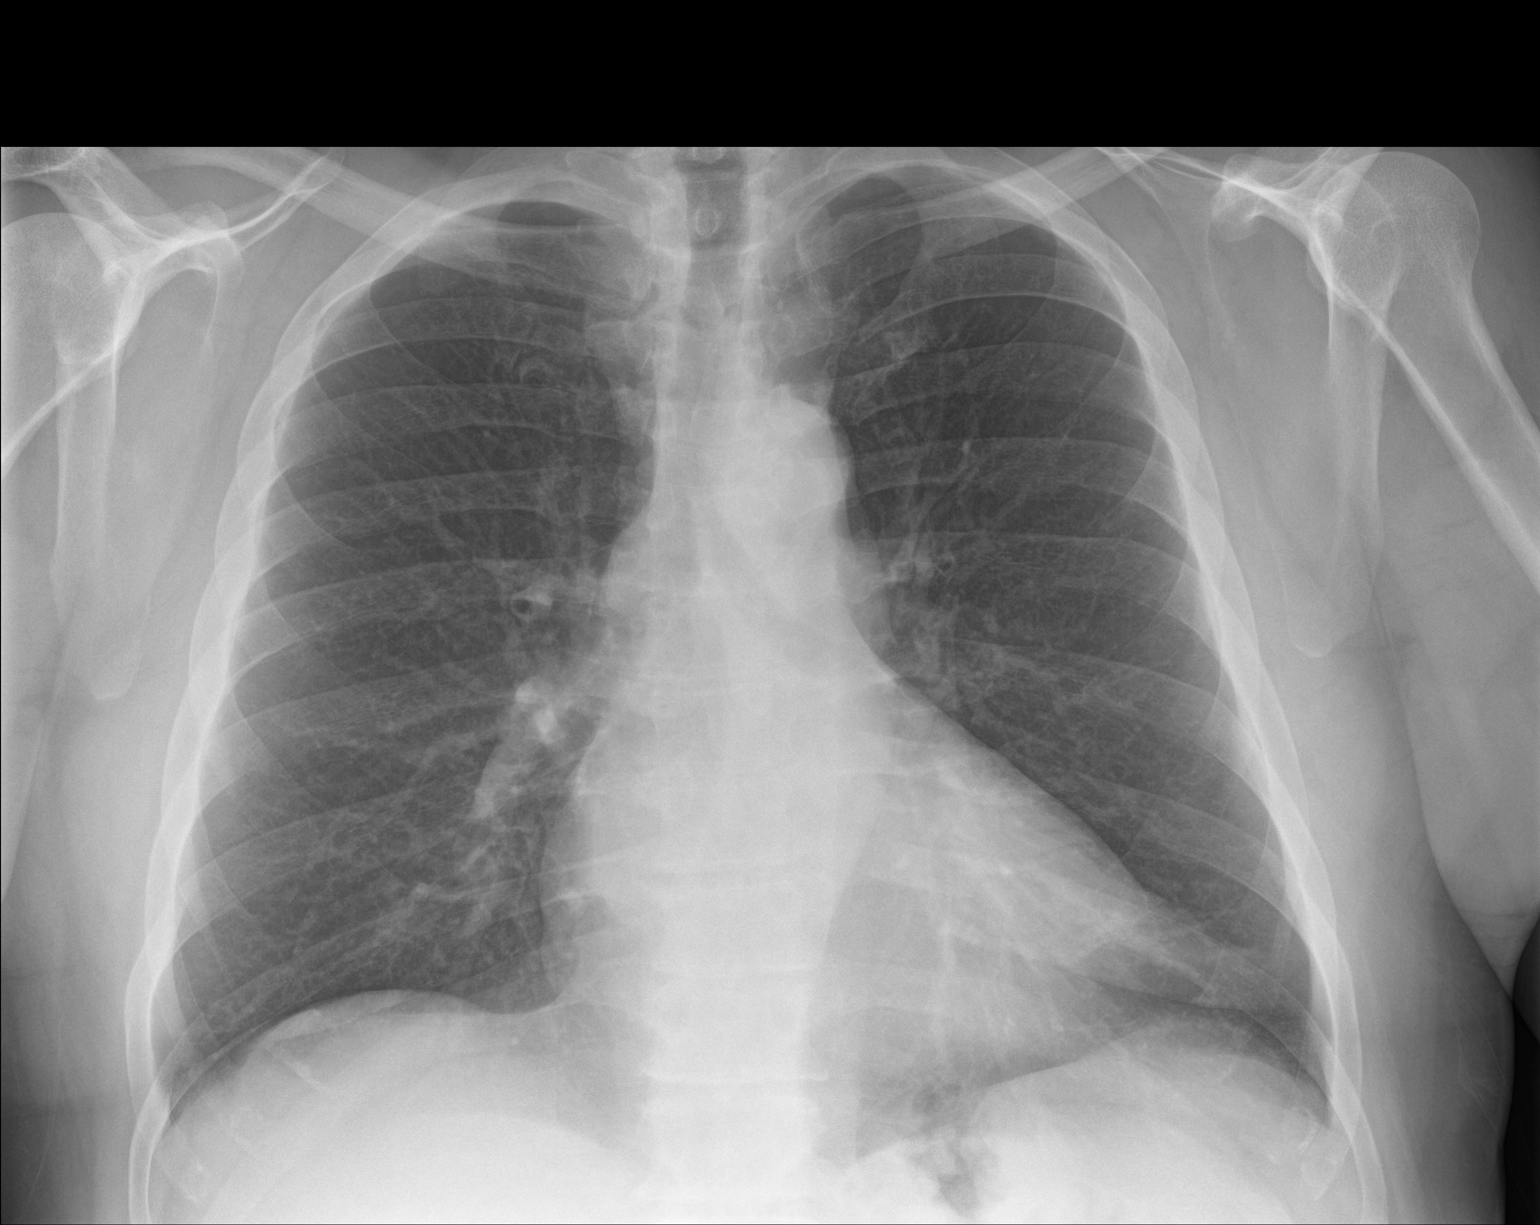

[chest lat]
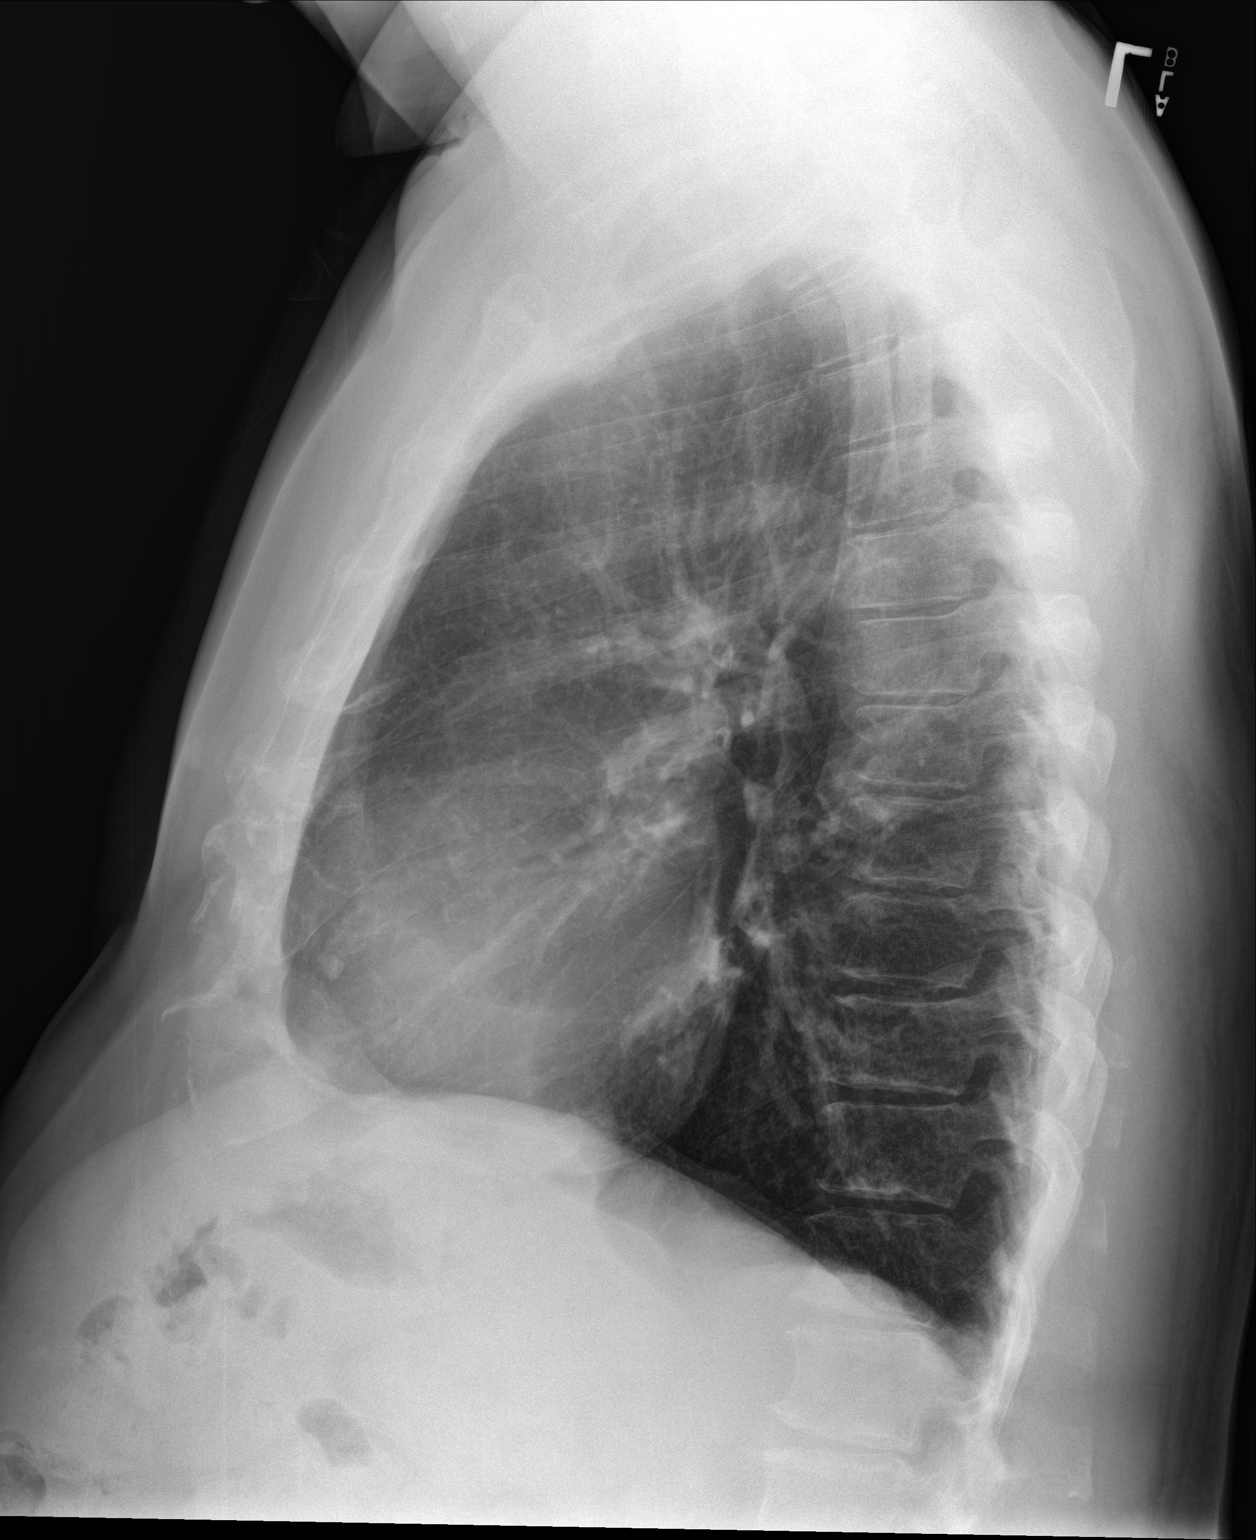

[2 of 2 positions shown; findings below may reference images not displayed]

FINDINGS: Lungs are clear.

Heart size upper limits normal.

No effusion.

Visualized bones unremarkable.
IMPRESSION: No acute cardiopulmonary disease.

## 2019-03-23 ENCOUNTER — Ambulatory Visit (INDEPENDENT_AMBULATORY_CARE_PROVIDER_SITE_OTHER): Payer: Self-pay | Admitting: Family Medicine

## 2019-03-23 ENCOUNTER — Other Ambulatory Visit: Payer: Self-pay

## 2019-03-23 DIAGNOSIS — E7800 Pure hypercholesterolemia, unspecified: Secondary | ICD-10-CM

## 2019-03-23 DIAGNOSIS — I10 Essential (primary) hypertension: Secondary | ICD-10-CM

## 2019-03-23 DIAGNOSIS — M5136 Other intervertebral disc degeneration, lumbar region: Secondary | ICD-10-CM

## 2019-03-23 DIAGNOSIS — M1000 Idiopathic gout, unspecified site: Secondary | ICD-10-CM

## 2019-03-23 MED ORDER — EZETIMIBE 10 MG PO TABS: 10.0000 mg | ORAL_TABLET | Freq: Every day | ORAL | 1 refills | Status: DC

## 2019-03-23 MED ORDER — AMLODIPINE BESYLATE 10 MG PO TABS: 10.0000 mg | ORAL_TABLET | Freq: Every day | ORAL | 1 refills | Status: DC

## 2019-03-23 MED ORDER — ALBUTEROL SULFATE HFA 108 (90 BASE) MCG/ACT IN AERS: INHALATION_SPRAY | RESPIRATORY_TRACT | 1 refills | Status: DC

## 2019-03-23 MED ORDER — COLCHICINE 0.6 MG PO TABS: 0.6000 mg | ORAL_TABLET | Freq: Every day | ORAL | 1 refills | Status: DC | PRN

## 2019-03-23 MED ORDER — OMEPRAZOLE 20 MG PO CPDR: 20.0000 mg | DELAYED_RELEASE_CAPSULE | Freq: Every day | ORAL | 1 refills | Status: DC

## 2019-03-23 MED ORDER — HYDROCHLOROTHIAZIDE 12.5 MG PO TABS: 12.5000 mg | ORAL_TABLET | Freq: Every day | ORAL | 1 refills | Status: DC

## 2019-03-23 MED ORDER — ALLOPURINOL 300 MG PO TABS: 300.0000 mg | ORAL_TABLET | Freq: Every day | ORAL | 1 refills | Status: DC

## 2019-03-23 MED ORDER — NAPROXEN 500 MG PO TABS: ORAL_TABLET | ORAL | 1 refills | Status: DC

## 2019-03-23 MED ORDER — ATORVASTATIN CALCIUM 40 MG PO TABS: 40.0000 mg | ORAL_TABLET | Freq: Every day | ORAL | 1 refills | Status: DC

## 2019-03-23 MED ORDER — BUDESONIDE-FORMOTEROL FUMARATE 160-4.5 MCG/ACT IN AERO: 2.0000 | INHALATION_SPRAY | Freq: Two times a day (BID) | RESPIRATORY_TRACT | 1 refills | Status: DC

## 2019-03-23 MED ORDER — GABAPENTIN 300 MG PO CAPS: ORAL_CAPSULE | ORAL | 1 refills | Status: DC

## 2019-03-23 MED ORDER — LORATADINE 10 MG PO TABS: 10.0000 mg | ORAL_TABLET | Freq: Every day | ORAL | 1 refills | Status: DC

## 2019-03-23 MED ORDER — LOSARTAN POTASSIUM 100 MG PO TABS: 100.0000 mg | ORAL_TABLET | Freq: Every day | ORAL | 4 refills | Status: DC

## 2019-03-23 NOTE — Progress Notes (Signed)
Insurance not active. Rescheduling for when he has insurance

## 2019-03-29 ENCOUNTER — Ambulatory Visit (INDEPENDENT_AMBULATORY_CARE_PROVIDER_SITE_OTHER): Payer: Self-pay | Admitting: Family Medicine

## 2019-03-29 ENCOUNTER — Telehealth: Payer: Self-pay | Admitting: Family Medicine

## 2019-03-29 ENCOUNTER — Encounter: Payer: Self-pay | Admitting: Family Medicine

## 2019-03-29 DIAGNOSIS — K0889 Other specified disorders of teeth and supporting structures: Secondary | ICD-10-CM

## 2019-03-29 MED ORDER — AMOXICILLIN-POT CLAVULANATE 875-125 MG PO TABS: 1.0000 | ORAL_TABLET | Freq: Two times a day (BID) | ORAL | 0 refills | Status: DC

## 2019-03-29 MED ORDER — HYDROCODONE-ACETAMINOPHEN 5-325 MG PO TABS: ORAL_TABLET | ORAL | 0 refills | Status: DC

## 2019-03-29 NOTE — Telephone Encounter (Signed)
Called pt to schedule appt, he states that his dentist appt was moved to tomorrow morning and he is wanting to see if amoxicillin could be called in. I did inform him that we do not have any openings today and [facility] is an option. Please advise.

## 2019-03-29 NOTE — Telephone Encounter (Signed)
Patient called and would like to know if someone can prescribe him something for toothache pain. He has an appt on Monday with his dentist. Please call patient back, thanks

## 2019-03-29 NOTE — Progress Notes (Signed)
There were no vitals taken for this visit.   Subjective:    Patient ID: Daniel Cook, male    DOB: 23-Sep-1955, 63 y.o.   MRN: [id number]  HPI: Daniel Cook is a 63 y.o. male  Chief Complaint  Patient presents with  . Dental Pain    upper right    DENTAL PAIN Duration: on Saturday and hasn't stopped Involved teeth: right and upper Dentist evaluation: no Mechanism of injury:  no trauma Onset: sudden Severity: severe Quality: throbbing, aching Frequency: constant Radiation: no Aggravating factors: cold and chewing Alleviating factors: nothing Status: worse Treatments attempted: ice and NSAIDs Relief with NSAIDs?: mild Fevers: no Swelling: yes Redness: yes Paresthesias / decreased sensation: no Sinus pressure: no  Relevant past medical, surgical, family and social history reviewed and updated as indicated. Interim medical history since our last visit reviewed. Allergies and medications reviewed and updated.  Review of Systems  Constitutional: Negative.   HENT: Positive for dental problem. Negative for congestion, drooling, ear discharge, ear pain, facial swelling, hearing loss, mouth sores, nosebleeds, postnasal drip, rhinorrhea, sinus pressure, sinus pain, sneezing, sore throat, tinnitus, trouble swallowing and voice change.   Respiratory: Negative.   Cardiovascular: Negative.   Musculoskeletal: Negative.   Psychiatric/Behavioral: Negative.     Per HPI unless specifically indicated above     Objective:    There were no vitals taken for this visit.  Wt Readings from Last 3 Encounters:  09/15/18 217 lb (98.4 kg)  06/04/18 225 lb (102.1 kg)  04/10/18 223 lb 3.2 oz (101.2 kg)    Physical Exam Vitals and nursing note reviewed.  Pulmonary:     Effort: Pulmonary effort is normal. No respiratory distress.     Comments: Speaking in full sentences Neurological:     Mental Status: He is alert.  Psychiatric:        Mood and Affect: Mood normal.       Behavior: Behavior normal.        Thought Content: Thought content normal.        Judgment: Judgment normal.     Results for orders placed or performed in visit on 09/22/18  LP+ALT+AST Piccolo, [employer]  Result Value Ref Range   ALT (SGPT) Piccolo, Waived 22 10 - 47 U/L   AST (SGOT) Piccolo, Waived 17 11 - 38 U/L   Cholesterol Piccolo, Waived 212 (H) <200 mg/dL   HDL Chol Piccolo, Waived 61 >59 mg/dL   Triglycerides Piccolo,Waived 192 (H) <150 mg/dL   Chol/HDL Ratio Piccolo,Waive 3.4 mg/dL   LDL Chol Calc Piccolo Waived 112 (H) <100 mg/dL   VLDL Chol Calc Piccolo,Waive 38 (H) <30 mg/dL  Basic metabolic panel  Result Value Ref Range   Glucose 113 (H) 65 - 99 mg/dL   BUN 21 8 - 27 mg/dL   Creatinine, Ser 0.90 0.76 - 1.27 mg/dL   GFR calc non Af Amer 92 >59 mL/min/1.73   GFR calc Af Amer 106 >59 mL/min/1.73   BUN/Creatinine Ratio 23 10 - 24   Sodium 141 134 - 144 mmol/L   Potassium 4.0 3.5 - 5.2 mmol/L   Chloride 103 96 - 106 mmol/L   CO2 26 20 - 29 mmol/L   Calcium 9.4 8.6 - 10.2 mg/dL      Assessment & Plan:   Problem List Items Addressed This Visit    None    Visit Diagnoses    Pain, dental    -  Primary   Seeing dentistry tomorrow. Will  treat with augmetin and short course of vicodin. Call if not getting better. Continue to monitor.        Follow up plan: Return if symptoms worsen or fail to improve.   . This visit was completed via telephone due to the restrictions of the COVID-19 pandemic. All issues as above were discussed and addressed but no physical exam was performed. If it was felt that the patient should be evaluated in the office, they were directed there. The patient verbally consented to this visit. Patient was unable to complete an audio/visual visit due to Lack of equipment. Due to the catastrophic nature of the COVID-19 pandemic, this visit was done through audio contact only. . Location of the patient: home . Location of the provider: work . Those  involved with this call:  . Provider: Park Liter, DO . CMA: Tiffany Reel, CMA . Front Desk/Registration: Don Perking  . Time spent on call: 15 minutes on the phone discussing health concerns. 23 minutes total spent in review of patient's record and preparation of their chart.

## 2019-03-30 NOTE — Telephone Encounter (Signed)
Seen today. 

## 2019-04-27 ENCOUNTER — Other Ambulatory Visit: Payer: Self-pay

## 2019-04-27 ENCOUNTER — Encounter: Payer: Self-pay | Admitting: Family Medicine

## 2019-04-27 ENCOUNTER — Ambulatory Visit (INDEPENDENT_AMBULATORY_CARE_PROVIDER_SITE_OTHER): Payer: BC Managed Care – PPO | Admitting: Family Medicine

## 2019-04-27 VITALS — BP 144/74 | HR 44 | Temp 98.6°F | Ht 67.0 in | Wt 214.0 lb

## 2019-04-27 DIAGNOSIS — Z125 Encounter for screening for malignant neoplasm of prostate: Secondary | ICD-10-CM | POA: Diagnosis not present

## 2019-04-27 DIAGNOSIS — M5136 Other intervertebral disc degeneration, lumbar region: Secondary | ICD-10-CM

## 2019-04-27 DIAGNOSIS — Z0000 Encounter for general adult medical examination without abnormal findings: Secondary | ICD-10-CM

## 2019-04-27 DIAGNOSIS — M51369 Other intervertebral disc degeneration, lumbar region without mention of lumbar back pain or lower extremity pain: Secondary | ICD-10-CM

## 2019-04-27 DIAGNOSIS — I10 Essential (primary) hypertension: Secondary | ICD-10-CM | POA: Diagnosis not present

## 2019-04-27 DIAGNOSIS — M1000 Idiopathic gout, unspecified site: Secondary | ICD-10-CM

## 2019-04-27 DIAGNOSIS — E7800 Pure hypercholesterolemia, unspecified: Secondary | ICD-10-CM

## 2019-04-27 DIAGNOSIS — D649 Anemia, unspecified: Secondary | ICD-10-CM

## 2019-04-27 LAB — UA/M W/RFLX CULTURE, ROUTINE
Bilirubin, UA: NEGATIVE
Glucose, UA: NEGATIVE
Ketones, UA: NEGATIVE
Leukocytes,UA: NEGATIVE
Nitrite, UA: NEGATIVE
Protein,UA: NEGATIVE
RBC, UA: NEGATIVE
Specific Gravity, UA: <1.005 — ABNORMAL LOW (ref 1.005–1.030)
Urobilinogen, Ur: 0.2 mg/dL (ref 0.2–1.0)
pH, UA: 6.5 (ref 5.0–7.5)

## 2019-04-27 LAB — MICROALBUMIN, URINE WAIVED
Creatinine, Urine Waived: 50 mg/dL (ref 10–300)
Microalb, Ur Waived: 10 mg/L (ref 0–19)
Microalb/Creat Ratio: <30 mg/g (ref <30)

## 2019-04-27 MED ORDER — HYDROCHLOROTHIAZIDE 12.5 MG PO TABS: 12.5000 mg | ORAL_TABLET | Freq: Every day | ORAL | 1 refills | Status: DC

## 2019-04-27 MED ORDER — ALBUTEROL SULFATE HFA 108 (90 BASE) MCG/ACT IN AERS: INHALATION_SPRAY | RESPIRATORY_TRACT | 6 refills | Status: DC

## 2019-04-27 MED ORDER — NAPROXEN 500 MG PO TABS: ORAL_TABLET | ORAL | 1 refills | Status: DC

## 2019-04-27 MED ORDER — EZETIMIBE 10 MG PO TABS: 10.0000 mg | ORAL_TABLET | Freq: Every day | ORAL | 1 refills | Status: DC

## 2019-04-27 MED ORDER — OMEPRAZOLE 20 MG PO CPDR: 20.0000 mg | DELAYED_RELEASE_CAPSULE | Freq: Every day | ORAL | 1 refills | Status: DC

## 2019-04-27 MED ORDER — AMLODIPINE BESYLATE 10 MG PO TABS: 10.0000 mg | ORAL_TABLET | Freq: Every day | ORAL | 1 refills | Status: DC

## 2019-04-27 MED ORDER — BUDESONIDE-FORMOTEROL FUMARATE 160-4.5 MCG/ACT IN AERO: 2.0000 | INHALATION_SPRAY | Freq: Two times a day (BID) | RESPIRATORY_TRACT | 3 refills | Status: DC

## 2019-04-27 MED ORDER — GABAPENTIN 300 MG PO CAPS: ORAL_CAPSULE | ORAL | 1 refills | Status: DC

## 2019-04-27 MED ORDER — ALLOPURINOL 300 MG PO TABS: 300.0000 mg | ORAL_TABLET | Freq: Every day | ORAL | 1 refills | Status: DC

## 2019-04-27 MED ORDER — ATORVASTATIN CALCIUM 40 MG PO TABS: 40.0000 mg | ORAL_TABLET | Freq: Every day | ORAL | 1 refills | Status: DC

## 2019-04-27 MED ORDER — LOSARTAN POTASSIUM 100 MG PO TABS: 100.0000 mg | ORAL_TABLET | Freq: Every day | ORAL | 1 refills | Status: DC

## 2019-04-27 NOTE — Progress Notes (Signed)
BP (!) 144/74   Pulse (!) 44   Temp 98.6 F (37 C) (Oral)   Ht 5\' 7"  (1.702 m)   Wt 214 lb (97.1 kg)   SpO2 95%   BMI 33.52 kg/m    Subjective:    Patient ID: Daniel Cook, male    DOB: [birth year], 64 y.o.   MRN: [id number]  HPI: Daniel Cook is a 64 y.o. male presenting on 04/27/2019 for comprehensive medical examination. Current medical complaints include:  HYPERTENSION / HYPERLIPIDEMIA Satisfied with current treatment? yes Duration of hypertension: chronic BP monitoring frequency: not checking BP range:  BP medication side effects: no Past BP meds:  Duration of hyperlipidemia: chronic Cholesterol medication side effects: no Cholesterol supplements: none Past cholesterol medications: zetia Medication compliance: excellent compliance Aspirin: yes Recent stressors: no Recurrent headaches: no Visual changes: no Palpitations: no Dyspnea: no Chest pain: no Lower extremity edema: no Dizzy/lightheaded: no  GOUT- doing well. Tolerating the medicine. No gout flares.   Interim Problems from his last visit: no  Depression Screen done today and results listed below:  Depression screen [facility] 2/9 04/27/2019 03/29/2019 08/22/2017 08/05/2017 11/21/2015  Decreased Interest 0 0 0 0 0  Down, Depressed, Hopeless 0 0 0 0 0  PHQ - 2 Score 0 0 0 0 0  Altered sleeping 0 - - - -  Tired, decreased energy 0 - - - -  Change in appetite 0 - - - -  Feeling bad or failure about yourself  0 - - - -  Trouble concentrating 0 - - - -  Moving slowly or fidgety/restless 0 - - - -  Suicidal thoughts 0 - - - -  PHQ-9 Score 0 - - - -  Difficult doing work/chores Not difficult at all - - - -    Past Medical History:  Past Medical History:  Diagnosis Date  . Gout   . High cholesterol   . Hypertension   . Obesity   . Sleep apnea     Surgical History:  Past Surgical History:  Procedure Laterality Date  . knee surgeries      Medications:  Current Outpatient Medications on  File Prior to Visit  Medication Sig  . aspirin 81 MG tablet Take 325 mg by mouth daily.   . colchicine 0.6 MG tablet Take 1 tablet (0.6 mg total) by mouth daily as needed.  Marland Kitchen HYDROcodone-acetaminophen (NORCO/VICODIN) 5-325 MG tablet 1 po tid prn  . loratadine (CLARITIN) 10 MG tablet Take 1 tablet (10 mg total) by mouth daily.  Marland Kitchen tiZANidine (ZANAFLEX) 4 MG tablet TAKE 1 TABLET BY MOUTH TWICE A DAY AS NEEDED   No current facility-administered medications on file prior to visit.    Allergies:  No Known Allergies  Social History:  Social History   Socioeconomic History  . Marital status: Divorced    Spouse name: Not on file  . Number of children: Not on file  . Years of education: Not on file  . Highest education level: Not on file  Occupational History  . Not on file  Tobacco Use  . Smoking status: Former Smoker    Types: Cigarettes    Quit date: 10/04/1998    Years since quitting: 20.5  . Smokeless tobacco: Former Systems developer    Types: [place] date: 03/22/2014  . Tobacco comment: dip occassionally  Substance and Sexual Activity  . Alcohol use: Yes    Comment: occ  . Drug use: No  Types: Marijuana  . Sexual activity: Not on file  Other Topics Concern  . Not on file  Social History Narrative  . Not on file   Social Determinants of Health   Financial Resource Strain:   . Difficulty of Paying Living Expenses: Not on file  Food Insecurity:   . Worried About Charity fundraiser in the Last Year: Not on file  . Ran Out of Food in the Last Year: Not on file  Transportation Needs:   . Lack of Transportation (Medical): Not on file  . Lack of Transportation (Non-Medical): Not on file  Physical Activity:   . Days of Exercise per Week: Not on file  . Minutes of Exercise per Session: Not on file  Stress:   . Feeling of Stress : Not on file  Social Connections:   . Frequency of Communication with Friends and Family: Not on file  . Frequency of Social Gatherings with  Friends and Family: Not on file  . Attends Religious Services: Not on file  . Active Member of Clubs or Organizations: Not on file  . Attends Archivist Meetings: Not on file  . Marital Status: Not on file  Intimate Partner Violence:   . Fear of Current or Ex-Partner: Not on file  . Emotionally Abused: Not on file  . Physically Abused: Not on file  . Sexually Abused: Not on file   Social History   Tobacco Use  Smoking Status Former Smoker  . Types: Cigarettes  . Quit date: 10/04/1998  . Years since quitting: 20.5  Smokeless Tobacco Former Systems developer  . Types: Chew  . Quit date: 03/22/2014  Tobacco Comment   dip occassionally   Social History   Substance and Sexual Activity  Alcohol Use Yes   Comment: occ    Family History:  History reviewed. No pertinent family history.  Past medical history, surgical history, medications, allergies, family history and social history reviewed with patient today and changes made to appropriate areas of the chart.   Review of Systems  Constitutional: Negative.   HENT: Negative.   Eyes: Positive for blurred vision (needs trifocals). Negative for double vision, photophobia, pain, discharge and redness.  Respiratory: Negative.   Cardiovascular: Negative.   Gastrointestinal: Positive for blood in stool. Negative for abdominal pain, constipation, diarrhea, heartburn, melena, nausea and vomiting.  Genitourinary: Negative.   Skin: Negative.     All other ROS negative except what is listed above and in the HPI.      Objective:    BP (!) 144/74   Pulse (!) 44   Temp 98.6 F (37 C) (Oral)   Ht 5\' 7"  (1.702 m)   Wt 214 lb (97.1 kg)   SpO2 95%   BMI 33.52 kg/m   Wt Readings from Last 3 Encounters:  04/27/19 214 lb (97.1 kg)  09/15/18 217 lb (98.4 kg)  06/04/18 225 lb (102.1 kg)    Physical Exam Vitals and nursing note reviewed.  Constitutional:      General: He is not in acute distress.    Appearance: Normal appearance. He  is obese. He is not ill-appearing, toxic-appearing or diaphoretic.  HENT:     Head: Normocephalic and atraumatic.     Right Ear: Tympanic membrane, ear canal and external ear normal. There is no impacted cerumen.     Left Ear: Tympanic membrane, ear canal and external ear normal. There is no impacted cerumen.     Nose: Nose normal. No congestion or rhinorrhea.  Mouth/Throat:     Mouth: Mucous membranes are moist.     Pharynx: Oropharynx is clear. No oropharyngeal exudate or posterior oropharyngeal erythema.  Eyes:     General: No scleral icterus.       Right eye: No discharge.        Left eye: No discharge.     Extraocular Movements: Extraocular movements intact.     Conjunctiva/sclera: Conjunctivae normal.     Pupils: Pupils are equal, round, and reactive to light.  Neck:     Vascular: No carotid bruit.  Cardiovascular:     Rate and Rhythm: Normal rate and regular rhythm.     Pulses: Normal pulses.     Heart sounds: No murmur. No friction rub. No gallop.   Pulmonary:     Effort: Pulmonary effort is normal. No respiratory distress.     Breath sounds: Normal breath sounds. No stridor. No wheezing, rhonchi or rales.  Chest:     Chest wall: No tenderness.  Abdominal:     General: Abdomen is flat. Bowel sounds are normal. There is no distension.     Palpations: Abdomen is soft. There is no mass.     Tenderness: There is no abdominal tenderness. There is no right CVA tenderness, left CVA tenderness, guarding or rebound.     Hernia: No hernia is present.  Genitourinary:    Comments: Genital exam deferred with shared decision making Musculoskeletal:        General: No swelling, tenderness, deformity or signs of injury.     Cervical back: Normal range of motion and neck supple. No rigidity. No muscular tenderness.     Right lower leg: No edema.     Left lower leg: No edema.  Lymphadenopathy:     Cervical: No cervical adenopathy.  Skin:    General: Skin is warm and dry.      Capillary Refill: Capillary refill takes less than 2 seconds.     Coloration: Skin is not jaundiced or pale.     Findings: No bruising, erythema, lesion or rash.  Neurological:     General: No focal deficit present.     Mental Status: He is alert and oriented to person, place, and time.     Cranial Nerves: No cranial nerve deficit.     Sensory: No sensory deficit.     Motor: No weakness.     Coordination: Coordination normal.     Gait: Gait normal.     Deep Tendon Reflexes: Reflexes normal.  Psychiatric:        Mood and Affect: Mood normal.        Behavior: Behavior normal.        Thought Content: Thought content normal.        Judgment: Judgment normal.     Results for orders placed or performed in visit on 04/27/19  Microalbumin, Urine Waived  Result Value Ref Range   Microalb, Ur Waived 10 0 - 19 mg/L   Creatinine, Urine Waived 50 10 - 300 mg/dL   Microalb/Creat Ratio <30 <30 mg/g  UA/M w/rflx Culture, Routine   Specimen: Urine   URINE  Result Value Ref Range   Specific Gravity, UA <1.005 (L) 1.005 - 1.030   pH, UA 6.5 5.0 - 7.5   Color, UA Yellow Yellow   Appearance Ur Clear Clear   Leukocytes,UA Negative Negative   Protein,UA Negative Negative/Trace   Glucose, UA Negative Negative   Ketones, UA Negative Negative   RBC, UA Negative Negative  Bilirubin, UA Negative Negative   Urobilinogen, Ur 0.2 0.2 - 1.0 mg/dL   Nitrite, UA Negative Negative      Assessment & Plan:   Problem List Items Addressed This Visit      Cardiovascular and Mediastinum   Hypertension    Under good control on current regimen. Continue current regimen. Continue to monitor. Call with any concerns. Refills given. Labs drawn today.       Relevant Medications   losartan (COZAAR) 100 MG tablet   hydrochlorothiazide (HYDRODIURIL) 12.5 MG tablet   ezetimibe (ZETIA) 10 MG tablet   atorvastatin (LIPITOR) 40 MG tablet   amLODipine (NORVASC) 10 MG tablet   Other Relevant Orders   CBC with  Differential/Platelet   Comprehensive metabolic panel   Microalbumin, Urine Waived (Completed)   TSH     Musculoskeletal and Integument   DDD (degenerative disc disease), lumbar   Relevant Medications   naproxen (NAPROSYN) 500 MG tablet   gabapentin (NEURONTIN) 300 MG capsule   allopurinol (ZYLOPRIM) 300 MG tablet     Other   High cholesterol    Under good control on current regimen. Continue current regimen. Continue to monitor. Call with any concerns. Refills given. Labs drawn today.       Relevant Medications   losartan (COZAAR) 100 MG tablet   hydrochlorothiazide (HYDRODIURIL) 12.5 MG tablet   ezetimibe (ZETIA) 10 MG tablet   atorvastatin (LIPITOR) 40 MG tablet   amLODipine (NORVASC) 10 MG tablet   Other Relevant Orders   CBC with Differential/Platelet   Comprehensive metabolic panel   Lipid Panel w/o Chol/HDL Ratio   Gout    Under good control on current regimen. Continue current regimen. Continue to monitor. Call with any concerns. Refills given. Labs drawn today.       Relevant Medications   naproxen (NAPROSYN) 500 MG tablet   allopurinol (ZYLOPRIM) 300 MG tablet   Other Relevant Orders   CBC with Differential/Platelet   Comprehensive metabolic panel   Uric acid    Other Visit Diagnoses    Routine general medical examination at a health care facility    -  Primary   Vaccines up to date. Screening labs checked today. Colonoscopy up to date. Continue diet and exercise. Call with any concerns.    Relevant Orders   CBC with Differential/Platelet   Comprehensive metabolic panel   HIV Antibody (routine testing w rflx)   Lipid Panel w/o Chol/HDL Ratio   Microalbumin, Urine Waived (Completed)   PSA   TSH   UA/M w/rflx Culture, Routine (Completed)   Uric acid   Screening for prostate cancer       Labs drawn today. Await results.    Relevant Orders   PSA      LABORATORY TESTING:  Health maintenance labs ordered today as discussed above.   The natural  history of prostate cancer and ongoing controversy regarding screening and potential treatment outcomes of prostate cancer has been discussed with the patient. The meaning of a false positive PSA and a false negative PSA has been discussed. He indicates understanding of the limitations of this screening test and wishes to proceed with screening PSA testing.   IMMUNIZATIONS:   - Tdap: Tetanus vaccination status reviewed: last tetanus booster within 10 years. - Influenza: Refused  SCREENING: - Colonoscopy: Up to date  Discussed with patient purpose of the colonoscopy is to detect colon cancer at curable precancerous or early stages   PATIENT COUNSELING:    Sexuality: Discussed sexually transmitted  diseases, partner selection, use of condoms, avoidance of unintended pregnancy  and contraceptive alternatives.   Advised to avoid cigarette smoking.  I discussed with the patient that most people either abstain from alcohol or drink within safe limits (<=14/week and <=4 drinks/occasion for males, <=7/weeks and <= 3 drinks/occasion for females) and that the risk for alcohol disorders and other health effects rises proportionally with the number of drinks per week and how often a drinker exceeds daily limits.  Discussed cessation/primary prevention of drug use and availability of treatment for abuse.   Diet: Encouraged to adjust caloric intake to maintain  or achieve ideal body weight, to reduce intake of dietary saturated fat and total fat, to limit sodium intake by avoiding high sodium foods and not adding table salt, and to maintain adequate dietary potassium and calcium preferably from fresh fruits, vegetables, and low-fat dairy products.    stressed the importance of regular exercise  Injury prevention: Discussed safety belts, safety helmets, smoke detector, smoking near bedding or upholstery.   Dental health: Discussed importance of regular tooth brushing, flossing, and dental visits.    Follow up plan: NEXT PREVENTATIVE PHYSICAL DUE IN 1 YEAR. Return in about 6 months (around 10/25/2019).

## 2019-04-27 NOTE — Assessment & Plan Note (Signed)
Under good control on current regimen. Continue current regimen. Continue to monitor. Call with any concerns. Refills given. Labs drawn today.   

## 2019-04-28 LAB — CBC WITH DIFFERENTIAL/PLATELET
Basophils Absolute: 0 10*3/uL (ref 0.0–0.2)
Basos: 1 %
EOS (ABSOLUTE): 0.2 10*3/uL (ref 0.0–0.4)
Eos: 2 %
Hematocrit: 35.3 % — ABNORMAL LOW (ref 37.5–51.0)
Hemoglobin: 11.7 g/dL — ABNORMAL LOW (ref 13.0–17.7)
Immature Grans (Abs): 0 10*3/uL (ref 0.0–0.1)
Immature Granulocytes: 0 %
Lymphocytes Absolute: 1.3 10*3/uL (ref 0.7–3.1)
Lymphs: 19 %
MCH: 31.4 pg (ref 26.6–33.0)
MCHC: 33.1 g/dL (ref 31.5–35.7)
MCV: 95 fL (ref 79–97)
Monocytes Absolute: 0.5 10*3/uL (ref 0.1–0.9)
Monocytes: 7 %
Neutrophils Absolute: 5 10*3/uL (ref 1.4–7.0)
Neutrophils: 71 %
Platelets: 176 10*3/uL (ref 150–450)
RBC: 3.73 x10E6/uL — ABNORMAL LOW (ref 4.14–5.80)
RDW: 13.7 % (ref 11.6–15.4)
WBC: 7.1 10*3/uL (ref 3.4–10.8)

## 2019-04-28 LAB — COMPREHENSIVE METABOLIC PANEL
ALT: 8 IU/L (ref 0–44)
AST: 14 IU/L (ref 0–40)
Albumin/Globulin Ratio: 2.2 (ref 1.2–2.2)
Albumin: 4.2 g/dL (ref 3.8–4.8)
Alkaline Phosphatase: 53 IU/L (ref 39–117)
BUN/Creatinine Ratio: 15 (ref 10–24)
BUN: 17 mg/dL (ref 8–27)
Bilirubin Total: 0.4 mg/dL (ref 0.0–1.2)
CO2: 25 mmol/L (ref 20–29)
Calcium: 9.1 mg/dL (ref 8.6–10.2)
Chloride: 103 mmol/L (ref 96–106)
Creatinine, Ser: 1.12 mg/dL (ref 0.76–1.27)
GFR calc Af Amer: 82 mL/min/{1.73_m2} (ref >59)
GFR calc non Af Amer: 71 mL/min/{1.73_m2} (ref >59)
Globulin, Total: 1.9 g/dL (ref 1.5–4.5)
Glucose: 99 mg/dL (ref 65–99)
Potassium: 3.7 mmol/L (ref 3.5–5.2)
Sodium: 140 mmol/L (ref 134–144)
Total Protein: 6.1 g/dL (ref 6.0–8.5)

## 2019-04-28 LAB — LIPID PANEL W/O CHOL/HDL RATIO
Cholesterol, Total: 178 mg/dL (ref 100–199)
HDL: 61 mg/dL (ref >39)
LDL Chol Calc (NIH): 95 mg/dL (ref 0–99)
Triglycerides: 127 mg/dL (ref 0–149)
VLDL Cholesterol Cal: 22 mg/dL (ref 5–40)

## 2019-04-28 LAB — HIV ANTIBODY (ROUTINE TESTING W REFLEX): HIV Screen 4th Generation wRfx: NONREACTIVE

## 2019-04-28 LAB — URIC ACID: Uric Acid: 4.6 mg/dL (ref 3.8–8.4)

## 2019-04-28 LAB — PSA: Prostate Specific Ag, Serum: 0.3 ng/mL (ref 0.0–4.0)

## 2019-04-28 LAB — TSH: TSH: 0.941 u[IU]/mL (ref 0.450–4.500)

## 2019-04-28 NOTE — Addendum Note (Signed)
Addended by: Valerie Roys on: 04/28/2019 12:55 PM   Modules accepted: Orders

## 2019-05-06 DIAGNOSIS — G4733 Obstructive sleep apnea (adult) (pediatric): Secondary | ICD-10-CM | POA: Diagnosis not present

## 2019-05-06 DIAGNOSIS — I10 Essential (primary) hypertension: Secondary | ICD-10-CM | POA: Diagnosis not present

## 2019-05-06 DIAGNOSIS — E782 Mixed hyperlipidemia: Secondary | ICD-10-CM | POA: Diagnosis not present

## 2019-05-06 DIAGNOSIS — R001 Bradycardia, unspecified: Secondary | ICD-10-CM | POA: Diagnosis not present

## 2019-05-11 DIAGNOSIS — M5136 Other intervertebral disc degeneration, lumbar region: Secondary | ICD-10-CM | POA: Diagnosis not present

## 2019-05-11 DIAGNOSIS — M5416 Radiculopathy, lumbar region: Secondary | ICD-10-CM | POA: Diagnosis not present

## 2019-05-20 ENCOUNTER — Encounter: Payer: Self-pay | Admitting: Nurse Practitioner

## 2019-05-20 ENCOUNTER — Ambulatory Visit (INDEPENDENT_AMBULATORY_CARE_PROVIDER_SITE_OTHER): Payer: BC Managed Care – PPO | Admitting: Nurse Practitioner

## 2019-05-20 ENCOUNTER — Other Ambulatory Visit: Payer: Self-pay

## 2019-05-20 DIAGNOSIS — A09 Infectious gastroenteritis and colitis, unspecified: Secondary | ICD-10-CM | POA: Diagnosis not present

## 2019-05-20 DIAGNOSIS — R197 Diarrhea, unspecified: Secondary | ICD-10-CM | POA: Insufficient documentation

## 2019-05-20 DIAGNOSIS — Z20828 Contact with and (suspected) exposure to other viral communicable diseases: Secondary | ICD-10-CM | POA: Diagnosis not present

## 2019-05-20 MED ORDER — ONDANSETRON HCL 4 MG PO TABS: 4.0000 mg | ORAL_TABLET | Freq: Three times a day (TID) | ORAL | 0 refills | Status: DC | PRN

## 2019-05-20 NOTE — Assessment & Plan Note (Signed)
Acute x 24 hours.  Due to current Covid pandemic, have recommended he obtain Covid testing and discussed ways he could obtain this, plans on [facility] testing.  He is to contact provider with results and self quarantine until symptoms improved and results returned. If positive will need to continue quarantine x 10 days.  Script for Zofran sent in for nausea.  Recommend BLAND diet at home and plenty of fluids and rest during acute phase.  Will provide work note if required.  Return to office for worsening or ongoing symptoms.

## 2019-05-20 NOTE — Patient Instructions (Signed)

## 2019-05-20 NOTE — Progress Notes (Signed)
There were no vitals taken for this visit.   Subjective:    Patient ID: Daniel Cook, male    DOB: [birth year], 64 y.o.   MRN: [id number]  HPI: Daniel Cook is a 64 y.o. male  Chief Complaint  Patient presents with  . Diarrhea    Symptoms began yesterday.   . Abdominal Pain  . Abdominal Cramping    . This visit was completed via telephone due to the restrictions of the COVID-19 pandemic. All issues as above were discussed and addressed but no physical exam was performed. If it was felt that the patient should be evaluated in the office, they were directed there. The patient verbally consented to this visit. Patient was unable to complete an audio/visual visit due to Lack of equipment. Due to the catastrophic nature of the COVID-19 pandemic, this visit was done through audio contact only. . Location of the patient: home . Location of the provider: work . Those involved with this call:  . Provider: Marnee Guarneri, DNP . CMA: Yvonna Alanis, CMA . Front Desk/Registration: Don Perking  . Time spent on call: 15 minutes on the phone discussing health concerns. 10 minutes total spent in review of patient's record and preparation of their chart.  . I verified patient identity using two factors (patient name and date of birth). Patient consents verbally to being seen via telemedicine visit today.    ABDOMINAL PAIN  Started yesterday morning and lasted until about 4 pm, had diarrhea and abdominal pain.  On Tuesday at lunch had grilled onions and grilled mushrooms, he feels this is cause of current acute illness.  On way to work today, started to have a wave of diarrhea again.  No loss of taste or smell.  Denies cough, SOB, nasal congestion, or fever.  Does have some fatigue.  Does not recall being around anyone with Covid.  Works for [employer] Duration:days Onset: gradual Severity: 6/10 Quality: sharp, aching and cramping Location:  diffuse  Episode  duration:  Radiation: no Frequency: constant, comes and goes in severity waves Alleviating factors:  Aggravating factors: Status: fluctuating Treatments attempted: none Fever: no Nausea: yes Vomiting: no Weight loss: no Decreased appetite: yes Diarrhea: yes Constipation: no Blood in stool: no, not in stool, did notice pink when wiped Heartburn: no Jaundice: no Rash: no  Relevant past medical, surgical, family and social history reviewed and updated as indicated. Interim medical history since our last visit reviewed. Allergies and medications reviewed and updated.  Review of Systems  Constitutional: Positive for appetite change and fatigue. Negative for activity change, diaphoresis and fever.  HENT: Negative.   Respiratory: Negative for cough, chest tightness, shortness of breath and wheezing.   Cardiovascular: Negative for chest pain, palpitations and leg swelling.  Gastrointestinal: Positive for abdominal pain, diarrhea and nausea. Negative for abdominal distention, blood in stool, constipation, rectal pain and vomiting.  Psychiatric/Behavioral: Negative.     Per HPI unless specifically indicated above     Objective:    There were no vitals taken for this visit.  Wt Readings from Last 3 Encounters:  04/27/19 214 lb (97.1 kg)  09/15/18 217 lb (98.4 kg)  06/04/18 225 lb (102.1 kg)    Physical Exam   Unable to perform due to telephone visit only.  Results for orders placed or performed in visit on 04/27/19  CBC with Differential/Platelet  Result Value Ref Range   WBC 7.1 3.4 - 10.8 x10E3/uL   RBC 3.73 (L) 4.14 -  5.80 x10E6/uL   Hemoglobin 11.7 (L) 13.0 - 17.7 g/dL   Hematocrit 35.3 (L) 37.5 - 51.0 %   MCV 95 79 - 97 fL   MCH 31.4 26.6 - 33.0 pg   MCHC 33.1 31.5 - 35.7 g/dL   RDW 13.7 11.6 - 15.4 %   Platelets 176 150 - 450 x10E3/uL   Neutrophils 71 Not Estab. %   Lymphs 19 Not Estab. %   Monocytes 7 Not Estab. %   Eos 2 Not Estab. %   Basos 1 Not Estab. %     Neutrophils Absolute 5.0 1.4 - 7.0 x10E3/uL   Lymphocytes Absolute 1.3 0.7 - 3.1 x10E3/uL   Monocytes Absolute 0.5 0.1 - 0.9 x10E3/uL   EOS (ABSOLUTE) 0.2 0.0 - 0.4 x10E3/uL   Basophils Absolute 0.0 0.0 - 0.2 x10E3/uL   Immature Granulocytes 0 Not Estab. %   Immature Grans (Abs) 0.0 0.0 - 0.1 x10E3/uL  Comprehensive metabolic panel  Result Value Ref Range   Glucose 99 65 - 99 mg/dL   BUN 17 8 - 27 mg/dL   Creatinine, Ser 1.12 0.76 - 1.27 mg/dL   GFR calc non Af Amer 71 >59 mL/min/1.73   GFR calc Af Amer 82 >59 mL/min/1.73   BUN/Creatinine Ratio 15 10 - 24   Sodium 140 134 - 144 mmol/L   Potassium 3.7 3.5 - 5.2 mmol/L   Chloride 103 96 - 106 mmol/L   CO2 25 20 - 29 mmol/L   Calcium 9.1 8.6 - 10.2 mg/dL   Total Protein 6.1 6.0 - 8.5 g/dL   Albumin 4.2 3.8 - 4.8 g/dL   Globulin, Total 1.9 1.5 - 4.5 g/dL   Albumin/Globulin Ratio 2.2 1.2 - 2.2   Bilirubin Total 0.4 0.0 - 1.2 mg/dL   Alkaline Phosphatase 53 39 - 117 IU/L   AST 14 0 - 40 IU/L   ALT 8 0 - 44 IU/L  HIV Antibody (routine testing w rflx)  Result Value Ref Range   HIV Screen 4th Generation wRfx Non Reactive Non Reactive  Lipid Panel w/o Chol/HDL Ratio  Result Value Ref Range   Cholesterol, Total 178 100 - 199 mg/dL   Triglycerides 127 0 - 149 mg/dL   HDL 61 >39 mg/dL   VLDL Cholesterol Cal 22 5 - 40 mg/dL   LDL Chol Calc (NIH) 95 0 - 99 mg/dL  Microalbumin, Urine Waived  Result Value Ref Range   Microalb, Ur Waived 10 0 - 19 mg/L   Creatinine, Urine Waived 50 10 - 300 mg/dL   Microalb/Creat Ratio <30 <30 mg/g  PSA  Result Value Ref Range   Prostate Specific Ag, Serum 0.3 0.0 - 4.0 ng/mL  TSH  Result Value Ref Range   TSH 0.941 0.450 - 4.500 uIU/mL  UA/M w/rflx Culture, Routine   Specimen: Urine   URINE  Result Value Ref Range   Specific Gravity, UA <1.005 (L) 1.005 - 1.030   pH, UA 6.5 5.0 - 7.5   Color, UA Yellow Yellow   Appearance Ur Clear Clear   Leukocytes,UA Negative Negative   Protein,UA  Negative Negative/Trace   Glucose, UA Negative Negative   Ketones, UA Negative Negative   RBC, UA Negative Negative   Bilirubin, UA Negative Negative   Urobilinogen, Ur 0.2 0.2 - 1.0 mg/dL   Nitrite, UA Negative Negative  Uric acid  Result Value Ref Range   Uric Acid 4.6 3.8 - 8.4 mg/dL      Assessment & Plan:  Problem List Items Addressed This Visit      Other   Diarrhea    Acute x 24 hours.  Due to current Covid pandemic, have recommended he obtain Covid testing and discussed ways he could obtain this, plans on [facility] testing.  He is to contact provider with results and self quarantine until symptoms improved and results returned. If positive will need to continue quarantine x 10 days.  Script for Zofran sent in for nausea.  Recommend BLAND diet at home and plenty of fluids and rest during acute phase.  Will provide work note if required.  Return to office for worsening or ongoing symptoms.         I discussed the assessment and treatment plan with the patient. The patient was provided an opportunity to ask questions and all were answered. The patient agreed with the plan and demonstrated an understanding of the instructions.   The patient was advised to call back or seek an in-person evaluation if the symptoms worsen or if the condition fails to improve as anticipated.   I provided 15  minutes of time during this encounter.  Follow up plan: Return if symptoms worsen or fail to improve.

## 2019-05-28 ENCOUNTER — Other Ambulatory Visit: Payer: Self-pay | Admitting: Family Medicine

## 2019-05-28 NOTE — Telephone Encounter (Signed)
Requested Prescriptions  Pending Prescriptions Disp Refills  . loratadine (CLARITIN) 10 MG tablet [Pharmacy Med Name: LORATADINE 10 MG TABLET] 30 tablet 1    Sig: TAKE 1 TABLET BY MOUTH EVERY DAY     Ear, Nose, and Throat:  Antihistamines Passed - 05/28/2019  2:31 PM      Passed - Valid encounter within last 12 months    Recent Outpatient Visits          1 week ago Diarrhea of infectious origin   [place], Barbaraann Faster, NP   1 month ago Routine general medical examination at a health care facility   [facility], [place] P, DO   2 months ago Pain, dental   Major, Kimball, DO   2 months ago Idiopathic gout, unspecified chronicity, unspecified site   [facility], [place], DO   8 months ago Essential hypertension   [place], MD      Future Appointments            In 5 months Johnson, Barb Merino, DO [facility]

## 2019-07-15 DIAGNOSIS — M5136 Other intervertebral disc degeneration, lumbar region: Secondary | ICD-10-CM | POA: Diagnosis not present

## 2019-07-15 DIAGNOSIS — M5416 Radiculopathy, lumbar region: Secondary | ICD-10-CM | POA: Diagnosis not present

## 2019-07-27 ENCOUNTER — Ambulatory Visit (INDEPENDENT_AMBULATORY_CARE_PROVIDER_SITE_OTHER): Payer: BC Managed Care – PPO | Admitting: Family Medicine

## 2019-07-27 ENCOUNTER — Other Ambulatory Visit: Payer: Self-pay

## 2019-07-27 ENCOUNTER — Encounter: Payer: Self-pay | Admitting: Family Medicine

## 2019-07-27 VITALS — BP 136/72 | HR 56 | Temp 96.7°F | Wt 210.1 lb

## 2019-07-27 DIAGNOSIS — I10 Essential (primary) hypertension: Secondary | ICD-10-CM

## 2019-07-27 DIAGNOSIS — D649 Anemia, unspecified: Secondary | ICD-10-CM | POA: Diagnosis not present

## 2019-07-27 DIAGNOSIS — R001 Bradycardia, unspecified: Secondary | ICD-10-CM | POA: Diagnosis not present

## 2019-07-27 NOTE — Progress Notes (Signed)
BP 136/72 (BP Location: Left Arm, Patient Position: Sitting, Cuff Size: Normal)   Pulse (!) 56   Temp (!) 96.7 F (35.9 C) (Oral)   Wt 210 lb 2 oz (95.3 kg)   SpO2 96%   BMI 32.91 kg/m    Subjective:    Patient ID: Daniel Cook, male    DOB: 11-18-1955, 64 y.o.   MRN: [id number]  HPI: Daniel Cook is a 63 y.o. male  Chief Complaint  Patient presents with  . Hypertension   HYPERTENSION- was taken off his amlodipine at his cardiologist's office. He started feeling really weird about 4 days ago, took his blood pressure and it was in the 190s. He restarted his amlodipine at that time and it's been slowly coming down since then Hypertension status: exacerbated  Satisfied with current treatment? no Duration of hypertension: chronic BP monitoring frequency:  a few times a day BP range: 190s at home on home BP cuff BP medication side effects:  no Medication compliance: excellent compliance Previous BP meds: was on amlodipine, cardiologist took him off it, but he just restarted it on Friday Aspirin: yes Recurrent headaches: yes Visual changes: yes Palpitations: no Dyspnea: no Chest pain: no Lower extremity edema: no Dizzy/lightheaded: yes  Relevant past medical, surgical, family and social history reviewed and updated as indicated. Interim medical history since our last visit reviewed. Allergies and medications reviewed and updated.  Review of Systems  Constitutional: Negative.   Respiratory: Negative.   Cardiovascular: Negative.   Gastrointestinal: Negative.   Musculoskeletal: Negative.   Neurological: Positive for dizziness and headaches. Negative for tremors, seizures, syncope, facial asymmetry, speech difficulty, weakness, light-headedness and numbness.  Psychiatric/Behavioral: Negative.     Per HPI unless specifically indicated above     Objective:    BP 136/72 (BP Location: Left Arm, Patient Position: Sitting, Cuff Size: Normal)   Pulse (!) 56    Temp (!) 96.7 F (35.9 C) (Oral)   Wt 210 lb 2 oz (95.3 kg)   SpO2 96%   BMI 32.91 kg/m   Wt Readings from Last 3 Encounters:  07/27/19 210 lb 2 oz (95.3 kg)  04/27/19 214 lb (97.1 kg)  09/15/18 217 lb (98.4 kg)    Physical Exam Vitals and nursing note reviewed.  Constitutional:      General: He is not in acute distress.    Appearance: Normal appearance. He is not ill-appearing, toxic-appearing or diaphoretic.  HENT:     Head: Normocephalic and atraumatic.     Right Ear: External ear normal.     Left Ear: External ear normal.     Nose: Nose normal.     Mouth/Throat:     Mouth: Mucous membranes are moist.     Pharynx: Oropharynx is clear.  Eyes:     General: No scleral icterus.       Right eye: No discharge.        Left eye: No discharge.     Extraocular Movements: Extraocular movements intact.     Conjunctiva/sclera: Conjunctivae normal.     Pupils: Pupils are equal, round, and reactive to light.  Cardiovascular:     Rate and Rhythm: Normal rate and regular rhythm.     Pulses: Normal pulses.     Heart sounds: Normal heart sounds. No murmur. No friction rub. No gallop.   Pulmonary:     Effort: Pulmonary effort is normal. No respiratory distress.     Breath sounds: Normal breath sounds. No stridor. No wheezing,  rhonchi or rales.  Chest:     Chest wall: No tenderness.  Musculoskeletal:        General: Normal range of motion.     Cervical back: Normal range of motion and neck supple.  Skin:    General: Skin is warm and dry.     Capillary Refill: Capillary refill takes less than 2 seconds.     Coloration: Skin is not jaundiced or pale.     Findings: No bruising, erythema, lesion or rash.  Neurological:     General: No focal deficit present.     Mental Status: He is alert and oriented to person, place, and time. Mental status is at baseline.  Psychiatric:        Mood and Affect: Mood normal.        Behavior: Behavior normal.        Thought Content: Thought content  normal.        Judgment: Judgment normal.     Results for orders placed or performed in visit on 07/27/19  CBC with Differential/Platelet  Result Value Ref Range   WBC 9.2 3.4 - 10.8 x10E3/uL   RBC 4.00 (L) 4.14 - 5.80 x10E6/uL   Hemoglobin 12.7 (L) 13.0 - 17.7 g/dL   Hematocrit 37.9 37.5 - 51.0 %   MCV 95 79 - 97 fL   MCH 31.8 26.6 - 33.0 pg   MCHC 33.5 31.5 - 35.7 g/dL   RDW 14.2 11.6 - 15.4 %   Platelets 208 150 - 450 x10E3/uL   Neutrophils 66 Not Estab. %   Lymphs 23 Not Estab. %   Monocytes 8 Not Estab. %   Eos 2 Not Estab. %   Basos 1 Not Estab. %   Neutrophils Absolute 6.1 1.4 - 7.0 x10E3/uL   Lymphocytes Absolute 2.1 0.7 - 3.1 x10E3/uL   Monocytes Absolute 0.7 0.1 - 0.9 x10E3/uL   EOS (ABSOLUTE) 0.2 0.0 - 0.4 x10E3/uL   Basophils Absolute 0.1 0.0 - 0.2 x10E3/uL   Immature Granulocytes 0 Not Estab. %   Immature Grans (Abs) 0.0 0.0 - 0.1 [id number]  Basic metabolic panel  Result Value Ref Range   Glucose 112 (H) 65 - 99 mg/dL   BUN 18 8 - 27 mg/dL   Creatinine, Ser 1.02 0.76 - 1.27 mg/dL   GFR calc non Af Amer 78 >59 mL/min/1.73   GFR calc Af Amer 91 >59 mL/min/1.73   BUN/Creatinine Ratio 18 10 - 24   Sodium 141 134 - 144 mmol/L   Potassium 3.8 3.5 - 5.2 mmol/L   Chloride 103 96 - 106 mmol/L   CO2 25 20 - 29 mmol/L   Calcium 9.4 8.6 - 10.2 mg/dL      Assessment & Plan:   Problem List Items Addressed This Visit      Cardiovascular and Mediastinum   Hypertension - Primary    BP doing well now. Will continue 5mg  of amlodipine and recheck 2 weeks. Call with any concerns. Continue to monitor.       Relevant Orders   Ambulatory referral to Cardiology   Basic metabolic panel (Completed)     Other   Bradycardia    Would like a 2nd opinion with cardiology. Referral generated today.       Relevant Orders   Ambulatory referral to Cardiology    Other Visit Diagnoses    Anemia, unspecified type       Will recheck labs today. Await results. Treat as needed.  Follow up plan: Return in about 2 weeks (around 08/10/2019).

## 2019-07-27 NOTE — Patient Instructions (Addendum)
[facility]: Esmond Plants, M.D., Ph.D. Address: [id number], [place], [place], [place] [address]  Hours:  Closes soon ? 5PM ? Opens 7:30AM Wed Phone: [phone]

## 2019-07-28 ENCOUNTER — Encounter: Payer: Self-pay | Admitting: Family Medicine

## 2019-07-28 LAB — BASIC METABOLIC PANEL
BUN/Creatinine Ratio: 18 (ref 10–24)
BUN: 18 mg/dL (ref 8–27)
CO2: 25 mmol/L (ref 20–29)
Calcium: 9.4 mg/dL (ref 8.6–10.2)
Chloride: 103 mmol/L (ref 96–106)
Creatinine, Ser: 1.02 mg/dL (ref 0.76–1.27)
GFR calc Af Amer: 91 mL/min/{1.73_m2} (ref >59)
GFR calc non Af Amer: 78 mL/min/{1.73_m2} (ref >59)
Glucose: 112 mg/dL — ABNORMAL HIGH (ref 65–99)
Potassium: 3.8 mmol/L (ref 3.5–5.2)
Sodium: 141 mmol/L (ref 134–144)

## 2019-07-28 LAB — CBC WITH DIFFERENTIAL/PLATELET
Basophils Absolute: 0.1 10*3/uL (ref 0.0–0.2)
Basos: 1 %
EOS (ABSOLUTE): 0.2 10*3/uL (ref 0.0–0.4)
Eos: 2 %
Hematocrit: 37.9 % (ref 37.5–51.0)
Hemoglobin: 12.7 g/dL — ABNORMAL LOW (ref 13.0–17.7)
Immature Grans (Abs): 0 10*3/uL (ref 0.0–0.1)
Immature Granulocytes: 0 %
Lymphocytes Absolute: 2.1 10*3/uL (ref 0.7–3.1)
Lymphs: 23 %
MCH: 31.8 pg (ref 26.6–33.0)
MCHC: 33.5 g/dL (ref 31.5–35.7)
MCV: 95 fL (ref 79–97)
Monocytes Absolute: 0.7 10*3/uL (ref 0.1–0.9)
Monocytes: 8 %
Neutrophils Absolute: 6.1 10*3/uL (ref 1.4–7.0)
Neutrophils: 66 %
Platelets: 208 10*3/uL (ref 150–450)
RBC: 4 x10E6/uL — ABNORMAL LOW (ref 4.14–5.80)
RDW: 14.2 % (ref 11.6–15.4)
WBC: 9.2 10*3/uL (ref 3.4–10.8)

## 2019-07-28 NOTE — Assessment & Plan Note (Signed)
Would like a 2nd opinion with cardiology. Referral generated today.

## 2019-07-28 NOTE — Assessment & Plan Note (Signed)
BP doing well now. Will continue 5mg  of amlodipine and recheck 2 weeks. Call with any concerns. Continue to monitor.

## 2019-07-30 ENCOUNTER — Other Ambulatory Visit: Payer: Self-pay

## 2019-07-30 ENCOUNTER — Ambulatory Visit: Payer: BC Managed Care – PPO

## 2019-07-30 VITALS — BP 129/68 | HR 58

## 2019-07-30 DIAGNOSIS — Z0130 Encounter for examination of blood pressure without abnormal findings: Secondary | ICD-10-CM

## 2019-07-30 NOTE — Progress Notes (Signed)
Patient came in for BP comparison between our machine and his monitor from home.  Our machine: 132/70 and 78 pulse His machine: 129/68 and 58 pulse

## 2019-08-06 ENCOUNTER — Other Ambulatory Visit: Payer: Self-pay | Admitting: Family Medicine

## 2019-08-06 DIAGNOSIS — E7800 Pure hypercholesterolemia, unspecified: Secondary | ICD-10-CM

## 2019-08-06 MED ORDER — ATORVASTATIN CALCIUM 40 MG PO TABS: 40.0000 mg | ORAL_TABLET | Freq: Every day | ORAL | 1 refills | Status: DC

## 2019-08-06 NOTE — Telephone Encounter (Signed)
Patient requesting 90 day supply of atorvastatin (LIPITOR) 40 MG tablet, please allow 48 to 72 hour turn around time.    [place], [place] Phone:  [phone]  Fax:  [phone]

## 2019-08-10 ENCOUNTER — Other Ambulatory Visit: Payer: Self-pay

## 2019-08-10 ENCOUNTER — Ambulatory Visit: Payer: BC Managed Care – PPO | Admitting: Family Medicine

## 2019-08-10 ENCOUNTER — Encounter: Payer: Self-pay | Admitting: Family Medicine

## 2019-08-10 VITALS — BP 126/79 | HR 54 | Temp 98.1°F | Wt 212.8 lb

## 2019-08-10 DIAGNOSIS — E7800 Pure hypercholesterolemia, unspecified: Secondary | ICD-10-CM

## 2019-08-10 DIAGNOSIS — M51369 Other intervertebral disc degeneration, lumbar region without mention of lumbar back pain or lower extremity pain: Secondary | ICD-10-CM

## 2019-08-10 DIAGNOSIS — M1000 Idiopathic gout, unspecified site: Secondary | ICD-10-CM | POA: Diagnosis not present

## 2019-08-10 DIAGNOSIS — M5136 Other intervertebral disc degeneration, lumbar region: Secondary | ICD-10-CM

## 2019-08-10 DIAGNOSIS — I10 Essential (primary) hypertension: Secondary | ICD-10-CM | POA: Diagnosis not present

## 2019-08-10 MED ORDER — ALBUTEROL SULFATE HFA 108 (90 BASE) MCG/ACT IN AERS: INHALATION_SPRAY | RESPIRATORY_TRACT | 6 refills | Status: DC

## 2019-08-10 MED ORDER — GABAPENTIN 300 MG PO CAPS: ORAL_CAPSULE | ORAL | 0 refills | Status: DC

## 2019-08-10 MED ORDER — OMEPRAZOLE 20 MG PO CPDR: 20.0000 mg | DELAYED_RELEASE_CAPSULE | Freq: Every day | ORAL | 0 refills | Status: DC

## 2019-08-10 MED ORDER — ALLOPURINOL 300 MG PO TABS: 300.0000 mg | ORAL_TABLET | Freq: Every day | ORAL | 0 refills | Status: DC

## 2019-08-10 MED ORDER — EZETIMIBE 10 MG PO TABS: 10.0000 mg | ORAL_TABLET | Freq: Every day | ORAL | 0 refills | Status: DC

## 2019-08-10 MED ORDER — LOSARTAN POTASSIUM 100 MG PO TABS: 100.0000 mg | ORAL_TABLET | Freq: Every day | ORAL | 0 refills | Status: DC

## 2019-08-10 MED ORDER — COLCHICINE 0.6 MG PO TABS: 0.6000 mg | ORAL_TABLET | Freq: Every day | ORAL | 0 refills | Status: DC | PRN

## 2019-08-10 MED ORDER — NAPROXEN 500 MG PO TABS: ORAL_TABLET | ORAL | 0 refills | Status: DC

## 2019-08-10 MED ORDER — LORATADINE 10 MG PO TABS: 10.0000 mg | ORAL_TABLET | Freq: Every day | ORAL | 0 refills | Status: DC

## 2019-08-10 MED ORDER — HYDROCHLOROTHIAZIDE 12.5 MG PO TABS: 12.5000 mg | ORAL_TABLET | Freq: Every day | ORAL | 0 refills | Status: DC

## 2019-08-10 MED ORDER — AMLODIPINE BESYLATE 10 MG PO TABS: 5.0000 mg | ORAL_TABLET | Freq: Every day | ORAL | 1 refills | Status: DC

## 2019-08-10 MED ORDER — AMLODIPINE BESYLATE 5 MG PO TABS: 5.0000 mg | ORAL_TABLET | Freq: Every day | ORAL | 0 refills | Status: DC

## 2019-08-10 MED ORDER — ATORVASTATIN CALCIUM 40 MG PO TABS: 40.0000 mg | ORAL_TABLET | Freq: Every day | ORAL | 0 refills | Status: DC

## 2019-08-10 NOTE — Progress Notes (Signed)
BP 126/79 (BP Location: Left Arm, Patient Position: Sitting, Cuff Size: Normal)   Pulse (!) 54   Temp 98.1 F (36.7 C) (Oral)   Wt 212 lb 12.8 oz (96.5 kg)   SpO2 96%   BMI 33.33 kg/m    Subjective:    Patient ID: Daniel Cook, male    DOB: 1955-04-22, 64 y.o.   MRN: [id number]  HPI: Daniel Cook is a 64 y.o. male  Chief Complaint  Patient presents with  . Hypertension   HYPERTENSION Hypertension status: controlled  Satisfied with current treatment? yes Duration of hypertension: chronic BP monitoring frequency:  not checking BP medication side effects:  no Medication compliance: excellent compliance Previous BP meds: Aspirin: yes Recurrent headaches: no Visual changes: no Palpitations: no Dyspnea: no Chest pain: no Lower extremity edema: no Dizzy/lightheaded: no  Relevant past medical, surgical, family and social history reviewed and updated as indicated. Interim medical history since our last visit reviewed. Allergies and medications reviewed and updated.  Review of Systems  Per HPI unless specifically indicated above     Objective:    BP 126/79 (BP Location: Left Arm, Patient Position: Sitting, Cuff Size: Normal)   Pulse (!) 54   Temp 98.1 F (36.7 C) (Oral)   Wt 212 lb 12.8 oz (96.5 kg)   SpO2 96%   BMI 33.33 kg/m   Wt Readings from Last 3 Encounters:  08/10/19 212 lb 12.8 oz (96.5 kg)  07/27/19 210 lb 2 oz (95.3 kg)  04/27/19 214 lb (97.1 kg)    Physical Exam Vitals and nursing note reviewed.  Constitutional:      General: He is not in acute distress.    Appearance: Normal appearance. He is not ill-appearing, toxic-appearing or diaphoretic.  HENT:     Head: Normocephalic and atraumatic.     Right Ear: External ear normal.     Left Ear: External ear normal.     Nose: Nose normal.     Mouth/Throat:     Mouth: Mucous membranes are moist.     Pharynx: Oropharynx is clear.  Eyes:     General: No scleral icterus.       Right eye:  No discharge.        Left eye: No discharge.     Extraocular Movements: Extraocular movements intact.     Conjunctiva/sclera: Conjunctivae normal.     Pupils: Pupils are equal, round, and reactive to light.  Cardiovascular:     Rate and Rhythm: Normal rate and regular rhythm.     Pulses: Normal pulses.     Heart sounds: Normal heart sounds. No murmur. No friction rub. No gallop.   Pulmonary:     Effort: Pulmonary effort is normal. No respiratory distress.     Breath sounds: Normal breath sounds. No stridor. No wheezing, rhonchi or rales.  Chest:     Chest wall: No tenderness.  Musculoskeletal:        General: Normal range of motion.     Cervical back: Normal range of motion and neck supple.  Skin:    General: Skin is warm and dry.     Capillary Refill: Capillary refill takes less than 2 seconds.     Coloration: Skin is not jaundiced or pale.     Findings: No bruising, erythema, lesion or rash.  Neurological:     General: No focal deficit present.     Mental Status: He is alert and oriented to person, place, and time. Mental status is  at baseline.  Psychiatric:        Mood and Affect: Mood normal.        Behavior: Behavior normal.        Thought Content: Thought content normal.        Judgment: Judgment normal.     Results for orders placed or performed in visit on 07/27/19  CBC with Differential/Platelet  Result Value Ref Range   WBC 9.2 3.4 - 10.8 x10E3/uL   RBC 4.00 (L) 4.14 - 5.80 x10E6/uL   Hemoglobin 12.7 (L) 13.0 - 17.7 g/dL   Hematocrit 37.9 37.5 - 51.0 %   MCV 95 79 - 97 fL   MCH 31.8 26.6 - 33.0 pg   MCHC 33.5 31.5 - 35.7 g/dL   RDW 14.2 11.6 - 15.4 %   Platelets 208 150 - 450 x10E3/uL   Neutrophils 66 Not Estab. %   Lymphs 23 Not Estab. %   Monocytes 8 Not Estab. %   Eos 2 Not Estab. %   Basos 1 Not Estab. %   Neutrophils Absolute 6.1 1.4 - 7.0 x10E3/uL   Lymphocytes Absolute 2.1 0.7 - 3.1 x10E3/uL   Monocytes Absolute 0.7 0.1 - 0.9 x10E3/uL   EOS  (ABSOLUTE) 0.2 0.0 - 0.4 x10E3/uL   Basophils Absolute 0.1 0.0 - 0.2 x10E3/uL   Immature Granulocytes 0 Not Estab. %   Immature Grans (Abs) 0.0 0.0 - 0.1 [id number]  Basic metabolic panel  Result Value Ref Range   Glucose 112 (H) 65 - 99 mg/dL   BUN 18 8 - 27 mg/dL   Creatinine, Ser 1.02 0.76 - 1.27 mg/dL   GFR calc non Af Amer 78 >59 mL/min/1.73   GFR calc Af Amer 91 >59 mL/min/1.73   BUN/Creatinine Ratio 18 10 - 24   Sodium 141 134 - 144 mmol/L   Potassium 3.8 3.5 - 5.2 mmol/L   Chloride 103 96 - 106 mmol/L   CO2 25 20 - 29 mmol/L   Calcium 9.4 8.6 - 10.2 mg/dL      Assessment & Plan:   Problem List Items Addressed This Visit      Cardiovascular and Mediastinum   Hypertension - Primary    Under good control on current regimen. Continue current regimen. Continue to monitor. Call with any concerns. Refills given for the 5mg .         Relevant Medications   amLODipine (NORVASC) 5 MG tablet   losartan (COZAAR) 100 MG tablet   hydrochlorothiazide (HYDRODIURIL) 12.5 MG tablet   ezetimibe (ZETIA) 10 MG tablet   atorvastatin (LIPITOR) 40 MG tablet     Musculoskeletal and Integument   DDD (degenerative disc disease), lumbar   Relevant Medications   allopurinol (ZYLOPRIM) 300 MG tablet   naproxen (NAPROSYN) 500 MG tablet   gabapentin (NEURONTIN) 300 MG capsule   colchicine 0.6 MG tablet     Other   High cholesterol   Relevant Medications   amLODipine (NORVASC) 5 MG tablet   losartan (COZAAR) 100 MG tablet   hydrochlorothiazide (HYDRODIURIL) 12.5 MG tablet   ezetimibe (ZETIA) 10 MG tablet   atorvastatin (LIPITOR) 40 MG tablet   Gout   Relevant Medications   allopurinol (ZYLOPRIM) 300 MG tablet   naproxen (NAPROSYN) 500 MG tablet       Follow up plan: No follow-ups on file.

## 2019-08-10 NOTE — Assessment & Plan Note (Signed)
Under good control on current regimen. Continue current regimen. Continue to monitor. Call with any concerns. Refills given for the 5mg .

## 2019-08-16 ENCOUNTER — Ambulatory Visit (INDEPENDENT_AMBULATORY_CARE_PROVIDER_SITE_OTHER): Payer: BC Managed Care – PPO | Admitting: Cardiovascular Disease

## 2019-08-16 ENCOUNTER — Encounter: Payer: Self-pay | Admitting: Cardiovascular Disease

## 2019-08-16 ENCOUNTER — Other Ambulatory Visit: Payer: Self-pay

## 2019-08-16 VITALS — BP 120/60 | HR 46 | Ht 68.0 in | Wt 212.4 lb

## 2019-08-16 DIAGNOSIS — Z9989 Dependence on other enabling machines and devices: Secondary | ICD-10-CM

## 2019-08-16 DIAGNOSIS — G4733 Obstructive sleep apnea (adult) (pediatric): Secondary | ICD-10-CM

## 2019-08-16 DIAGNOSIS — I10 Essential (primary) hypertension: Secondary | ICD-10-CM

## 2019-08-16 DIAGNOSIS — R001 Bradycardia, unspecified: Secondary | ICD-10-CM | POA: Diagnosis not present

## 2019-08-16 DIAGNOSIS — E7800 Pure hypercholesterolemia, unspecified: Secondary | ICD-10-CM | POA: Diagnosis not present

## 2019-08-16 NOTE — Patient Instructions (Addendum)
Medication Instructions:  For low blood pressure, consider holding the HCTZ (or go every other day) Amlodipine can be cut in 1/2 daily (2.5) No changes  If you need a refill on your cardiac medications before your next appointment, please call your pharmacy.   Lab work: No new labs needed  If you have labs (blood work) drawn today and your tests are completely normal, you will receive your results only by: Marland Kitchen MyChart Message (if you have MyChart) OR . A paper copy in the mail If you have any lab test that is abnormal or we need to change your treatment, we will call you to review the results.   Testing/Procedures: We will order CT coronary calcium score $154.42 at our [facility] in [place]  Please call [phone] to schedule [facility] [place], [place] [address]    Follow-Up: At [facility], you and your health needs are our priority.  As part of our continuing mission to provide you with exceptional heart care, we have created designated Provider Care Teams.  These Care Teams include your primary Cardiologist (physician) and Advanced Practice Providers (APPs -  Physician Assistants and Nurse Practitioners) who all work together to provide you with the care you need, when you need it.  . You will need a follow up appointment as needed .  Marland Kitchen Providers on your designated Care Team:   . Murray Hodgkins, NP . Christell Faith, PA-C . Marrianne Mood, PA-C  Any Other Special Instructions Will Be Listed Below (If Applicable).  For educational health videos Log in to : [email or web address] Or : [email or web address], password : [email or web address]

## 2019-08-16 NOTE — Progress Notes (Signed)
Cardiology Office Note  Date:  08/16/2019   ID:  Royce Scheider Ferriday, Alferd Apa [birth year], MRN [id number]  PCP:  Guadalupe Maple, MD   Chief Complaint  Patient presents with  . Other    Ref by Park Liter, DO to establish care; former Dr. Nehemiah Massed patient and would like a second opinion regarding a possible pacemaker implant. Pt. c/o fatigue, chest discomfort and shortness of breath.     HPI:  Mr. Lundon Sport is a 64 year old gentleman with past medical history of Hypertension Sleep apnea on CPAP Hyperlipidemia Former smoker Referred by Park Liter for bradycardia  Previously seen at [facility]  Last visit with outpatient cardiology January 2021  pulse 49 blood pressure 138/74  Works at [employer] Notes indicating he has been following a weight loss program down 12 pounds through dietary changes No chest pain No exercise intolerance  On HCTZ, losartan, Zetia, Lipitor aspirin  1 1/2 years ago, Dizzy, low BP,  Amlodipine held  One month ago, felt poorly, BP elevated , [id number] systolic Restarted on amlodipine Does not check it at home on a routine basis Since restarting amlodipine it has been slowly trending further down, several last blood pressure measurements in the 120s Denies any lightheadedness  Lab work reviewed with him Total chol 178, LDL 95 Glu 112  "nag " in left chest Wonders if he should do additional work-up for blockages  Echo 10/2017 NORMAL LEFT VENTRICULAR SYSTOLIC FUNCTION  WITH MILD LVH NORMAL RIGHT VENTRICULAR SYSTOLIC FUNCTION TRIVIAL REGURGITATION NOTED (See above) NO VALVULAR STENOSIS TRIVIAL AR, MR, TR, PR EF 60%  EKG personally reviewed by myself on todays visit Shows sinus bradycardia rate 46 bpm no significant ST-T wave changes  PMH:   has a past medical history of Gout, High cholesterol, Hypertension, Obesity, and Sleep apnea.  PSH:    Past Surgical History:  Procedure Laterality Date  . knee surgeries      Current Outpatient  Medications  Medication Sig Dispense Refill  . albuterol (VENTOLIN HFA) 108 (90 Base) MCG/ACT inhaler INHALE 1 TO 2 PUFFS INTO THE LUNGS EVERY 6 HOURS AS NEEDED FOR WHEEZING OR SHORTNESS OF BREATH 18 g 6  . allopurinol (ZYLOPRIM) 300 MG tablet Take 1 tablet (300 mg total) by mouth daily. 90 tablet 0  . amLODipine (NORVASC) 5 MG tablet Take 1 tablet (5 mg total) by mouth daily. 90 tablet 0  . aspirin 325 MG tablet Take 325 mg by mouth daily.    Marland Kitchen atorvastatin (LIPITOR) 40 MG tablet Take 1 tablet (40 mg total) by mouth daily. 90 tablet 0  . colchicine 0.6 MG tablet Take 1 tablet (0.6 mg total) by mouth daily as needed. 90 tablet 0  . ezetimibe (ZETIA) 10 MG tablet Take 1 tablet (10 mg total) by mouth daily. 90 tablet 0  . gabapentin (NEURONTIN) 300 MG capsule TAKE 2 CAPSULES BY MOUTH  EACH MORNING AND 2 CAPSULES EACH EVENING 360 capsule 0  . hydrochlorothiazide (HYDRODIURIL) 12.5 MG tablet Take 1 tablet (12.5 mg total) by mouth daily. 90 tablet 0  . HYDROcodone-acetaminophen (NORCO/VICODIN) 5-325 MG tablet 1 po tid prn 15 tablet 0  . loratadine (CLARITIN) 10 MG tablet Take 1 tablet (10 mg total) by mouth daily. 90 tablet 0  . losartan (COZAAR) 100 MG tablet Take 1 tablet (100 mg total) by mouth daily. 90 tablet 0  . naproxen (NAPROSYN) 500 MG tablet TAKE 1 TABLET BY MOUTH TWO  TIMES DAILY WITH A MEAL 180 tablet 0  .  omeprazole (PRILOSEC) 20 MG capsule Take 1 capsule (20 mg total) by mouth daily. 90 capsule 0  . tiZANidine (ZANAFLEX) 4 MG tablet TAKE 1 TABLET BY MOUTH TWICE A DAY AS NEEDED    . budesonide-formoterol (SYMBICORT) 160-4.5 MCG/ACT inhaler Inhale 2 puffs into the lungs 2 (two) times daily. (Patient not taking: Reported on 07/27/2019) 3 Inhaler 3  . ondansetron (ZOFRAN) 4 MG tablet Take 1 tablet (4 mg total) by mouth every 8 (eight) hours as needed for nausea or vomiting. (Patient not taking: Reported on 08/10/2019) 30 tablet 0   No current facility-administered medications for this visit.      Allergies:   Patient has no known allergies.   Social History:  The patient  reports that he quit smoking about 20 years ago. His smoking use included cigarettes. He has a 30.00 pack-year smoking history. His smokeless tobacco use includes chew. He reports current alcohol use. He reports that he does not use drugs.   Family History:   family history includes Heart attack (age of onset: 67) in his father; Heart disease in his father; Hyperlipidemia in his father; Hypertension in his father.    Review of Systems: Review of Systems  Constitutional: Negative.   HENT: Negative.   Respiratory: Negative.   Cardiovascular: Negative.   Gastrointestinal: Negative.   Musculoskeletal: Negative.   Neurological: Negative.   Psychiatric/Behavioral: Negative.   All other systems reviewed and are negative.   PHYSICAL EXAM: VS:  BP 120/60 (BP Location: Right Arm, Patient Position: Sitting, Cuff Size: Normal)   Pulse (!) 46   Ht 5\' 8"  (1.727 m)   Wt 212 lb 6 oz (96.3 kg)   SpO2 97%   BMI 32.29 kg/m  , BMI Body mass index is 32.29 kg/m. Constitutional:  oriented to person, place, and time. No distress.  HENT:  Head: Grossly normal Eyes:  no discharge. No scleral icterus.  Neck: No JVD, no carotid bruits  Cardiovascular: Regular rate and rhythm, no murmurs appreciated Pulmonary/Chest: Clear to auscultation bilaterally, no wheezes or rails Abdominal: Soft.  no distension.  no tenderness.  Musculoskeletal: Normal range of motion Neurological:  normal muscle tone. Coordination normal. No atrophy Skin: Skin warm and dry Psychiatric: normal affect, pleasant   Recent Labs: 04/27/2019: ALT 8; TSH 0.941 07/27/2019: BUN 18; Creatinine, Ser 1.02; Hemoglobin 12.7; Platelets 208; Potassium 3.8; Sodium 141    Lipid Panel Lab Results  Component Value Date   CHOL 178 04/27/2019   HDL 61 04/27/2019   LDLCALC 95 04/27/2019   TRIG 127 04/27/2019      Wt Readings from Last 3 Encounters:   08/16/19 212 lb 6 oz (96.3 kg)  08/10/19 212 lb 12.8 oz (96.5 kg)  07/27/19 210 lb 2 oz (95.3 kg)       ASSESSMENT AND PLAN:  Problem List Items Addressed This Visit      Cardiology Problems   High cholesterol   Relevant Medications   aspirin 325 MG tablet   Other Relevant Orders   CT CARDIAC SCORING     Other   Bradycardia - Primary   Relevant Orders   EKG 12-Lead   CT CARDIAC SCORING     Preventive care Recommended he only needs aspirin 81 mg daily CT coronary calcium score discussed with him, has been ordered for risk stratification Cholesterol up a little bit, continue current medications  Bradycardia Asymptomatic,  No further testing needed, adequate blood pressure  Essential hypertension Blood pressure well controlled, Recommend for any lightheaded  spells through the summer he hold the HCTZ or take this every other day If symptoms persist could cut the amlodipine in half  Hyperlipidemia Further management based on CT coronary calcium scoring  Obstructive sleep apnea on CPAP Compliant with a CPAP  Disposition:   F/U  12 months   Total encounter time more than 60 minutes  Greater than 50% was spent in counseling and coordination of care with the patient  Patient was seen in consultation for Park Liter will be referred back to her office for ongoing care of the issues detailed above Signed, Esmond Plants, M.D., Ph.D. [place], [place]

## 2019-10-14 DIAGNOSIS — M5136 Other intervertebral disc degeneration, lumbar region: Secondary | ICD-10-CM | POA: Diagnosis not present

## 2019-10-14 DIAGNOSIS — M5416 Radiculopathy, lumbar region: Secondary | ICD-10-CM | POA: Diagnosis not present

## 2019-10-26 ENCOUNTER — Other Ambulatory Visit: Payer: Self-pay

## 2019-10-26 ENCOUNTER — Encounter: Payer: Self-pay | Admitting: Family Medicine

## 2019-10-26 ENCOUNTER — Ambulatory Visit: Payer: BC Managed Care – PPO | Admitting: Family Medicine

## 2019-10-26 VITALS — BP 129/73 | HR 49 | Temp 98.5°F | Wt 212.0 lb

## 2019-10-26 DIAGNOSIS — E7800 Pure hypercholesterolemia, unspecified: Secondary | ICD-10-CM

## 2019-10-26 DIAGNOSIS — M79671 Pain in right foot: Secondary | ICD-10-CM

## 2019-10-26 DIAGNOSIS — M5136 Other intervertebral disc degeneration, lumbar region: Secondary | ICD-10-CM | POA: Diagnosis not present

## 2019-10-26 DIAGNOSIS — I10 Essential (primary) hypertension: Secondary | ICD-10-CM

## 2019-10-26 DIAGNOSIS — M1000 Idiopathic gout, unspecified site: Secondary | ICD-10-CM

## 2019-10-26 MED ORDER — AMLODIPINE BESYLATE 5 MG PO TABS: 5.0000 mg | ORAL_TABLET | Freq: Every day | ORAL | 1 refills | Status: DC

## 2019-10-26 MED ORDER — HYDROCHLOROTHIAZIDE 12.5 MG PO TABS: 12.5000 mg | ORAL_TABLET | Freq: Every day | ORAL | 1 refills | Status: DC

## 2019-10-26 MED ORDER — GABAPENTIN 300 MG PO CAPS: ORAL_CAPSULE | ORAL | 1 refills | Status: DC

## 2019-10-26 MED ORDER — EZETIMIBE 10 MG PO TABS: 10.0000 mg | ORAL_TABLET | Freq: Every day | ORAL | 1 refills | Status: DC

## 2019-10-26 MED ORDER — OMEPRAZOLE 20 MG PO CPDR: 20.0000 mg | DELAYED_RELEASE_CAPSULE | Freq: Every day | ORAL | 1 refills | Status: DC

## 2019-10-26 MED ORDER — ALLOPURINOL 300 MG PO TABS: 300.0000 mg | ORAL_TABLET | Freq: Every day | ORAL | 1 refills | Status: DC

## 2019-10-26 MED ORDER — COLCHICINE 0.6 MG PO TABS: 0.6000 mg | ORAL_TABLET | Freq: Every day | ORAL | 1 refills | Status: DC | PRN

## 2019-10-26 MED ORDER — LOSARTAN POTASSIUM 100 MG PO TABS: 100.0000 mg | ORAL_TABLET | Freq: Every day | ORAL | 1 refills | Status: DC

## 2019-10-26 MED ORDER — ATORVASTATIN CALCIUM 40 MG PO TABS: 40.0000 mg | ORAL_TABLET | Freq: Every day | ORAL | 1 refills | Status: DC

## 2019-10-26 NOTE — Progress Notes (Signed)
BP 129/73 (BP Location: Left Arm, Patient Position: Sitting, Cuff Size: Normal)   Pulse (!) 49   Temp 98.5 F (36.9 C) (Oral)   Wt 212 lb (96.2 kg)   SpO2 95%   BMI 32.23 kg/m    Subjective:    Patient ID: Daniel Cook, male    DOB: [birth year], 64 y.o.   MRN: [id number]  HPI: Daniel Cook is a 64 y.o. male  Chief Complaint  Patient presents with  . Hyperlipidemia  . Hypertension  . Gout  . Foot Pain   HYPERTENSION / HYPERLIPIDEMIA Satisfied with current treatment? yes Duration of hypertension: chronic BP monitoring frequency: a few times a week BP range: 140s/70s BP medication side effects: no Past BP meds: amlodipine, HCTZ, losartan Duration of hyperlipidemia: chronic Cholesterol medication side effects: no Cholesterol supplements: none Past cholesterol medications: atorvastatin, zetia Medication compliance: excellent compliance Aspirin: yes Recent stressors: no Recurrent headaches: no Visual changes: no Palpitations: no Dyspnea: no Chest pain: no Lower extremity edema: no Dizzy/lightheaded: no   Gout- no flares. Tolerating the allopurinol well. No side effects. No concerns.   Relevant past medical, surgical, family and social history reviewed and updated as indicated. Interim medical history since our last visit reviewed. Allergies and medications reviewed and updated.  Review of Systems  Constitutional: Negative.   Respiratory: Negative.   Cardiovascular: Negative.   Gastrointestinal: Negative.   Musculoskeletal: Positive for arthralgias. Negative for back pain, gait problem, joint swelling, myalgias, neck pain and neck stiffness.  Neurological: Negative.   Psychiatric/Behavioral: Negative.     Per HPI unless specifically indicated above     Objective:    BP 129/73 (BP Location: Left Arm, Patient Position: Sitting, Cuff Size: Normal)   Pulse (!) 49   Temp 98.5 F (36.9 C) (Oral)   Wt 212 lb (96.2 kg)   SpO2 95%   BMI 32.23 kg/m    Wt Readings from Last 3 Encounters:  10/26/19 212 lb (96.2 kg)  08/16/19 212 lb 6 oz (96.3 kg)  08/10/19 212 lb 12.8 oz (96.5 kg)    Physical Exam Vitals and nursing note reviewed.  Constitutional:      General: He is not in acute distress.    Appearance: Normal appearance. He is not ill-appearing, toxic-appearing or diaphoretic.  HENT:     Head: Normocephalic and atraumatic.     Right Ear: External ear normal.     Left Ear: External ear normal.     Nose: Nose normal.     Mouth/Throat:     Mouth: Mucous membranes are moist.     Pharynx: Oropharynx is clear.  Eyes:     General: No scleral icterus.       Right eye: No discharge.        Left eye: No discharge.     Extraocular Movements: Extraocular movements intact.     Conjunctiva/sclera: Conjunctivae normal.     Pupils: Pupils are equal, round, and reactive to light.  Cardiovascular:     Rate and Rhythm: Normal rate and regular rhythm.     Pulses: Normal pulses.     Heart sounds: Normal heart sounds. No murmur heard.  No friction rub. No gallop.   Pulmonary:     Effort: Pulmonary effort is normal. No respiratory distress.     Breath sounds: Normal breath sounds. No stridor. No wheezing, rhonchi or rales.  Chest:     Chest wall: No tenderness.  Musculoskeletal:        General:  Normal range of motion.     Cervical back: Normal range of motion and neck supple.  Skin:    General: Skin is warm and dry.     Capillary Refill: Capillary refill takes less than 2 seconds.     Coloration: Skin is not jaundiced or pale.     Findings: No bruising, erythema, lesion or rash.  Neurological:     General: No focal deficit present.     Mental Status: He is alert and oriented to person, place, and time. Mental status is at baseline.  Psychiatric:        Mood and Affect: Mood normal.        Behavior: Behavior normal.        Thought Content: Thought content normal.        Judgment: Judgment normal.     Results for orders placed or  performed in visit on 10/26/19  Comprehensive metabolic panel  Result Value Ref Range   Glucose 99 65 - 99 mg/dL   BUN 16 8 - 27 mg/dL   Creatinine, Ser 1.03 0.76 - 1.27 mg/dL   GFR calc non Af Amer 77 >59 mL/min/1.73   GFR calc Af Amer 90 >59 mL/min/1.73   BUN/Creatinine Ratio 16 10 - 24   Sodium 141 134 - 144 mmol/L   Potassium 4.1 3.5 - 5.2 mmol/L   Chloride 103 96 - 106 mmol/L   CO2 23 20 - 29 mmol/L   Calcium 9.0 8.6 - 10.2 mg/dL   Total Protein 6.3 6.0 - 8.5 g/dL   Albumin 4.1 3.8 - 4.8 g/dL   Globulin, Total 2.2 1.5 - 4.5 g/dL   Albumin/Globulin Ratio 1.9 1.2 - 2.2   Bilirubin Total <0.2 0.0 - 1.2 mg/dL   Alkaline Phosphatase 59 48 - 121 IU/L   AST 15 0 - 40 IU/L   ALT 13 0 - 44 IU/L  Uric acid  Result Value Ref Range   Uric Acid 3.7 (L) 3.8 - 8.4 mg/dL      Assessment & Plan:   Problem List Items Addressed This Visit      Cardiovascular and Mediastinum   Hypertension - Primary    Under good control on current regimen. Continue current regimen. Continue to monitor. Call with any concerns. Refills given. Labs drawn today.       Relevant Medications   losartan (COZAAR) 100 MG tablet   hydrochlorothiazide (HYDRODIURIL) 12.5 MG tablet   ezetimibe (ZETIA) 10 MG tablet   atorvastatin (LIPITOR) 40 MG tablet   amLODipine (NORVASC) 5 MG tablet   Other Relevant Orders   Comprehensive metabolic panel (Completed)     Musculoskeletal and Integument   DDD (degenerative disc disease), lumbar    Stable. Continue to monitor. Follow with specialist. Call with any concerns.       Relevant Medications   gabapentin (NEURONTIN) 300 MG capsule   colchicine 0.6 MG tablet   allopurinol (ZYLOPRIM) 300 MG tablet     Other   High cholesterol    Under good control on current regimen. Continue current regimen. Continue to monitor. Call with any concerns. Refills given. Labs drawn today.       Relevant Medications   losartan (COZAAR) 100 MG tablet   hydrochlorothiazide  (HYDRODIURIL) 12.5 MG tablet   ezetimibe (ZETIA) 10 MG tablet   atorvastatin (LIPITOR) 40 MG tablet   amLODipine (NORVASC) 5 MG tablet   Other Relevant Orders   Comprehensive metabolic panel (Completed)   Gout  Under good control on current regimen. Continue current regimen. Continue to monitor. Call with any concerns. Refills given. Labs drawn today.       Relevant Medications   allopurinol (ZYLOPRIM) 300 MG tablet   Other Relevant Orders   Comprehensive metabolic panel (Completed)   Uric acid (Completed)    Other Visit Diagnoses    Right foot pain       Relevant Orders   DG Foot Complete Right       Follow up plan: No follow-ups on file.

## 2019-10-27 ENCOUNTER — Telehealth: Payer: Self-pay

## 2019-10-27 LAB — COMPREHENSIVE METABOLIC PANEL
ALT: 13 IU/L (ref 0–44)
AST: 15 IU/L (ref 0–40)
Albumin/Globulin Ratio: 1.9 (ref 1.2–2.2)
Albumin: 4.1 g/dL (ref 3.8–4.8)
Alkaline Phosphatase: 59 IU/L (ref 48–121)
BUN/Creatinine Ratio: 16 (ref 10–24)
BUN: 16 mg/dL (ref 8–27)
Bilirubin Total: <0.2 mg/dL (ref 0.0–1.2)
CO2: 23 mmol/L (ref 20–29)
Calcium: 9 mg/dL (ref 8.6–10.2)
Chloride: 103 mmol/L (ref 96–106)
Creatinine, Ser: 1.03 mg/dL (ref 0.76–1.27)
GFR calc Af Amer: 90 mL/min/{1.73_m2} (ref >59)
GFR calc non Af Amer: 77 mL/min/{1.73_m2} (ref >59)
Globulin, Total: 2.2 g/dL (ref 1.5–4.5)
Glucose: 99 mg/dL (ref 65–99)
Potassium: 4.1 mmol/L (ref 3.5–5.2)
Sodium: 141 mmol/L (ref 134–144)
Total Protein: 6.3 g/dL (ref 6.0–8.5)

## 2019-10-27 LAB — URIC ACID: Uric Acid: 3.7 mg/dL — ABNORMAL LOW (ref 3.8–8.4)

## 2019-10-27 NOTE — Telephone Encounter (Signed)
Prior Authorization initiated via CoverMyMeds for [employer] [place]: [id number]

## 2019-10-28 ENCOUNTER — Encounter: Payer: Self-pay | Admitting: Family Medicine

## 2019-10-28 NOTE — Assessment & Plan Note (Signed)
Stable. Continue to monitor. Follow with specialist. Call with any concerns.

## 2019-10-28 NOTE — Assessment & Plan Note (Signed)
Under good control on current regimen. Continue current regimen. Continue to monitor. Call with any concerns. Refills given. Labs drawn today.   

## 2019-11-01 NOTE — Telephone Encounter (Signed)
PA approved.

## 2019-11-03 ENCOUNTER — Ambulatory Visit
Admission: RE | Admit: 2019-11-03 | Discharge: 2019-11-03 | Disposition: A | Discharge status: Home / Self Care | Payer: BC Managed Care – PPO | Attending: Family Medicine | Admitting: Family Medicine

## 2019-11-03 ENCOUNTER — Other Ambulatory Visit: Payer: Self-pay

## 2019-11-03 ENCOUNTER — Ambulatory Visit
Admission: RE | Admit: 2019-11-03 | Discharge: 2019-11-03 | Disposition: A | Discharge status: Home / Self Care | Payer: BC Managed Care – PPO | Source: Ambulatory Visit | Attending: Family Medicine | Admitting: Family Medicine

## 2019-11-03 DIAGNOSIS — M79671 Pain in right foot: Secondary | ICD-10-CM

## 2019-11-03 DIAGNOSIS — M7731 Calcaneal spur, right foot: Secondary | ICD-10-CM | POA: Diagnosis not present

## 2019-11-03 DIAGNOSIS — M19071 Primary osteoarthritis, right ankle and foot: Secondary | ICD-10-CM | POA: Diagnosis not present

## 2019-12-02 ENCOUNTER — Other Ambulatory Visit: Payer: Self-pay | Admitting: Family Medicine

## 2019-12-02 DIAGNOSIS — Z20822 Contact with and (suspected) exposure to covid-19: Secondary | ICD-10-CM | POA: Diagnosis not present

## 2019-12-02 DIAGNOSIS — M1000 Idiopathic gout, unspecified site: Secondary | ICD-10-CM

## 2019-12-02 DIAGNOSIS — Z03818 Encounter for observation for suspected exposure to other biological agents ruled out: Secondary | ICD-10-CM | POA: Diagnosis not present

## 2019-12-03 ENCOUNTER — Telehealth (INDEPENDENT_AMBULATORY_CARE_PROVIDER_SITE_OTHER): Payer: BC Managed Care – PPO | Admitting: Family Medicine

## 2019-12-03 ENCOUNTER — Encounter: Payer: Self-pay | Admitting: Family Medicine

## 2019-12-03 VITALS — BP 144/76 | HR 50 | Temp 99.1°F | Wt 208.0 lb

## 2019-12-03 DIAGNOSIS — R197 Diarrhea, unspecified: Secondary | ICD-10-CM | POA: Diagnosis not present

## 2019-12-03 MED ORDER — SIMETHICONE 40 MG/0.6ML PO SUSP: 40.0000 mg | Freq: Four times a day (QID) | ORAL | 1 refills | Status: DC | PRN

## 2019-12-03 MED ORDER — SUCRALFATE 1 G PO TABS: 1.0000 g | ORAL_TABLET | Freq: Three times a day (TID) | ORAL | 1 refills | Status: DC

## 2019-12-03 NOTE — Progress Notes (Signed)
BP (!) 144/76   Pulse (!) 50   Temp 99.1 F (37.3 C)   Wt 208 lb (94.3 kg)   BMI 31.63 kg/m    Subjective:    Patient ID: Daniel Cook, male    DOB: May 26, 1955, 64 y.o.   MRN: [id number]  HPI: Daniel Cook is a 64 y.o. male  Chief Complaint  Patient presents with  . Diarrhea    pt states he has been having a low grade fever (100.4 was the highest) and diarrhea since Wednesday morning. States he has not taken any medications for symptoms   ABDOMINAL ISSUES- COVID negative Duration: 2 days Nature: cramping Location: diffuse  Severity: moderate  Radiation: no Episode duration: Frequency: constant Alleviating factors: nothing Aggravating factors: eating Treatments attempted: PPI Constipation: no Diarrhea: yes Episodes of diarrhea/day: 10+ times Mucous in the stool: no Heartburn: no Bloating: yes Flatulence: yes Nausea: yes Vomiting: yes Episodes of vomit/day: 4-5 Melena or hematochezia: no Rash: no Jaundice: no Fever: yes- up to 100.4 Weight loss: no  + Sick Contacts  Relevant past medical, surgical, family and social history reviewed and updated as indicated. Interim medical history since our last visit reviewed. Allergies and medications reviewed and updated.  Review of Systems  Constitutional: Positive for fatigue and fever. Negative for activity change, appetite change, chills, diaphoresis and unexpected weight change.  HENT: Negative.   Respiratory: Negative.   Cardiovascular: Negative.   Gastrointestinal: Positive for abdominal pain, diarrhea and nausea. Negative for abdominal distention, anal bleeding, blood in stool, constipation, rectal pain and vomiting.  Genitourinary: Negative.   Musculoskeletal: Negative.   Skin: Negative.   Psychiatric/Behavioral: Negative.     Per HPI unless specifically indicated above     Objective:    BP (!) 144/76   Pulse (!) 50   Temp 99.1 F (37.3 C)   Wt 208 lb (94.3 kg)   BMI 31.63 kg/m   Wt  Readings from Last 3 Encounters:  12/03/19 208 lb (94.3 kg)  10/26/19 212 lb (96.2 kg)  08/16/19 212 lb 6 oz (96.3 kg)    Physical Exam Vitals and nursing note reviewed.  Pulmonary:     Effort: Pulmonary effort is normal. No respiratory distress.     Comments: Speaking in full sentences Neurological:     Mental Status: He is alert.  Psychiatric:        Mood and Affect: Mood normal.        Behavior: Behavior normal.        Thought Content: Thought content normal.        Judgment: Judgment normal.     Results for orders placed or performed in visit on 10/26/19  Comprehensive metabolic panel  Result Value Ref Range   Glucose 99 65 - 99 mg/dL   BUN 16 8 - 27 mg/dL   Creatinine, Ser 1.03 0.76 - 1.27 mg/dL   GFR calc non Af Amer 77 >59 mL/min/1.73   GFR calc Af Amer 90 >59 mL/min/1.73   BUN/Creatinine Ratio 16 10 - 24   Sodium 141 134 - 144 mmol/L   Potassium 4.1 3.5 - 5.2 mmol/L   Chloride 103 96 - 106 mmol/L   CO2 23 20 - 29 mmol/L   Calcium 9.0 8.6 - 10.2 mg/dL   Total Protein 6.3 6.0 - 8.5 g/dL   Albumin 4.1 3.8 - 4.8 g/dL   Globulin, Total 2.2 1.5 - 4.5 g/dL   Albumin/Globulin Ratio 1.9 1.2 - 2.2   Bilirubin Total <  0.2 0.0 - 1.2 mg/dL   Alkaline Phosphatase 59 48 - 121 IU/L   AST 15 0 - 40 IU/L   ALT 13 0 - 44 IU/L  Uric acid  Result Value Ref Range   Uric Acid 3.7 (L) 3.8 - 8.4 mg/dL      Assessment & Plan:   Problem List Items Addressed This Visit      Other   Diarrhea - Primary    COVID negative. Symptomatic care. Call if not 100% better by Monday and we'll do stool studies. Continue to monitor. Call with concerns.          Follow up plan: Return if symptoms worsen or fail to improve.    . This visit was completed via telephone due to the restrictions of the COVID-19 pandemic. All issues as above were discussed and addressed but no physical exam was performed. If it was felt that the patient should be evaluated in the office, they were directed there.  The patient verbally consented to this visit. Patient was unable to complete an audio/visual visit due to Lack of equipment. Due to the catastrophic nature of the COVID-19 pandemic, this visit was done through audio contact only. . Location of the patient: home . Location of the provider: work . Those involved with this call:  . Provider: Park Liter, DO . CMA: Yvonna Alanis, [place] . Front Desk/Registration: Don Perking  . Time spent on call: 21 minutes on the phone discussing health concerns. 23 minutes total spent in review of patient's record and preparation of their chart.

## 2019-12-03 NOTE — Assessment & Plan Note (Signed)
COVID negative. Symptomatic care. Call if not 100% better by Monday and we'll do stool studies. Continue to monitor. Call with concerns.

## 2019-12-03 NOTE — Patient Instructions (Signed)
Food Choices to Help Relieve Diarrhea, Adult When you have diarrhea, the foods you eat and your eating habits are very important. Choosing the right foods and drinks can help:  Relieve diarrhea.  Replace lost fluids and nutrients.  Prevent dehydration. What general guidelines should I follow?  Relieving diarrhea  Choose foods with less than 2 g or .07 oz. of fiber per serving.  Limit fats to less than 8 tsp (38 g or 1.34 oz.) a day.  Avoid the following: ? Foods and beverages sweetened with high-fructose corn syrup, honey, or sugar alcohols such as xylitol, sorbitol, and mannitol. ? Foods that contain a lot of fat or sugar. ? Fried, greasy, or spicy foods. ? High-fiber grains, breads, and cereals. ? Raw fruits and vegetables.  Eat foods that are rich in probiotics. These foods include dairy products such as yogurt and fermented milk products. They help increase healthy bacteria in the stomach and intestines (gastrointestinal tract, or GI tract).  If you have lactose intolerance, avoid dairy products. These may make your diarrhea worse.  Take medicine to help stop diarrhea (antidiarrheal medicine) only as told by your health care provider. Replacing nutrients  Eat small meals or snacks every 3-4 hours.  Eat bland foods, such as white rice, toast, or baked potato, until your diarrhea starts to get better. Gradually reintroduce nutrient-rich foods as tolerated or as told by your health care provider. This includes: ? Well-cooked protein foods. ? Peeled, seeded, and soft-cooked fruits and vegetables. ? Low-fat dairy products.  Take vitamin and mineral supplements as told by your health care provider. Preventing dehydration  Start by sipping water or a special solution to prevent dehydration (oral rehydration solution, ORS). Urine that is clear or pale yellow means that you are getting enough fluid.  Try to drink at least 8-10 cups of fluid each day to help replace lost  fluids.  You may add other liquids in addition to water, such as clear juice or decaffeinated sports drinks, as tolerated or as told by your health care provider.  Avoid drinks with caffeine, such as coffee, tea, or soft drinks.  Avoid alcohol. What foods are recommended?     The items listed may not be a complete list. Talk with your health care provider about what dietary choices are best for you. Grains White rice. White, French, or pita breads (fresh or toasted), including plain rolls, buns, or bagels. White pasta. Saltine, soda, or graham crackers. Pretzels. Low-fiber cereal. Cooked cereals made with water (such as cornmeal, farina, or cream cereals). Plain muffins. Matzo. Melba toast. Zwieback. Vegetables Potatoes (without the skin). Most well-cooked and canned vegetables without skins or seeds. Tender lettuce. Fruits Apple sauce. Fruits canned in juice. Cooked apricots, cherries, grapefruit, peaches, pears, or plums. Fresh bananas and cantaloupe. Meats and other protein foods Baked or boiled chicken. Eggs. Tofu. Fish. Seafood. Smooth nut butters. Ground or well-cooked tender beef, ham, veal, lamb, pork, or poultry. Dairy Plain yogurt, kefir, and unsweetened liquid yogurt. Lactose-free milk, buttermilk, skim milk, or soy milk. Low-fat or nonfat hard cheese. Beverages Water. Low-calorie sports drinks. Fruit juices without pulp. Strained tomato and vegetable juices. Decaffeinated teas. Sugar-free beverages not sweetened with sugar alcohols. Oral rehydration solutions, if approved by your health care provider. Seasoning and other foods Bouillon, broth, or soups made from recommended foods. What foods are not recommended? The items listed may not be a complete list. Talk with your health care provider about what dietary choices are best for you. Grains Whole   grain, whole wheat, bran, or rye breads, rolls, pastas, and crackers. Wild or brown rice. Whole grain or bran cereals. Barley.  Oats and oatmeal. Corn tortillas or taco shells. Granola. Popcorn. Vegetables Raw vegetables. Fried vegetables. Cabbage, broccoli, Brussels sprouts, artichokes, baked beans, beet greens, corn, kale, legumes, peas, sweet potatoes, and yams. Potato skins. Cooked spinach and cabbage. Fruits Dried fruit, including raisins and dates. Raw fruits. Stewed or dried prunes. Canned fruits with syrup. Meat and other protein foods Fried or fatty meats. Deli meats. Chunky nut butters. Nuts and seeds. Beans and lentils. Bacon. Hot dogs. Sausage. Dairy High-fat cheeses. Whole milk, chocolate milk, and beverages made with milk, such as milk shakes. Half-and-half. Cream. sour cream. Ice cream. Beverages Caffeinated beverages (such as coffee, tea, soda, or energy drinks). Alcoholic beverages. Fruit juices with pulp. Prune juice. Soft drinks sweetened with high-fructose corn syrup or sugar alcohols. High-calorie sports drinks. Fats and oils Butter. Cream sauces. Margarine. Salad oils. Plain salad dressings. Olives. Avocados. Mayonnaise. Sweets and desserts Sweet rolls, doughnuts, and sweet breads. Sugar-free desserts sweetened with sugar alcohols such as xylitol and sorbitol. Seasoning and other foods Honey. Hot sauce. Chili powder. Gravy. Cream-based or milk-based soups. Pancakes and waffles. Summary  When you have diarrhea, the foods you eat and your eating habits are very important.  Make sure you get at least 8-10 cups of fluid each day, or enough to keep your urine clear or pale yellow.  Eat bland foods and gradually reintroduce healthy, nutrient-rich foods as tolerated, or as told by your health care provider.  Avoid high-fiber, fried, greasy, or spicy foods. This information is not intended to replace advice given to you by your health care provider. Make sure you discuss any questions you have with your health care provider. Document Revised: 07/16/2018 Document Reviewed: 03/22/2016 Elsevier Patient  Education  2020 Elsevier Inc.  

## 2019-12-08 DIAGNOSIS — U071 COVID-19: Secondary | ICD-10-CM

## 2019-12-08 HISTORY — DX: COVID-19: U07.1

## 2019-12-16 DIAGNOSIS — Z20822 Contact with and (suspected) exposure to covid-19: Secondary | ICD-10-CM | POA: Diagnosis not present

## 2019-12-16 DIAGNOSIS — U071 COVID-19: Secondary | ICD-10-CM | POA: Diagnosis not present

## 2019-12-30 ENCOUNTER — Telehealth (INDEPENDENT_AMBULATORY_CARE_PROVIDER_SITE_OTHER): Payer: BC Managed Care – PPO | Admitting: Nurse Practitioner

## 2019-12-30 ENCOUNTER — Encounter: Payer: Self-pay | Admitting: Nurse Practitioner

## 2019-12-30 ENCOUNTER — Ambulatory Visit: Payer: Self-pay

## 2019-12-30 ENCOUNTER — Other Ambulatory Visit: Payer: Self-pay

## 2019-12-30 VITALS — BP 121/71 | HR 81 | Wt 192.0 lb

## 2019-12-30 DIAGNOSIS — Z8616 Personal history of COVID-19: Secondary | ICD-10-CM | POA: Diagnosis not present

## 2019-12-30 NOTE — Assessment & Plan Note (Signed)
Diagnosed 12/16/19.  Much improvement in symptoms but left with brain fog.  Discussed common long-term effects of COVID-19 including brain fog and headaches.  Encouraged continued use of vitamins and intake of nutritious foods.  Discussed stroke symptoms and with any symptoms that mimic stroke, go to ER.

## 2019-12-30 NOTE — Progress Notes (Signed)
BP 121/71    Pulse 81    Wt 192 lb (87.1 kg)    BMI 29.19 kg/m    Subjective:    Patient ID: [name], male    DOB: [birth year], 64 y.o.   MRN: [id number]  HPI: Win Guajardo is a 64 y.o. male presenting post COVID-19 infection.  Chief Complaint  Patient presents with   Dizziness    RX with covid 12/16/19. Still has cough, SOB, and slight headache. States that on Tuesday, he started having lightheadedness and like he was in a fog   HISTORY OF COVID-19 INFECTION Diagnosed with COVID 12/16/19 Worst symptom: foggy head, slightly dizziness Fever: no Cough: yes; can be productive with white/yellow Shortness of breath: yes - albuterol inhaler helps Wheezing: no Chest pain: no Chest tightness: no Chest congestion: no Nasal congestion: no Runny nose: yes Post nasal drip: no Sneezing: no Sore throat: no Swollen glands: no Sinus pressure: no Headache: yes; not severe - headache worse when foggy brain Face pain: no Toothache: no Ear pain: no  Ear pressure: no  Eyes red/itching:no Eye drainage/crusting: no  Nausea: no Vomiting: no  Appetite change: no; it is "picking up" Rash: no Fatigue: no Sick contacts: no Strep contacts: no  Context: stable Recurrent sinusitis: no Relief with OTC cold/cough medications: yes  Treatments attempted: ibuprofen, zinc, calcium Vitamin c, vitamin D, vitamin e   No Known Allergies  Outpatient Encounter Medications as of 12/30/2019  Medication Sig   albuterol (VENTOLIN HFA) 108 (90 Base) MCG/ACT inhaler INHALE 1 TO 2 PUFFS INTO THE LUNGS EVERY 6 HOURS AS NEEDED FOR WHEEZING OR SHORTNESS OF BREATH   allopurinol (ZYLOPRIM) 300 MG tablet Take 1 tablet (300 mg total) by mouth daily.   amLODipine (NORVASC) 5 MG tablet Take 1 tablet (5 mg total) by mouth daily.   aspirin 325 MG tablet Take 325 mg by mouth daily.   atorvastatin (LIPITOR) 40 MG tablet Take 1 tablet (40 mg total) by mouth daily.   colchicine 0.6 MG tablet Take 1  tablet (0.6 mg total) by mouth daily as needed.   ezetimibe (ZETIA) 10 MG tablet Take 1 tablet (10 mg total) by mouth daily.   gabapentin (NEURONTIN) 300 MG capsule TAKE 2 CAPSULES BY MOUTH  EACH MORNING AND 2 CAPSULES EACH EVENING   hydrochlorothiazide (HYDRODIURIL) 12.5 MG tablet Take 1 tablet (12.5 mg total) by mouth daily.   HYDROcodone-acetaminophen (NORCO/VICODIN) 5-325 MG tablet 1 po tid prn   loratadine (CLARITIN) 10 MG tablet Take 1 tablet (10 mg total) by mouth daily.   losartan (COZAAR) 100 MG tablet Take 1 tablet (100 mg total) by mouth daily.   naproxen (NAPROSYN) 500 MG tablet TAKE 1 TABLET BY MOUTH TWO  TIMES DAILY WITH A MEAL   omeprazole (PRILOSEC) 20 MG capsule Take 1 capsule (20 mg total) by mouth daily.   sucralfate (CARAFATE) 1 g tablet Take 1 tablet (1 g total) by mouth 4 (four) times daily -  with meals and at bedtime.   tiZANidine (ZANAFLEX) 4 MG tablet TAKE 1 TABLET BY MOUTH TWICE A DAY AS NEEDED   budesonide-formoterol (SYMBICORT) 160-4.5 MCG/ACT inhaler Inhale 2 puffs into the lungs 2 (two) times daily. (Patient not taking: Reported on 07/27/2019)   ondansetron (ZOFRAN) 4 MG tablet Take 1 tablet (4 mg total) by mouth every 8 (eight) hours as needed for nausea or vomiting. (Patient not taking: Reported on 08/10/2019)   simethicone (MYLICON) 40 XK/4.8JE drops Take 0.6 mLs (40 mg  total) by mouth 4 (four) times daily as needed for flatulence. (Patient not taking: Reported on 12/30/2019)   No facility-administered encounter medications on file as of 12/30/2019.   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 Interim medical history since our last visit reviewed.  Review of Systems  Constitutional: Positive for fatigue. Negative for activity change, appetite change and fever.  HENT: Negative.  Negative for dental problem, ear discharge, ear pain, postnasal drip, rhinorrhea, sinus pressure, sinus pain, sneezing, sore throat and trouble swallowing.   Eyes: Negative.   Respiratory: Positive for cough and shortness of breath. Negative for chest tightness and wheezing.   Cardiovascular: Negative.  Negative for chest pain.  Gastrointestinal: Negative.  Negative for nausea and vomiting.  Musculoskeletal: Negative.   Skin: Negative.  Negative for rash.  Neurological: Positive for light-headedness and headaches. Negative for dizziness, speech difficulty, weakness and numbness.       + "foggy head"    Per HPI unless specifically indicated above     Objective:    BP 121/71    Pulse 81    Wt 192 lb (87.1 kg)    BMI 29.19 kg/m   Wt Readings from Last 3 Encounters:  12/30/19 192 lb (87.1 kg)  12/03/19 208 lb (94.3 kg)  10/26/19 212 lb (96.2 kg)    Physical Exam Physical examination unable to be performed due to lack of equipment.     Assessment & Plan:   Problem List Items Addressed This Visit      Other   Personal history of covid-19 - Primary    Diagnosed 12/16/19.  Much improvement in symptoms but left with brain fog.  Discussed common long-term effects of COVID-19 including brain fog and headaches.  Encouraged continued use of vitamins and intake of nutritious foods.  Discussed stroke symptoms and with any symptoms that mimic stroke, go to ER.          Follow up plan: Return if symptoms worsen or fail to improve.

## 2019-12-30 NOTE — Telephone Encounter (Signed)
Pt. Reports he was diagnosed with COVID 19 12/16/19 at [facility]. Still having weakness and "Foggy headed." Going back to work this coming Monday. Requests to talk to someone about continued symptoms. Spoke with [place] in the practice. Virtual visit made.  Reason for Disposition . [1] PERSISTING SYMPTOMS OF COVID-19 AND [2] symptoms WORSE  Answer Assessment - Initial Assessment Questions 1. COVID-19 ONSET: "When did the symptoms of COVID-19 first start?"     12/16/19 2. DIAGNOSIS CONFIRMATION: "How were you diagnosed?" (e.g., COVID-19 oral or nasal viral test; COVID-19 antibody test; doctor visit)     [facility] 3. MAIN SYMPTOM:  "What is your main concern or symptom right now?" (e.g., breathing difficulty, cough, fatigue. loss of smell)     Weakness, foggy head 4. SYMPTOM ONSET: "When did the  fogginess  start?"     Started Monday 5. BETTER-SAME-WORSE: "Are you getting better, staying the same, or getting worse over the last 1 to 2 weeks?"     Better 6. RECENT MEDICAL VISIT: "Have you been seen by a healthcare provider (doctor, NP, PA) for these persisting COVID-19 symptoms?" If Yes, ask: "When were you seen?" (e.g., date)     No 7. COUGH: "Do you have a cough?" If Yes, ask: "How bad is the cough?"       Mild 8. FEVER: "Do you have a fever?" If Yes, ask: "What is your temperature, how was it measured, and when did it start?"     No 9. BREATHING DIFFICULTY: "Are you having any trouble breathing?" If Yes, ask: "How bad is your breathing?" (e.g., mild, moderate, severe)    - MILD: No SOB at rest, mild SOB with walking, speaks normally in sentences, can lay down, no retractions, pulse < 100.    - MODERATE: SOB at rest, SOB with minimal exertion and prefers to sit, cannot lie down flat, speaks in phrases, mild retractions, audible wheezing, pulse 100-120.    - SEVERE: Very SOB at rest, speaks in single words, struggling to breathe, sitting hunched forward, retractions, pulse > 120       Mild 10. HIGH RISK  DISEASE: "Do you have any chronic medical problems?" (e.g., asthma, heart or lung disease, weak immune system, obesity, etc.)       Allergies 11. PREGNANCY: "Is there any chance you are pregnant?" "When was your last menstrual period?"       n/a 12. OTHER SYMPTOMS: "Do you have any other symptoms?"  (e.g., fatigue, headache, muscle pain, weakness)       Weak  Protocols used: CORONAVIRUS (COVID-19) PERSISTING SYMPTOMS FOLLOW-UP CALL-A-AH

## 2019-12-30 NOTE — Patient Instructions (Signed)
10 Things You Can Do to Manage Your COVID-19 Symptoms at Home If you have possible or confirmed COVID-19: 1. Stay home from work and school. And stay away from other public places. If you must go out, avoid using any kind of public transportation, ridesharing, or taxis. 2. Monitor your symptoms carefully. If your symptoms get worse, call your healthcare provider immediately. 3. Get rest and stay hydrated. 4. If you have a medical appointment, call the healthcare provider ahead of time and tell them that you have or may have COVID-19. 5. For medical emergencies, call 911 and notify the dispatch personnel that you have or may have COVID-19. 6. Cover your cough and sneezes with a tissue or use the inside of your elbow. 7. Wash your hands often with soap and water for at least 20 seconds or clean your hands with an alcohol-based hand sanitizer that contains at least 60% alcohol. 8. As much as possible, stay in a specific room and away from other people in your home. Also, you should use a separate bathroom, if available. If you need to be around other people in or outside of the home, wear a mask. 9. Avoid sharing personal items with other people in your household, like dishes, towels, and bedding. 10. Clean all surfaces that are touched often, like counters, tabletops, and doorknobs. Use household cleaning sprays or wipes according to the label instructions. cdc.gov/coronavirus 10/07/2018 This information is not intended to replace advice given to you by your health care provider. Make sure you discuss any questions you have with your health care provider. Document Revised: 03/11/2019 Document Reviewed: 03/11/2019 Elsevier Patient Education  2020 Elsevier Inc.  

## 2020-01-11 ENCOUNTER — Ambulatory Visit: Payer: Self-pay

## 2020-01-11 NOTE — Telephone Encounter (Signed)
Patient called stating that he has been constipated for 4-5 days. Only able to pass round balls medium sized after 30 minutes of effort and manual extraction today. He states that the stool is normal color no blood no hemorrhoids. He has been taking miralax  And stool softeners. He states that the stool softeners have helped some. He states that he has always been regular. He has recently recovered from COVID-19 and returned to work 9/27.  He walks all day. He has started simethicone recently. He drinks 2 cups of coffee daily at least 32 oz of water and two green teas. He is still taking dietary suppliments and zinc that he started with his COVID-19 dx. Per protocol patient will go to UC for evaluation. No appointment for several days available in office. Care advice read to patient. He verbalized understanding.  Reason for Disposition . Unable to have a bowel movement (BM) without manually removing stool (using finger to pull out stool or perform disimpaction)  Answer Assessment - Initial Assessment Questions 1. STOOL PATTERN OR FREQUENCY: "How often do you pass bowel movements (BMs)?"  (Normal range: tid to q 3 days)  "When was the last BM passed?"       4-5 day today small needed manual help 2. STRAINING: "Do you have to strain to have a BM?"     yes 3. RECTAL PAIN: "Does your rectum hurt when the stool comes out?" If Yes, ask: "Do you have hemorrhoids? How bad is the pain?"  (Scale 1-10; or mild, moderate, severe)  more pressure no pain or hemorrhoids 4. STOOL COMPOSITION: "Are the stools hard?"     Hard medium balls brown 5. BLOOD ON STOOLS: "Has there been any blood on the toilet tissue or on the surface of the BM?" If Yes, ask: "When was the last time?"    No 6. CHRONIC CONSTIPATION: "Is this a new problem for you?"  If no, ask: "How long have you had this problem?" (days, weeks, months)      9/30 start of symptoms 7. CHANGES IN DIET OR HYDRATION: "Have there been any recent changes in your  diet?" "How much fluids are you drinking consuming on a daily basis?"  "How much have you had to drink today?"    simethicon added 32+ oz of smart water, 2 green teas 2c of coffee each day 8. MEDICATIONS: "Have you been taking any new medications?" "Are you taking any narcotic pain medications?" (e.g., Vicoden, Percocet, morphine, dilaudid)    Narcotic chronic use new simethicon, Zink quercetin food supliment 9. LAXATIVES: "Have you been using any stool softeners, laxatives, or enemas?"  If yes, ask "What, how often, and when was the last time?" Stool softener and myralax 10.ACTIVITY:  "How much walking do you do every day? on a daily basis?"  "Has your activity level decreased in the past week?"        constant 11. CAUSE: "What do you think is causing the constipation?"      unsure 12. OTHER SYMPTOMS: "Do you have any other symptoms?" (e.g., abdominal pain, bloating, fever, vomiting)      bloating 13. MEDICAL HISTORY: "Do you have a history of hemorrhoids, rectal fissures, or rectal surgery or rectal abscess?"         no 14. PREGNANCY: "Is there any chance you are pregnant?" "When was your last menstrual period?"       N/A  Protocols used: CONSTIPATION-A-AH

## 2020-01-12 DIAGNOSIS — K5900 Constipation, unspecified: Secondary | ICD-10-CM | POA: Diagnosis not present

## 2020-01-20 DIAGNOSIS — M5416 Radiculopathy, lumbar region: Secondary | ICD-10-CM | POA: Diagnosis not present

## 2020-01-20 DIAGNOSIS — M5136 Other intervertebral disc degeneration, lumbar region: Secondary | ICD-10-CM | POA: Diagnosis not present

## 2020-01-26 ENCOUNTER — Other Ambulatory Visit: Payer: Self-pay | Admitting: Family Medicine

## 2020-01-26 DIAGNOSIS — M5136 Other intervertebral disc degeneration, lumbar region: Secondary | ICD-10-CM

## 2020-01-26 DIAGNOSIS — E7800 Pure hypercholesterolemia, unspecified: Secondary | ICD-10-CM

## 2020-01-26 DIAGNOSIS — M1000 Idiopathic gout, unspecified site: Secondary | ICD-10-CM

## 2020-01-26 MED ORDER — NAPROXEN 500 MG PO TABS: ORAL_TABLET | ORAL | 0 refills | Status: DC

## 2020-01-26 MED ORDER — EZETIMIBE 10 MG PO TABS: 10.0000 mg | ORAL_TABLET | Freq: Every day | ORAL | 1 refills | Status: DC

## 2020-01-26 MED ORDER — OMEPRAZOLE 20 MG PO CPDR: 20.0000 mg | DELAYED_RELEASE_CAPSULE | Freq: Every day | ORAL | 1 refills | Status: DC

## 2020-01-26 NOTE — Telephone Encounter (Signed)
PT need a refill  omeprazole (PRILOSEC) 20 MG capsule [[id number]]  ezetimibe (ZETIA) 10 MG tablet [[id number]]  naproxen (NAPROSYN) 500 MG tablet [[id number]]  [facility] - [place], [place] - [address]  [place], [place] [address]  Phone:  [phone] Fax:  [phone]

## 2020-01-26 NOTE — Addendum Note (Signed)
Addended by: Valli Glance F on: 01/26/2020 08:26 AM   Modules accepted: Orders

## 2020-02-08 ENCOUNTER — Other Ambulatory Visit: Payer: Self-pay | Admitting: Family Medicine

## 2020-02-08 NOTE — Telephone Encounter (Signed)
Requested Prescriptions  Pending Prescriptions Disp Refills   sucralfate (CARAFATE) 1 g tablet [Pharmacy Med Name: SUCRALFATE 1 GM TAB] 120 tablet 1    Sig: TAKE 1 TABLET BY MOUTH FOUR TIMES DAILY WITH MEALS AND AT BEDTIME     Gastroenterology: Antiacids Passed - 02/08/2020  1:01 PM      Passed - Valid encounter within last 12 months    Recent Outpatient Visits          1 month ago Personal history of covid-19   [facility] Eulogio Bear, NP   2 months ago Diarrhea, unspecified type   [facility] [place], DO   3 months ago Essential hypertension   [place], [place], DO   6 months ago Essential hypertension   [place], [place], DO   6 months ago Essential hypertension   [place], [place], DO      Future Appointments            In 2 months Wynetta Emery, Barb Merino, DO [employer]

## 2020-02-11 ENCOUNTER — Other Ambulatory Visit: Payer: Self-pay | Admitting: Family Medicine

## 2020-02-11 MED ORDER — ALBUTEROL SULFATE HFA 108 (90 BASE) MCG/ACT IN AERS: INHALATION_SPRAY | RESPIRATORY_TRACT | 1 refills | Status: DC

## 2020-02-11 NOTE — Telephone Encounter (Signed)
PT need a refill  albuterol (VENTOLIN HFA) 108 (90 Base) MCG/ACT inhaler [[id number]]  [facility] - [place], [place]  [place] [address]  Phone: [phone] Fax: [phone]

## 2020-02-11 NOTE — Addendum Note (Signed)
Addended by: Kathrin Ruddy L on: 02/11/2020 10:00 AM   Modules accepted: Orders

## 2020-03-13 ENCOUNTER — Telehealth: Payer: Self-pay

## 2020-03-13 NOTE — Telephone Encounter (Signed)
Routing to provider  

## 2020-03-13 NOTE — Telephone Encounter (Signed)
OK for virtual, but will need an appt

## 2020-03-13 NOTE — Telephone Encounter (Signed)
Copied from [place] [id number]. 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 Say due to his hectic work schedule its hard to schedule a visit. Please call patient at Ph# [phone]

## 2020-03-16 ENCOUNTER — Ambulatory Visit: Payer: BC Managed Care – PPO | Admitting: Family Medicine

## 2020-03-20 NOTE — Telephone Encounter (Signed)
Pt has apt on 03/16/2020

## 2020-04-03 DIAGNOSIS — M5416 Radiculopathy, lumbar region: Secondary | ICD-10-CM | POA: Diagnosis not present

## 2020-04-03 DIAGNOSIS — M5126 Other intervertebral disc displacement, lumbar region: Secondary | ICD-10-CM | POA: Diagnosis not present

## 2020-04-03 DIAGNOSIS — M5136 Other intervertebral disc degeneration, lumbar region: Secondary | ICD-10-CM | POA: Diagnosis not present

## 2020-04-08 DIAGNOSIS — I700 Atherosclerosis of aorta: Secondary | ICD-10-CM

## 2020-04-08 HISTORY — DX: Atherosclerosis of aorta: I70.0

## 2020-04-27 ENCOUNTER — Other Ambulatory Visit: Payer: Self-pay | Admitting: Family Medicine

## 2020-04-27 DIAGNOSIS — M1000 Idiopathic gout, unspecified site: Secondary | ICD-10-CM

## 2020-04-27 DIAGNOSIS — M5136 Other intervertebral disc degeneration, lumbar region: Secondary | ICD-10-CM

## 2020-05-02 ENCOUNTER — Encounter: Payer: Self-pay | Admitting: Family Medicine

## 2020-05-02 ENCOUNTER — Ambulatory Visit: Payer: BC Managed Care – PPO | Admitting: Family Medicine

## 2020-05-02 ENCOUNTER — Other Ambulatory Visit: Payer: Self-pay

## 2020-05-02 VITALS — BP 132/75 | HR 49 | Temp 98.9°F | Wt 218.0 lb

## 2020-05-02 DIAGNOSIS — E7800 Pure hypercholesterolemia, unspecified: Secondary | ICD-10-CM | POA: Diagnosis not present

## 2020-05-02 DIAGNOSIS — M5136 Other intervertebral disc degeneration, lumbar region: Secondary | ICD-10-CM | POA: Diagnosis not present

## 2020-05-02 DIAGNOSIS — M1000 Idiopathic gout, unspecified site: Secondary | ICD-10-CM

## 2020-05-02 DIAGNOSIS — I10 Essential (primary) hypertension: Secondary | ICD-10-CM

## 2020-05-02 DIAGNOSIS — Z0000 Encounter for general adult medical examination without abnormal findings: Secondary | ICD-10-CM | POA: Diagnosis not present

## 2020-05-02 LAB — URINALYSIS, ROUTINE W REFLEX MICROSCOPIC
Bilirubin, UA: NEGATIVE
Glucose, UA: NEGATIVE
Ketones, UA: NEGATIVE
Leukocytes,UA: NEGATIVE
Nitrite, UA: NEGATIVE
Protein,UA: NEGATIVE
RBC, UA: NEGATIVE
Specific Gravity, UA: 1.025 (ref 1.005–1.030)
Urobilinogen, Ur: 0.2 mg/dL (ref 0.2–1.0)
pH, UA: 6 (ref 5.0–7.5)

## 2020-05-02 LAB — MICROALBUMIN, URINE WAIVED
Creatinine, Urine Waived: 200 mg/dL (ref 10–300)
Microalb, Ur Waived: 10 mg/L (ref 0–19)
Microalb/Creat Ratio: <30 mg/g (ref <30)

## 2020-05-02 MED ORDER — ALLOPURINOL 300 MG PO TABS: 300.0000 mg | ORAL_TABLET | Freq: Every day | ORAL | 1 refills | Status: DC

## 2020-05-02 MED ORDER — OMEPRAZOLE 20 MG PO CPDR: 20.0000 mg | DELAYED_RELEASE_CAPSULE | Freq: Every day | ORAL | 1 refills | Status: DC

## 2020-05-02 MED ORDER — HYDROCHLOROTHIAZIDE 12.5 MG PO TABS: 12.5000 mg | ORAL_TABLET | Freq: Every day | ORAL | 1 refills | Status: DC

## 2020-05-02 MED ORDER — NAPROXEN 500 MG PO TABS
ORAL_TABLET | ORAL | 1 refills | Status: DC
Start: 2020-05-02 — End: 2020-12-13

## 2020-05-02 MED ORDER — AMLODIPINE BESYLATE 5 MG PO TABS: 5.0000 mg | ORAL_TABLET | Freq: Every day | ORAL | 1 refills | Status: DC

## 2020-05-02 MED ORDER — BUDESONIDE-FORMOTEROL FUMARATE 160-4.5 MCG/ACT IN AERO: 2.0000 | INHALATION_SPRAY | Freq: Two times a day (BID) | RESPIRATORY_TRACT | 3 refills | Status: DC

## 2020-05-02 MED ORDER — EZETIMIBE 10 MG PO TABS: 10.0000 mg | ORAL_TABLET | Freq: Every day | ORAL | 1 refills | Status: DC

## 2020-05-02 MED ORDER — ALBUTEROL SULFATE HFA 108 (90 BASE) MCG/ACT IN AERS: INHALATION_SPRAY | RESPIRATORY_TRACT | 1 refills | Status: DC

## 2020-05-02 MED ORDER — ATORVASTATIN CALCIUM 40 MG PO TABS: 40.0000 mg | ORAL_TABLET | Freq: Every day | ORAL | 1 refills | Status: DC

## 2020-05-02 MED ORDER — LOSARTAN POTASSIUM 100 MG PO TABS: 100.0000 mg | ORAL_TABLET | Freq: Every day | ORAL | 1 refills | Status: DC

## 2020-05-02 MED ORDER — LORATADINE 10 MG PO TABS: 10.0000 mg | ORAL_TABLET | Freq: Every day | ORAL | 0 refills | Status: DC

## 2020-05-02 MED ORDER — GABAPENTIN 300 MG PO CAPS: 600.0000 mg | ORAL_CAPSULE | Freq: Three times a day (TID) | ORAL | 1 refills | Status: DC

## 2020-05-02 MED ORDER — COLCHICINE 0.6 MG PO TABS: 0.6000 mg | ORAL_TABLET | Freq: Every day | ORAL | 1 refills | Status: DC | PRN

## 2020-05-02 NOTE — Progress Notes (Signed)
BP 132/75   Pulse (!) 49   Temp 98.9 F (37.2 C) (Oral)   Wt 218 lb (98.9 kg)   SpO2 96%   BMI 33.15 kg/m    Subjective:    Patient ID: Daniel Cook, male    DOB: [birth year], 65 y.o.   MRN: [id number]  HPI: Daniel Cook is a 65 y.o. male presenting on 05/02/2020 for comprehensive medical examination. Current medical complaints include:  HYPERTENSION / HYPERLIPIDEMIA Satisfied with current treatment? no Duration of hypertension: chronic BP monitoring frequency: daily BP range: 130s/70s BP medication side effects: no Duration of hyperlipidemia: chronic Cholesterol medication side effects: no Cholesterol supplements: none Past cholesterol medications: zetia, atorvastatin Medication compliance: excellent compliance Aspirin: yes Recent stressors: no Recurrent headaches: no Visual changes: no Palpitations: no Dyspnea: no Chest pain: no Lower extremity edema: no Dizzy/lightheaded: no  No gout flares.  Interim Problems from his last visit: yes- sciatica  Depression Screen done today and results listed below:  Depression screen [facility] 2/9 04/27/2019 03/29/2019 08/22/2017 08/05/2017 11/21/2015  Decreased Interest 0 0 0 0 0  Down, Depressed, Hopeless 0 0 0 0 0  PHQ - 2 Score 0 0 0 0 0  Altered sleeping 0 - - - -  Tired, decreased energy 0 - - - -  Change in appetite 0 - - - -  Feeling bad or failure about yourself  0 - - - -  Trouble concentrating 0 - - - -  Moving slowly or fidgety/restless 0 - - - -  Suicidal thoughts 0 - - - -  PHQ-9 Score 0 - - - -  Difficult doing work/chores Not difficult at all - - - -    Past Medical History:  Past Medical History:  Diagnosis Date  . Gout   . High cholesterol   . Hypertension   . Obesity   . Sleep apnea     Surgical History:  Past Surgical History:  Procedure Laterality Date  . knee surgeries      Medications:  Current Outpatient Medications on File Prior to Visit  Medication Sig  . aspirin 325 MG tablet  Take 325 mg by mouth daily.  Marland Kitchen HYDROcodone-acetaminophen (NORCO/VICODIN) 5-325 MG tablet 1 po tid prn  . ondansetron (ZOFRAN) 4 MG tablet Take 1 tablet (4 mg total) by mouth every 8 (eight) hours as needed for nausea or vomiting.  . simethicone (MYLICON) 40 [id number] drops Take 0.6 mLs (40 mg total) by mouth 4 (four) times daily as needed for flatulence.  . sucralfate (CARAFATE) 1 g tablet TAKE 1 TABLET BY MOUTH FOUR TIMES DAILY WITH MEALS AND AT BEDTIME  . tiZANidine (ZANAFLEX) 4 MG tablet TAKE 1 TABLET BY MOUTH TWICE A DAY AS NEEDED   No current facility-administered medications on file prior to visit.    Allergies:  No Known Allergies  Social History:  Social History   Socioeconomic History  . Marital status: Divorced    Spouse name: Not on file  . Number of children: Not on file  . Years of education: Not on file  . Highest education level: Not on file  Occupational History  . Not on file  Tobacco Use  . Smoking status: Former Smoker    Packs/day: 1.50    Years: 20.00    Pack years: 30.00    Types: Cigarettes    Quit date: 10/04/1998    Years since quitting: 21.6  . Smokeless tobacco: Current User    Types: Chew  .  Tobacco comment: dip occassionally  Vaping Use  . Vaping Use: Never used  Substance and Sexual Activity  . Alcohol use: Yes    Comment: occ  . Drug use: No    Types: Marijuana  . Sexual activity: Not on file  Other Topics Concern  . Not on file  Social History Narrative  . Not on file   Social Determinants of Health   Financial Resource Strain: Not on file  Food Insecurity: Not on file  Transportation Needs: Not on file  Physical Activity: Not on file  Stress: Not on file  Social Connections: Not on file  Intimate Partner Violence: Not on file   Social History   Tobacco Use  Smoking Status Former Smoker  . Packs/day: 1.50  . Years: 20.00  . Pack years: 30.00  . Types: Cigarettes  . Quit date: 10/04/1998  . Years since quitting: 21.6   Smokeless Tobacco Current User  . Types: Chew  Tobacco Comment   dip occassionally   Social History   Substance and Sexual Activity  Alcohol Use Yes   Comment: occ    Family History:  Family History  Problem Relation Age of Onset  . Hyperlipidemia Father   . Hypertension Father   . Heart disease Father   . Heart attack Father 72    Past medical history, surgical history, medications, allergies, family history and social history reviewed with patient today and changes made to appropriate areas of the chart.   Review of Systems  Constitutional: Negative.   HENT: Negative.   Eyes: Negative.   Respiratory: Negative.   Cardiovascular: Negative.   Gastrointestinal: Negative.   Genitourinary: Positive for frequency. Negative for dysuria, flank pain, hematuria and urgency.  Musculoskeletal: Positive for back pain. Negative for falls, joint pain, myalgias and neck pain.  Skin: Negative.   Neurological: Negative.   Endo/Heme/Allergies: Negative.   Psychiatric/Behavioral: Negative.    All other ROS negative except what is listed above and in the HPI.      Objective:    BP 132/75   Pulse (!) 49   Temp 98.9 F (37.2 C) (Oral)   Wt 218 lb (98.9 kg)   SpO2 96%   BMI 33.15 kg/m   Wt Readings from Last 3 Encounters:  05/02/20 218 lb (98.9 kg)  12/30/19 192 lb (87.1 kg)  12/03/19 208 lb (94.3 kg)    Physical Exam Vitals and nursing note reviewed.  Constitutional:      General: He is not in acute distress.    Appearance: Normal appearance. He is obese. He is not ill-appearing, toxic-appearing or diaphoretic.  HENT:     Head: Normocephalic and atraumatic.     Right Ear: Tympanic membrane, ear canal and external ear normal. There is no impacted cerumen.     Left Ear: Tympanic membrane, ear canal and external ear normal. There is no impacted cerumen.     Nose: Nose normal. No congestion or rhinorrhea.     Mouth/Throat:     Mouth: Mucous membranes are moist.     Pharynx:  Oropharynx is clear. No oropharyngeal exudate or posterior oropharyngeal erythema.  Eyes:     General: No scleral icterus.       Right eye: No discharge.        Left eye: No discharge.     Extraocular Movements: Extraocular movements intact.     Conjunctiva/sclera: Conjunctivae normal.     Pupils: Pupils are equal, round, and reactive to light.  Neck:  Vascular: No carotid bruit.  Cardiovascular:     Rate and Rhythm: Normal rate and regular rhythm.     Pulses: Normal pulses.     Heart sounds: No murmur heard. No friction rub. No gallop.   Pulmonary:     Effort: Pulmonary effort is normal. No respiratory distress.     Breath sounds: Normal breath sounds. No stridor. No wheezing, rhonchi or rales.  Chest:     Chest wall: No tenderness.  Abdominal:     General: Abdomen is flat. Bowel sounds are normal. There is no distension.     Palpations: Abdomen is soft. There is no mass.     Tenderness: There is no abdominal tenderness. There is no right CVA tenderness, left CVA tenderness, guarding or rebound.     Hernia: No hernia is present.  Genitourinary:    Comments: Genital exam deferred with shared decision making Musculoskeletal:        General: No swelling, tenderness, deformity or signs of injury.     Cervical back: Normal range of motion and neck supple. No rigidity. No muscular tenderness.     Right lower leg: No edema.     Left lower leg: No edema.  Lymphadenopathy:     Cervical: No cervical adenopathy.  Skin:    General: Skin is warm and dry.     Capillary Refill: Capillary refill takes less than 2 seconds.     Coloration: Skin is not jaundiced or pale.     Findings: No bruising, erythema, lesion or rash.  Neurological:     General: No focal deficit present.     Mental Status: He is alert and oriented to person, place, and time.     Cranial Nerves: No cranial nerve deficit.     Sensory: No sensory deficit.     Motor: No weakness.     Coordination: Coordination normal.      Gait: Gait normal.     Deep Tendon Reflexes: Reflexes normal.  Psychiatric:        Mood and Affect: Mood normal.        Behavior: Behavior normal.        Thought Content: Thought content normal.        Judgment: Judgment normal.     Results for orders placed or performed in visit on 05/02/20  Comprehensive metabolic panel  Result Value Ref Range   Glucose 76 65 - 99 mg/dL   BUN 24 8 - 27 mg/dL   Creatinine, Ser 0.87 0.76 - 1.27 mg/dL   GFR calc non Af Amer 92 >59 mL/min/1.73   GFR calc Af Amer 107 >59 mL/min/1.73   BUN/Creatinine Ratio 28 (H) 10 - 24   Sodium 140 134 - 144 mmol/L   Potassium 3.9 3.5 - 5.2 mmol/L   Chloride 100 96 - 106 mmol/L   CO2 25 20 - 29 mmol/L   Calcium 9.1 8.6 - 10.2 mg/dL   Total Protein 6.6 6.0 - 8.5 g/dL   Albumin 4.2 3.8 - 4.8 g/dL   Globulin, Total 2.4 1.5 - 4.5 g/dL   Albumin/Globulin Ratio 1.8 1.2 - 2.2   Bilirubin Total <0.2 0.0 - 1.2 mg/dL   Alkaline Phosphatase 62 44 - 121 IU/L   AST 18 0 - 40 IU/L   ALT 18 0 - 44 IU/L  CBC with Differential/Platelet  Result Value Ref Range   WBC 7.8 3.4 - 10.8 x10E3/uL   RBC 4.13 (L) 4.14 - 5.80 x10E6/uL   Hemoglobin 13.2 13.0 -  17.7 g/dL   Hematocrit 38.4 37.5 - 51.0 %   MCV 93 79 - 97 fL   MCH 32.0 26.6 - 33.0 pg   MCHC 34.4 31.5 - 35.7 g/dL   RDW 12.6 11.6 - 15.4 %   Platelets 227 150 - 450 x10E3/uL   Neutrophils 57 Not Estab. %   Lymphs 25 Not Estab. %   Monocytes 11 Not Estab. %   Eos 6 Not Estab. %   Basos 1 Not Estab. %   Neutrophils Absolute 4.4 1.4 - 7.0 x10E3/uL   Lymphocytes Absolute 1.9 0.7 - 3.1 x10E3/uL   Monocytes Absolute 0.9 0.1 - 0.9 x10E3/uL   EOS (ABSOLUTE) 0.5 (H) 0.0 - 0.4 x10E3/uL   Basophils Absolute 0.1 0.0 - 0.2 x10E3/uL   Immature Granulocytes 0 Not Estab. %   Immature Grans (Abs) 0.0 0.0 - 0.1 x10E3/uL  Lipid Panel w/o Chol/HDL Ratio  Result Value Ref Range   Cholesterol, Total 216 (H) 100 - 199 mg/dL   Triglycerides 301 (H) 0 - 149 mg/dL   HDL 55 >39 mg/dL    VLDL Cholesterol Cal 51 (H) 5 - 40 mg/dL   LDL Chol Calc (NIH) 110 (H) 0 - 99 mg/dL  PSA  Result Value Ref Range   Prostate Specific Ag, Serum 0.4 0.0 - 4.0 ng/mL  TSH  Result Value Ref Range   TSH 1.530 0.450 - 4.500 uIU/mL  Urinalysis, Routine w reflex microscopic  Result Value Ref Range   Specific Gravity, UA 1.025 1.005 - 1.030   pH, UA 6.0 5.0 - 7.5   Color, UA Yellow Yellow   Appearance Ur Clear Clear   Leukocytes,UA Negative Negative   Protein,UA Negative Negative/Trace   Glucose, UA Negative Negative   Ketones, UA Negative Negative   RBC, UA Negative Negative   Bilirubin, UA Negative Negative   Urobilinogen, Ur 0.2 0.2 - 1.0 mg/dL   Nitrite, UA Negative Negative  Uric acid  Result Value Ref Range   Uric Acid 4.7 3.8 - 8.4 mg/dL  Microalbumin, Urine Waived  Result Value Ref Range   Microalb, Ur Waived 10 0 - 19 mg/L   Creatinine, Urine Waived 200 10 - 300 mg/dL   Microalb/Creat Ratio <30 <30 mg/g      Assessment & Plan:   Problem List Items Addressed This Visit      Cardiovascular and Mediastinum   Hypertension    Under good control on current regimen. Continue current regimen. Continue to monitor. Call with any concerns. Refills given. Labs drawn today.       Relevant Medications   losartan (COZAAR) 100 MG tablet   hydrochlorothiazide (HYDRODIURIL) 12.5 MG tablet   ezetimibe (ZETIA) 10 MG tablet   atorvastatin (LIPITOR) 40 MG tablet   amLODipine (NORVASC) 5 MG tablet   Other Relevant Orders   Comprehensive metabolic panel (Completed)   TSH (Completed)   Microalbumin, Urine Waived (Completed)     Musculoskeletal and Integument   DDD (degenerative disc disease), lumbar    Under good control on current regimen. Continue current regimen. Continue to monitor. Call with any concerns. Refills given. Labs drawn today.       Relevant Medications   naproxen (NAPROSYN) 500 MG tablet   gabapentin (NEURONTIN) 300 MG capsule   colchicine 0.6 MG tablet    allopurinol (ZYLOPRIM) 300 MG tablet     Other   High cholesterol    Under good control on current regimen. Continue current regimen. Continue to monitor. Call with any  concerns. Refills given. Labs drawn today.       Relevant Medications   losartan (COZAAR) 100 MG tablet   hydrochlorothiazide (HYDRODIURIL) 12.5 MG tablet   ezetimibe (ZETIA) 10 MG tablet   atorvastatin (LIPITOR) 40 MG tablet   amLODipine (NORVASC) 5 MG tablet   Other Relevant Orders   Comprehensive metabolic panel (Completed)   Lipid Panel w/o Chol/HDL Ratio (Completed)   Gout    Under good control on current regimen. Continue current regimen. Continue to monitor. Call with any concerns. Refills given. Labs drawn today.       Relevant Medications   naproxen (NAPROSYN) 500 MG tablet   allopurinol (ZYLOPRIM) 300 MG tablet   Other Relevant Orders   Comprehensive metabolic panel (Completed)   Uric acid (Completed)    Other Visit Diagnoses    Routine general medical examination at a health care facility    -  Primary   Vaccines up to date. Screening labs checked today. Colonoscopy up to date. Continue diet and exercise. Call with any concerns. Continue to monitor.   Relevant Orders   Comprehensive metabolic panel (Completed)   CBC with Differential/Platelet (Completed)   Lipid Panel w/o Chol/HDL Ratio (Completed)   PSA (Completed)   TSH (Completed)   Urinalysis, Routine w reflex microscopic (Completed)   Uric acid (Completed)   Essential hypertension       Relevant Medications   losartan (COZAAR) 100 MG tablet   hydrochlorothiazide (HYDRODIURIL) 12.5 MG tablet   ezetimibe (ZETIA) 10 MG tablet   atorvastatin (LIPITOR) 40 MG tablet   amLODipine (NORVASC) 5 MG tablet       Discussed aspirin prophylaxis for myocardial infarction prevention and decision was made to continue ASA  LABORATORY TESTING:  Health maintenance labs ordered today as discussed above.   The natural history of prostate cancer and  ongoing controversy regarding screening and potential treatment outcomes of prostate cancer has been discussed with the patient. The meaning of a false positive PSA and a false negative PSA has been discussed. He indicates understanding of the limitations of this screening test and wishes to proceed with screening PSA testing.   IMMUNIZATIONS:   - Tdap: Tetanus vaccination status reviewed: last tetanus booster within 10 years. - Influenza: Refused - Pneumovax: Not applicable - Prevnar: Not applicable - COVID: Refused  SCREENING: - Colonoscopy: Up to date  Discussed with patient purpose of the colonoscopy is to detect colon cancer at curable precancerous or early stages   PATIENT COUNSELING:    Sexuality: Discussed sexually transmitted diseases, partner selection, use of condoms, avoidance of unintended pregnancy  and contraceptive alternatives.   Advised to avoid cigarette smoking.  I discussed with the patient that most people either abstain from alcohol or drink within safe limits (<=14/week and <=4 drinks/occasion for males, <=7/weeks and <= 3 drinks/occasion for females) and that the risk for alcohol disorders and other health effects rises proportionally with the number of drinks per week and how often a drinker exceeds daily limits.  Discussed cessation/primary prevention of drug use and availability of treatment for abuse.   Diet: Encouraged to adjust caloric intake to maintain  or achieve ideal body weight, to reduce intake of dietary saturated fat and total fat, to limit sodium intake by avoiding high sodium foods and not adding table salt, and to maintain adequate dietary potassium and calcium preferably from fresh fruits, vegetables, and low-fat dairy products.    stressed the importance of regular exercise  Injury prevention: Discussed safety belts, safety  helmets, smoke detector, smoking near bedding or upholstery.   Dental health: Discussed importance of regular tooth  brushing, flossing, and dental visits.   Follow up plan: NEXT PREVENTATIVE PHYSICAL DUE IN 1 YEAR. Return in about 6 months (around 10/30/2020).

## 2020-05-02 NOTE — Patient Instructions (Signed)

## 2020-05-03 LAB — CBC WITH DIFFERENTIAL/PLATELET
Basophils Absolute: 0.1 10*3/uL (ref 0.0–0.2)
Basos: 1 %
EOS (ABSOLUTE): 0.5 10*3/uL — ABNORMAL HIGH (ref 0.0–0.4)
Eos: 6 %
Hematocrit: 38.4 % (ref 37.5–51.0)
Hemoglobin: 13.2 g/dL (ref 13.0–17.7)
Immature Grans (Abs): 0 10*3/uL (ref 0.0–0.1)
Immature Granulocytes: 0 %
Lymphocytes Absolute: 1.9 10*3/uL (ref 0.7–3.1)
Lymphs: 25 %
MCH: 32 pg (ref 26.6–33.0)
MCHC: 34.4 g/dL (ref 31.5–35.7)
MCV: 93 fL (ref 79–97)
Monocytes Absolute: 0.9 10*3/uL (ref 0.1–0.9)
Monocytes: 11 %
Neutrophils Absolute: 4.4 10*3/uL (ref 1.4–7.0)
Neutrophils: 57 %
Platelets: 227 10*3/uL (ref 150–450)
RBC: 4.13 x10E6/uL — ABNORMAL LOW (ref 4.14–5.80)
RDW: 12.6 % (ref 11.6–15.4)
WBC: 7.8 10*3/uL (ref 3.4–10.8)

## 2020-05-03 LAB — COMPREHENSIVE METABOLIC PANEL
ALT: 18 IU/L (ref 0–44)
AST: 18 IU/L (ref 0–40)
Albumin/Globulin Ratio: 1.8 (ref 1.2–2.2)
Albumin: 4.2 g/dL (ref 3.8–4.8)
Alkaline Phosphatase: 62 IU/L (ref 44–121)
BUN/Creatinine Ratio: 28 — ABNORMAL HIGH (ref 10–24)
BUN: 24 mg/dL (ref 8–27)
Bilirubin Total: <0.2 mg/dL (ref 0.0–1.2)
CO2: 25 mmol/L (ref 20–29)
Calcium: 9.1 mg/dL (ref 8.6–10.2)
Chloride: 100 mmol/L (ref 96–106)
Creatinine, Ser: 0.87 mg/dL (ref 0.76–1.27)
GFR calc Af Amer: 107 mL/min/{1.73_m2} (ref >59)
GFR calc non Af Amer: 92 mL/min/{1.73_m2} (ref >59)
Globulin, Total: 2.4 g/dL (ref 1.5–4.5)
Glucose: 76 mg/dL (ref 65–99)
Potassium: 3.9 mmol/L (ref 3.5–5.2)
Sodium: 140 mmol/L (ref 134–144)
Total Protein: 6.6 g/dL (ref 6.0–8.5)

## 2020-05-03 LAB — LIPID PANEL W/O CHOL/HDL RATIO
Cholesterol, Total: 216 mg/dL — ABNORMAL HIGH (ref 100–199)
HDL: 55 mg/dL (ref >39)
LDL Chol Calc (NIH): 110 mg/dL — ABNORMAL HIGH (ref 0–99)
Triglycerides: 301 mg/dL — ABNORMAL HIGH (ref 0–149)
VLDL Cholesterol Cal: 51 mg/dL — ABNORMAL HIGH (ref 5–40)

## 2020-05-03 LAB — URIC ACID: Uric Acid: 4.7 mg/dL (ref 3.8–8.4)

## 2020-05-03 LAB — TSH: TSH: 1.53 u[IU]/mL (ref 0.450–4.500)

## 2020-05-03 LAB — PSA: Prostate Specific Ag, Serum: 0.4 ng/mL (ref 0.0–4.0)

## 2020-05-04 ENCOUNTER — Encounter: Payer: Self-pay | Admitting: Family Medicine

## 2020-05-05 ENCOUNTER — Encounter: Payer: Self-pay | Admitting: Family Medicine

## 2020-05-05 NOTE — Assessment & Plan Note (Signed)
Under good control on current regimen. Continue current regimen. Continue to monitor. Call with any concerns. Refills given. Labs drawn today.   

## 2020-05-17 ENCOUNTER — Ambulatory Visit: Admission: RE | Admit: 2020-05-17 | Payer: BC Managed Care – PPO | Source: Ambulatory Visit

## 2020-05-23 DIAGNOSIS — Z03818 Encounter for observation for suspected exposure to other biological agents ruled out: Secondary | ICD-10-CM | POA: Diagnosis not present

## 2020-05-23 DIAGNOSIS — Z20822 Contact with and (suspected) exposure to covid-19: Secondary | ICD-10-CM | POA: Diagnosis not present

## 2020-05-25 ENCOUNTER — Other Ambulatory Visit: Payer: Self-pay

## 2020-05-25 ENCOUNTER — Encounter: Payer: Self-pay | Admitting: Family Medicine

## 2020-05-25 ENCOUNTER — Ambulatory Visit
Admission: EM | Admit: 2020-05-25 | Discharge: 2020-05-25 | Disposition: A | Discharge status: Home / Self Care | Payer: BC Managed Care – PPO | Attending: Family Medicine | Admitting: Family Medicine

## 2020-05-25 ENCOUNTER — Ambulatory Visit: Payer: Self-pay

## 2020-05-25 ENCOUNTER — Ambulatory Visit: Payer: Self-pay | Admitting: *Deleted

## 2020-05-25 DIAGNOSIS — R42 Dizziness and giddiness: Secondary | ICD-10-CM

## 2020-05-25 DIAGNOSIS — R4702 Dysphasia: Secondary | ICD-10-CM

## 2020-05-25 HISTORY — DX: Dizziness and giddiness: R42

## 2020-05-25 LAB — POCT URINALYSIS DIP (MANUAL ENTRY)
Bilirubin, UA: NEGATIVE
Blood, UA: NEGATIVE
Glucose, UA: NEGATIVE mg/dL
Ketones, POC UA: NEGATIVE mg/dL
Leukocytes, UA: NEGATIVE
Nitrite, UA: NEGATIVE
Protein Ur, POC: NEGATIVE mg/dL
Spec Grav, UA: 1.02 (ref 1.010–1.025)
Urobilinogen, UA: 0.2 E.U./dL
pH, UA: 5 (ref 5.0–8.0)

## 2020-05-25 NOTE — Discharge Instructions (Addendum)
I am concerned of possible stroke I would recommend going to the ER for a CT scan.

## 2020-05-25 NOTE — ED Provider Notes (Signed)
Roderic Palau    CSN: [id number] Arrival date & time: 05/25/20  1344      History   Chief Complaint Chief Complaint  Patient presents with  . Dizziness    HPI Dracen Reigle Miyamoto is a 65 y.o. male.   Patient is a 65 year old male past medical history of COVID, gout, high cholesterol, hypertension, sleep apnea, obesity, vertigo.  He presents today with complaints of vertigo type feeling, trouble with his thoughts, weakness.  Reporting started Saturday when he felt like he was coming down with some sort of virus.  Had fever, headache, eyes burning, nasal congestion, cough.  Still having some nasal congestion with thick mucus.  Reports he just feels "off".  Had episode with CPAP machine in the middle of the night that woke him up.  He was concerned and felt like he was not getting good oxygen and breathing well.  After the CPAP episode is when he started having some of the more concerning "stroke like" symptoms.  Had episode where he felt like he could not get a good grip on his left hand.  This is since improved.  Had 2 - Covid test   Dizziness   Past Medical History:  Diagnosis Date  . COVID-19 12/2019  . Gout   . High cholesterol   . Hypertension   . Obesity   . Sleep apnea   . Vertigo     Patient Active Problem List   Diagnosis Date Noted  . Personal history of COVID-19 12/30/2019  . Diarrhea 05/20/2019  . Bradycardia 09/16/2017  . DDD (degenerative disc disease), lumbar 12/26/2015  . Sleep apnea 10/26/2014  . Neck arthritis 10/26/2014  . Gout 10/26/2014  . High cholesterol   . Hypertension     Past Surgical History:  Procedure Laterality Date  . knee surgeries         Home Medications    Prior to Admission medications   Medication Sig Start Date End Date Taking? Authorizing Provider  albuterol (VENTOLIN HFA) 108 (90 Base) MCG/ACT inhaler INHALE 1 TO 2 PUFFS INTO THE LUNGS EVERY 6 HOURS AS NEEDED FOR WHEEZING OR SHORTNESS OF BREATH 05/02/20  Yes  Johnson, Megan P, DO  allopurinol (ZYLOPRIM) 300 MG tablet Take 1 tablet (300 mg total) by mouth daily. 05/02/20  Yes Johnson, Megan P, DO  amLODipine (NORVASC) 5 MG tablet Take 1 tablet (5 mg total) by mouth daily. 05/02/20  Yes Johnson, Megan P, DO  atorvastatin (LIPITOR) 40 MG tablet Take 1 tablet (40 mg total) by mouth daily. 05/02/20  Yes Johnson, Megan P, DO  budesonide-formoterol (SYMBICORT) 160-4.5 MCG/ACT inhaler Inhale 2 puffs into the lungs 2 (two) times daily. 05/02/20  Yes Johnson, Megan P, DO  colchicine 0.6 MG tablet Take 1 tablet (0.6 mg total) by mouth daily as needed. 05/02/20  Yes Johnson, Megan P, DO  ezetimibe (ZETIA) 10 MG tablet Take 1 tablet (10 mg total) by mouth daily. 05/02/20  Yes Johnson, Megan P, DO  gabapentin (NEURONTIN) 300 MG capsule Take 2 capsules (600 mg total) by mouth 3 (three) times daily. 05/02/20  Yes Johnson, Megan P, DO  hydrochlorothiazide (HYDRODIURIL) 12.5 MG tablet Take 1 tablet (12.5 mg total) by mouth daily. 05/02/20  Yes Johnson, Megan P, DO  HYDROcodone-acetaminophen (NORCO/VICODIN) 5-325 MG tablet 1 po tid prn 03/29/19  Yes Johnson, Megan P, DO  loratadine (CLARITIN) 10 MG tablet Take 1 tablet (10 mg total) by mouth daily. 05/02/20  Yes Johnson, Megan P, DO  losartan (  COZAAR) 100 MG tablet Take 1 tablet (100 mg total) by mouth daily. 05/02/20  Yes Johnson, Megan P, DO  aspirin 325 MG tablet Take 325 mg by mouth daily.    [provider]  naproxen (NAPROSYN) 500 MG tablet TAKE ONE TABLET TWICE DAILY WITH A MEAL 05/02/20   Johnson, Megan P, DO  omeprazole (PRILOSEC) 20 MG capsule Take 1 capsule (20 mg total) by mouth daily. 05/02/20   Johnson, Megan P, DO  ondansetron (ZOFRAN) 4 MG tablet Take 1 tablet (4 mg total) by mouth every 8 (eight) hours as needed for nausea or vomiting. 05/20/19   Cannady, Henrine Screws T, NP  simethicone (MYLICON) 40 PO/2.4MP drops Take 0.6 mLs (40 mg total) by mouth 4 (four) times daily as needed for flatulence. 12/03/19   Johnson,  Megan P, DO  sucralfate (CARAFATE) 1 g tablet TAKE 1 TABLET BY MOUTH FOUR TIMES DAILY WITH MEALS AND AT BEDTIME 02/08/20   Johnson, Megan P, DO  tiZANidine (ZANAFLEX) 4 MG tablet TAKE 1 TABLET BY MOUTH TWICE A DAY AS NEEDED 03/23/19   [provider]    Family History Family History  Problem Relation Age of Onset  . Hyperlipidemia Father   . Hypertension Father   . Heart disease Father   . Heart attack Father 61    Social History Social History   Tobacco Use  . Smoking status: Former Smoker    Packs/day: 1.50    Years: 20.00    Pack years: 30.00    Types: Cigarettes    Quit date: 10/04/1998    Years since quitting: 21.6  . Smokeless tobacco: Current User    Types: Chew  . Tobacco comment: dip occassionally  Vaping Use  . Vaping Use: Never used  Substance Use Topics  . Alcohol use: Yes    Comment: occ  . Drug use: No    Types: Marijuana     Allergies   Patient has no known allergies.   Review of Systems Review of Systems  Neurological: Positive for dizziness.     Physical Exam Triage Vital Signs ED Triage Vitals  Enc Vitals Group     BP 05/25/20 1406 (!) 152/83     Pulse Rate 05/25/20 1406 (!) 55     Resp 05/25/20 1406 19     Temp 05/25/20 1406 97.9 F (36.6 C)     Temp Source 05/25/20 1406 Oral     SpO2 05/25/20 1406 93 %     Weight --      Height --      Head Circumference --      Peak Flow --      Pain Score 05/25/20 1409 0     Pain Loc --      Pain Edu? --      Excl. in [place]? --    No data found.  Updated Vital Signs BP (!) 152/83 (BP Location: Left Arm)   Pulse (!) 55   Temp 97.9 F (36.6 C) (Oral)   Resp 19   SpO2 93%   Visual Acuity Right Eye Distance:   Left Eye Distance:   Bilateral Distance:    Right Eye Near:   Left Eye Near:    Bilateral Near:     Physical Exam Vitals and nursing note reviewed.  Constitutional:      General: He is not in acute distress.    Appearance: Normal appearance. He is not ill-appearing,  toxic-appearing or diaphoretic.  HENT:  Head: Normocephalic and atraumatic.     Right Ear: Tympanic membrane and ear canal normal.     Left Ear: Tympanic membrane and ear canal normal.     Nose: Nose normal.     Mouth/Throat:     Pharynx: Oropharynx is clear.  Eyes:     General: No visual field deficit.    Extraocular Movements: Extraocular movements intact.     Conjunctiva/sclera: Conjunctivae normal.     Pupils: Pupils are equal, round, and reactive to light.  Cardiovascular:     Rate and Rhythm: Normal rate and regular rhythm.  Pulmonary:     Effort: Pulmonary effort is normal.     Breath sounds: Normal breath sounds.  Musculoskeletal:        General: Normal range of motion.     Cervical back: Normal range of motion.  Skin:    General: Skin is warm and dry.  Neurological:     General: No focal deficit present.     Mental Status: He is alert and oriented to person, place, and time.     Cranial Nerves: No cranial nerve deficit, dysarthria or facial asymmetry.     Sensory: No sensory deficit.     Motor: No weakness or tremor.     Gait: Gait normal.  Psychiatric:        Mood and Affect: Mood normal.      UC Treatments / Results  Labs (all labs ordered are listed, but only abnormal results are displayed) Labs Reviewed  POCT URINALYSIS DIP (MANUAL ENTRY)    EKG   Radiology No results found.  Procedures Procedures (including critical care time)  Medications Ordered in UC Medications - No data to display  Initial Impression / Assessment and Plan / UC Course  I have reviewed the triage vital signs and the nursing notes.  Pertinent labs & imaging results that were available during my care of the patient were reviewed by me and considered in my medical decision making (see chart for details).     Vertigo and dysphasia- spoke with pt about concerns for symptoms and need for CT scan. Pt agrees.  Not sure if he may be mildly hypoxic from CPAP not working  properly versus stroke versus UTI. UTI ruled out here today.  He may also have some sort of virus.  Exam with no immediate concerns. Having some dysphasia during conversion and reports feeling like he is walking on a boat.  Pt proceeding to the ER.   Final Clinical Impressions(s) / UC Diagnoses   Final diagnoses:  Vertigo  Dysphasia     Discharge Instructions     I am concerned of possible stroke I would recommend going to the ER for a CT scan.     ED Prescriptions    None     PDMP not reviewed this encounter.   Orvan July, NP 05/25/20 1503

## 2020-05-25 NOTE — ED Triage Notes (Signed)
Pt presents today with c/o of dizziness and difficulty with balance. +headache +eyes burning +nasal congestion

## 2020-05-25 NOTE — Telephone Encounter (Signed)
Pt called with complaints of "pre-stroke"; he had a high fever over the weekend, temp max 102; his head is cloudy; hw was awakened by his cpap; he had left hand weakness and wanted to give out at dinner; the pt said he feels wobbly and dizzy; his dizziness starting 05/20/20 PM; he was having weakness; he hademesis on Sunday x 3 temp 102, weakness; Emesis on 05/22/20 his temp 101.8, and weakness; he had a  negative COVID test on 05/23/20; on 05/24/20; the pt says his balance better but wobbly; he also did a  "stroke test", he slow processing but negative; pt is still having weakness and feeling "wobbly"; recommendations made per nurse triage protocol; he verbalized understanding and says he is not going to [facility]; pt instructed to proceed to his ED of choice; the pt is seen by Dr Wynetta Emery, [facility]; will route to office for notification  Reason for Disposition . Patient sounds very sick or weak to the triager  Answer Assessment - Initial Assessment Questions 1. DESCRIPTION: "Describe how you are feeling."     "head cloudy"; feels wobbly 2. SEVERITY: "How bad is it?"  "Can you stand and walk?"   - MILD - Feels weak or tired, but does not interfere with work, school or normal activities   - [place] to stand and walk; weakness interferes with work, school, or normal activities   - SEVERE - Unable to stand or walk    moderate 3. ONSET:  "When did the weakness begin?"     05/19/20 4. CAUSE: "What do you think is causing the weakness?"     Not sure; sick over weekend 5. MEDICINES: "Have you recently started a new medicine or had a change in the amount of a medicine?"      6. OTHER SYMPTOMS: "Do you have any other symptoms?" (e.g., chest pain, fever, cough, SOB, vomiting, diarrhea, bleeding, other areas of pain)     Emesis x 3 on 05/20/20; Temp over weekend 102.0 7. PREGNANCY: "Is there any chance you are pregnant?" "When was your last menstrual period?"     n/a  Protocols used: WEAKNESS  (GENERALIZED) AND FATIGUE-A-AH

## 2020-05-26 ENCOUNTER — Other Ambulatory Visit: Payer: Self-pay

## 2020-05-26 ENCOUNTER — Telehealth: Payer: Self-pay

## 2020-05-26 DIAGNOSIS — R1110 Vomiting, unspecified: Secondary | ICD-10-CM | POA: Diagnosis not present

## 2020-05-26 DIAGNOSIS — Z5321 Procedure and treatment not carried out due to patient leaving prior to being seen by health care provider: Secondary | ICD-10-CM | POA: Diagnosis not present

## 2020-05-26 DIAGNOSIS — R42 Dizziness and giddiness: Secondary | ICD-10-CM | POA: Diagnosis not present

## 2020-05-26 DIAGNOSIS — R509 Fever, unspecified: Secondary | ICD-10-CM | POA: Diagnosis not present

## 2020-05-26 LAB — BASIC METABOLIC PANEL
Anion gap: 10 (ref 5–15)
BUN: 25 mg/dL — ABNORMAL HIGH (ref 8–23)
CO2: 28 mmol/L (ref 22–32)
Calcium: 9.3 mg/dL (ref 8.9–10.3)
Chloride: 99 mmol/L (ref 98–111)
Creatinine, Ser: 1.06 mg/dL (ref 0.61–1.24)
GFR, Estimated: >60 mL/min (ref >60)
Glucose, Bld: 108 mg/dL — ABNORMAL HIGH (ref 70–99)
Potassium: 3.4 mmol/L — ABNORMAL LOW (ref 3.5–5.1)
Sodium: 137 mmol/L (ref 135–145)

## 2020-05-26 LAB — CBC
HCT: 38.6 % — ABNORMAL LOW (ref 39.0–52.0)
Hemoglobin: 13.1 g/dL (ref 13.0–17.0)
MCH: 31.6 pg (ref 26.0–34.0)
MCHC: 33.9 g/dL (ref 30.0–36.0)
MCV: 93 fL (ref 80.0–100.0)
Platelets: 261 10*3/uL (ref 150–400)
RBC: 4.15 MIL/uL — ABNORMAL LOW (ref 4.22–5.81)
RDW: 13.2 % (ref 11.5–15.5)
WBC: 10.7 10*3/uL — ABNORMAL HIGH (ref 4.0–10.5)
nRBC: 0 % (ref 0.0–0.2)

## 2020-05-26 NOTE — ED Triage Notes (Signed)
Pt presents to ER c/o dizziness x1 week.  Pt states on Saturday, he had a fever of 102.3 and was vomiting.  Pt took COVID test on Tuesday and tested negative.  Pt has been fever free since Tuesday, but has been experiencing increased dizziness.  Pt states he just feels "off" now and has felt foggy for several days.  Pt with steady gait into triage and in NAD at this time.

## 2020-05-26 NOTE — Telephone Encounter (Signed)
Patient returned my call and was notified of Dr. Durenda Age message. Patient verbalized understanding.

## 2020-05-27 ENCOUNTER — Emergency Department
Admission: EM | Admit: 2020-05-27 | Discharge: 2020-05-27 | Disposition: A | Discharge status: Home / Self Care | Payer: BC Managed Care – PPO | Attending: Emergency Medicine | Admitting: Emergency Medicine

## 2020-05-30 ENCOUNTER — Ambulatory Visit
Admission: RE | Admit: 2020-05-30 | Discharge: 2020-05-30 | Disposition: A | Discharge status: Home / Self Care | Payer: BC Managed Care – PPO | Source: Ambulatory Visit | Attending: Cardiovascular Disease | Admitting: Cardiovascular Disease

## 2020-05-30 ENCOUNTER — Other Ambulatory Visit: Payer: Self-pay

## 2020-05-30 DIAGNOSIS — E7800 Pure hypercholesterolemia, unspecified: Secondary | ICD-10-CM | POA: Insufficient documentation

## 2020-05-30 DIAGNOSIS — R001 Bradycardia, unspecified: Secondary | ICD-10-CM

## 2020-06-02 ENCOUNTER — Other Ambulatory Visit: Payer: Self-pay

## 2020-06-02 ENCOUNTER — Encounter: Payer: Self-pay | Admitting: Physician Assistant

## 2020-06-02 ENCOUNTER — Ambulatory Visit (INDEPENDENT_AMBULATORY_CARE_PROVIDER_SITE_OTHER): Payer: BC Managed Care – PPO | Admitting: Physician Assistant

## 2020-06-02 VITALS — BP 110/60 | HR 45 | Ht 68.0 in | Wt 213.0 lb

## 2020-06-02 DIAGNOSIS — R0789 Other chest pain: Secondary | ICD-10-CM | POA: Diagnosis not present

## 2020-06-02 DIAGNOSIS — Z87898 Personal history of other specified conditions: Secondary | ICD-10-CM

## 2020-06-02 DIAGNOSIS — G4733 Obstructive sleep apnea (adult) (pediatric): Secondary | ICD-10-CM | POA: Diagnosis not present

## 2020-06-02 DIAGNOSIS — E7800 Pure hypercholesterolemia, unspecified: Secondary | ICD-10-CM | POA: Diagnosis not present

## 2020-06-02 DIAGNOSIS — E785 Hyperlipidemia, unspecified: Secondary | ICD-10-CM

## 2020-06-02 DIAGNOSIS — I700 Atherosclerosis of aorta: Secondary | ICD-10-CM

## 2020-06-02 DIAGNOSIS — Z9989 Dependence on other enabling machines and devices: Secondary | ICD-10-CM

## 2020-06-02 DIAGNOSIS — Z87891 Personal history of nicotine dependence: Secondary | ICD-10-CM

## 2020-06-02 DIAGNOSIS — R001 Bradycardia, unspecified: Secondary | ICD-10-CM | POA: Diagnosis not present

## 2020-06-02 DIAGNOSIS — R931 Abnormal findings on diagnostic imaging of heart and coronary circulation: Secondary | ICD-10-CM

## 2020-06-02 DIAGNOSIS — Z8616 Personal history of COVID-19: Secondary | ICD-10-CM | POA: Diagnosis not present

## 2020-06-02 DIAGNOSIS — I38 Endocarditis, valve unspecified: Secondary | ICD-10-CM

## 2020-06-02 DIAGNOSIS — I10 Essential (primary) hypertension: Secondary | ICD-10-CM

## 2020-06-02 MED ORDER — ICOSAPENT ETHYL 1 G PO CAPS: 2.0000 g | ORAL_CAPSULE | Freq: Two times a day (BID) | ORAL | 5 refills | Status: DC

## 2020-06-02 MED ORDER — ATORVASTATIN CALCIUM 80 MG PO TABS: 80.0000 mg | ORAL_TABLET | Freq: Every day | ORAL | 5 refills | Status: DC

## 2020-06-02 MED ORDER — AMLODIPINE BESYLATE 2.5 MG PO TABS: 2.5000 mg | ORAL_TABLET | Freq: Every day | ORAL | 5 refills | Status: DC

## 2020-06-02 NOTE — Progress Notes (Signed)
Office Visit    Patient Name: Daniel Cook [facility] Date of Encounter: 06/02/2020  PCP:  Valerie Roys DO   [place] Cardiologist:  Ida Rogue, MD  Advanced Practice Provider:  No care team member to display Electrophysiologist:  None   Chief Complaint    Chief Complaint  Patient presents with  . Other    Follow up post testing - Meds reviewed verbally with patient.     65 year old male with history of hypertension, hyperlipidemia, asymptomatic sinus bradycardia, previous dizziness/history of dizziness resolving with medication changes, trivial AR/MR/TR/PR (2019 echo), aortic atherosclerosis by recent 05/2020 CT, CAC score 185 x 05/2020 CT, sleep apnea on CPAP, prior COVID-19 infection 02/2020, prior tobacco use, gout, and here today for follow-up after recent CAC score.  Past Medical History    Past Medical History:  Diagnosis Date  . COVID-19 12/2019  . Gout   . High cholesterol   . Hypertension   . Obesity   . Sleep apnea   . Vertigo    Past Surgical History:  Procedure Laterality Date  . knee surgeries      Allergies  No Known Allergies  History of Present Illness    Daniel Cook is a 65 y.o. male with PMH as above.  He has history of hypertension, hyperlipidemia, sleep apnea on CPAP, and prior tobacco use.  He was referred by Dr. Park Liter for bradycardia with first visit 08/16/2019 with primary cardiologist, Dr. Rockey Situ.  He was previously seen at [facility] clinic.  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    10/2017 echo with normal LV SF, mild LVH, trivial AR, MR, TR, PR.   When last seen in clinic 08/2019, he was doing well from a cardiac standpoint.  He was bradycardic and asymptomatic.  He was following a weight loss program with weight down 12 pounds through dietary changes.  He denied any anginal symptoms or exercise intolerance.  He did report of a  "nag" in his left chest and wondered if it he should have additional work-up.   Given a history of lightheaded spells through the summer, he would hold his HCTZ sometimes or take this every other day.  It was noted that if symptoms of lightheadedness persisted, he could cut his amlodipine dose in half. He was not checking his BP at home on a routine basis.  Recommendations were for CT coronary artery calcium score for risk factor stratification.  He was continued on his current medications.    On Friday 05/25/20, he presented to the emergency department with fatigue, recent fever, elevated BP, emesis, disorientation/confusion, and temporary issues with left hand grip.  He reported dizziness x1 week.  He was very ill over Super Bowl weekend and actually missed the game.  BP 137/74.  Temperature is 102.3 and 101.8.  Lowest temperature was 99.5.  He was very fatigued.  Per review of ED note, he also felt as if his eyes were burning and reported headache, nasal congestion, and cough with thick mucus.  Today, he reported that he felt similar over Super Bowl weekend to when he had Covid September 2021.  On Sunday night, he had an episode of emesis.  On Wednesday, he felt disoriented and dizzy with trouble concentrating.  He also reported and episode where he woke up in the middle the night and felt as if he was not breathing well.  Per ED notes, he also reported strokelike symptoms and could not get a good grip  of his left hand.  This had since improved and not recurred.  Per ED notes, he had taken a COVID-19 home test with results negative.  Today, he reports he also went through a drive-through [id number] testing area on Tuesday with results negative.  Per ED notes, his fever resolved on Tuesday.  On Friday, considering his ongoing symptoms, he presented to the ED.  Work-up was unrevealing with recommendation to follow-up as an outpatient with cardiology.  Since that time, he has reported improvement in symptoms.  On 05/30/2020, coronary artery calcium score was obtained and showed a coronary  artery calcium score of 185, placing him in the 73rd percentile for age and sex max control.  ASA and statin were recommended if no contraindications.  Patchy areas of groundglass were seen in the lungs bilaterally.  This was suggestive of resolving multilobar pneumonia.  Recommendation was for outpatient referral to pulmonology in 3 to 6 months to assess.  Follow-up high-resolution CT recommended for reassessment.  Aortic atherosclerosis was also noted.  Today, 06/02/2020, he reports that he is concerned regarding his previous symptoms as outlined above.  He recalls his 2/17 trip to the emergency department as outlined above.  Coronary artery calcium score reviewed.  Today, he denies any chest pain, racing heart rate, palpitations, presyncope, or syncope.  He is reportedly asymptomatic with bradycardic rates and EKG today 42 bpm with patient report that he is not dizzy at this current time.  No current shortness of breath or dyspnea.  No signs or symptoms of volume overload and no further report of waking up short of breath in the middle the night.  He reports trying to make appropriate lifestyle changes with healthy eating and increasing activity.  He reports a rare Dr. Malachi Bonds.  No signs or symptoms of bleeding.  We reviewed his recent CT images in detail, including the recommendation for follow-up with pulmonology.  We reviewed his most recent labs.  We discussed risk factor modification.  Home Medications    Current Outpatient Medications on File Prior to Visit  Medication Sig Dispense Refill  . albuterol (VENTOLIN HFA) 108 (90 Base) MCG/ACT inhaler INHALE 1 TO 2 PUFFS INTO THE LUNGS EVERY 6 HOURS AS NEEDED FOR WHEEZING OR SHORTNESS OF BREATH 18 g 1  . allopurinol (ZYLOPRIM) 300 MG tablet Take 1 tablet (300 mg total) by mouth daily. 90 tablet 1  . amLODipine (NORVASC) 5 MG tablet Take 1 tablet (5 mg total) by mouth daily. 90 tablet 1  . aspirin 325 MG tablet Take 325 mg by mouth daily.    Marland Kitchen  atorvastatin (LIPITOR) 40 MG tablet Take 1 tablet (40 mg total) by mouth daily. 90 tablet 1  . budesonide-formoterol (SYMBICORT) 160-4.5 MCG/ACT inhaler Inhale 2 puffs into the lungs 2 (two) times daily. 3 each 3  . colchicine 0.6 MG tablet Take 1 tablet (0.6 mg total) by mouth daily as needed. 90 tablet 1  . ezetimibe (ZETIA) 10 MG tablet Take 1 tablet (10 mg total) by mouth daily. 90 tablet 1  . gabapentin (NEURONTIN) 300 MG capsule Take 2 capsules (600 mg total) by mouth 3 (three) times daily. 540 capsule 1  . hydrochlorothiazide (HYDRODIURIL) 12.5 MG tablet Take 1 tablet (12.5 mg total) by mouth daily. 90 tablet 1  . HYDROcodone-acetaminophen (NORCO/VICODIN) 5-325 MG tablet 1 po tid prn 15 tablet 0  . loratadine (CLARITIN) 10 MG tablet Take 1 tablet (10 mg total) by mouth daily. 90 tablet 0  . losartan (COZAAR) 100 MG  tablet Take 1 tablet (100 mg total) by mouth daily. 90 tablet 1  . naproxen (NAPROSYN) 500 MG tablet TAKE ONE TABLET TWICE DAILY WITH A MEAL 180 tablet 1  . omeprazole (PRILOSEC) 20 MG capsule Take 1 capsule (20 mg total) by mouth daily. 90 capsule 1  . ondansetron (ZOFRAN) 4 MG tablet Take 1 tablet (4 mg total) by mouth every 8 (eight) hours as needed for nausea or vomiting. 30 tablet 0  . simethicone (MYLICON) 40 ZH/0.8MV drops Take 0.6 mLs (40 mg total) by mouth 4 (four) times daily as needed for flatulence. 120 mL 1  . sucralfate (CARAFATE) 1 g tablet TAKE 1 TABLET BY MOUTH FOUR TIMES DAILY WITH MEALS AND AT BEDTIME 120 tablet 1  . tiZANidine (ZANAFLEX) 4 MG tablet Take 4 mg by mouth as needed.     No current facility-administered medications on file prior to visit.    Review of Systems    He denies current chest pain but does report a history of nagging feeling in his chest as outlined in previous visits.  He denies palpitations, orthopnea, n, syncope, edema, weight gain, or early satiety.  He reports one episode of emesis over Super Bowl weekend.  He reports one episode  of PND over Super Bowl weekend/associated with this illness.  He reports previous dizziness associated with his illness and in the past as outlined in previous notes but not current.  He denies current shortness of breath at rest.  No signs or symptoms of bleeding.  He reports improvement in congestion/illness symptoms since his presentation to the ED 2/17.   All other systems reviewed and are otherwise negative except as noted above.  Physical Exam    VS:  BP 110/60 (BP Location: Left Arm, Patient Position: Sitting, Cuff Size: Normal)   Pulse (!) 45   Ht 5\' 8"  (1.727 m)   Wt 213 lb (96.6 kg)   SpO2 97%   BMI 32.39 kg/m  , BMI Body mass index is 32.39 kg/m. GEN: Well nourished, well developed, in no acute distress. HEENT: normal. Neck: Supple, no JVD, carotid bruits, or masses. Cardiac: Bradycardic but regular, no murmurs, rubs, or gallops. No clubbing, cyanosis, pitting edema edema.  Radials/DP/PT 2+ and equal bilaterally.  Respiratory:  Respirations regular and unlabored, clear to auscultation bilaterally. GI: Soft, nontender, nondistended, BS + x 4. MS: no deformity or atrophy. Skin: warm and dry, no rash. Neuro:  Strength and sensation are intact. Psych: Normal affect.  Accessory Clinical Findings    ECG personally reviewed by me today -marked sinus bradycardia at 42 bpm, PR interval 176 ms, QRS 80 ms, QTC 354 ms, poor R wave/conduction in leads III,  no acute ST/T changes- no acute changes.  VITALS Reviewed today   Temp Readings from Last 3 Encounters:  05/26/20 98.9 F (37.2 C) (Oral)  05/25/20 97.9 F (36.6 C) (Oral)  05/02/20 98.9 F (37.2 C) (Oral)   BP Readings from Last 3 Encounters:  06/02/20 110/60  05/26/20 (!) 163/76  05/25/20 (!) 152/83   Pulse Readings from Last 3 Encounters:  06/02/20 (!) 45  05/26/20 (!) 54  05/25/20 (!) 55    Wt Readings from Last 3 Encounters:  06/02/20 213 lb (96.6 kg)  05/26/20 207 lb (93.9 kg)  05/02/20 218 lb (98.9 kg)      LABS  reviewed today   Lab Results  Component Value Date   WBC 10.7 (H) 05/26/2020   HGB 13.1 05/26/2020   HCT 38.6 (L)  05/26/2020   MCV 93.0 05/26/2020   PLT 261 05/26/2020   Lab Results  Component Value Date   CREATININE 1.06 05/26/2020   BUN 25 (H) 05/26/2020   NA 137 05/26/2020   K 3.4 (L) 05/26/2020   CL 99 05/26/2020   CO2 28 05/26/2020   Lab Results  Component Value Date   ALT 18 05/02/2020   AST 18 05/02/2020   ALKPHOS 62 05/02/2020   BILITOT <0.2 05/02/2020   Lab Results  Component Value Date   CHOL 216 (H) 05/02/2020   HDL 55 05/02/2020   LDLCALC 110 (H) 05/02/2020   TRIG 301 (H) 05/02/2020   CHOLHDL 3.3 03/03/2018    Lab Results  Component Value Date   HGBA1C 6.2 05/23/2015   Lab Results  Component Value Date   TSH 1.530 05/02/2020     STUDIES/PROCEDURES reviewed today   CT cardiac scoring 05/30/2020 FINDINGS: Non-cardiac: See separate report from [facility]. Ascending Aorta: Normal size Pericardium: Normal Coronary arteries: Normal origin of left and right coronary arteries. Distribution of arterial calcifications if present, as noted below; LM 0 LAD 85 LCx 4.23 RCA 95.5 Total 185 IMPRESSION: 1. Coronary calcium score of 185 . This was 73rd percentile for age and sex matched control. 2.  Recommend asa and statin if no contraindications IMPRESSION: 1. The appearance of the lungs suggests resolving multilobar bilateral pneumonia, likely with peripheral areas of post infectious or inflammatory scarring. Outpatient referral to pulmonology for further clinical evaluation is recommended. In 3-6 months to assess follow-up high-resolution chest CT is recommended for temporal changes in the appearance of the lung parenchyma. 2.  Aortic Atherosclerosis (ICD10-I70.0).   Assessment & Plan    Asymptomatic sinus bradycardia --Asymptomatic bradycardia with rate today 45 bpm.  Previous EKGs show sinus bradycardia as well.  Denies  any presyncope or syncope with lower HR.  No evidence of high-grade heart block on EKG or on review of EMR/HPI.  Given bradycardic rate, avoid BB/AV nodal blocking agents.  Decreased to amlodipine 2.5 mg daily.  He will monitor his vitals at home.  Ordered 2-week Zio XT for further cardiac ambulatory monitoring and to determine minimum heart rate and to further evaluate if any evidence of high-grade block or arrhythmia.  This will also allow [place] to rule out dizziness due to arrhythmia or ectopy.  In addition, we will update an echo.  Previous 2019 echo showed normal EF.  We will check labs today, including BMET/magnesium to reassess electrolytes.  Check TSH/thyroid function.  Previous TSH from 04/2020 was 1.530 and WNL.  Further recommendations pending labs and work-up.  He will call the office if any dizziness or syncope.   Atypical chest pain  CAC score 118 (05/2020) --No current chest pain.  Previous report of a a nagging feeling in his chest at his recent [place] visit, at which point cardiac CT was ordered.  Today, he describes this nagging as ongoing and as an occasional feeling without clear exacerbating or alleviating factors.  No associated symptoms.  2019 echo with normal LV SF and trivial AR, MR, TR, PR.  Cardiac CT performed 05/2020 with coronary calcium score of 185, placing him in the 73rd percentile for age and sex matched control.  Risk factors for CAD include hyperlipidemia with LDL above goal, known CAC/aortic atherosclerosis, hypertension, prior history of smoking.  Risk factor modification recommended.  Recommend medical management.  Discussed recommendation for ASA 81 mg daily.  Medication list has ASA 325 mg daily with patient  report that he will go home and check the exact milligram of his current ASA and reduce to 81 mg daily if needed to prevent bleeding.  Continue Prilosec.  Will check CBC.  Statin dose increased today for goal LDL below 70.  Continue atorvastatin 80 mg daily with  Zetia 10 mg daily.  Continue losartan 100 mg daily.  Will check A1c for further risk factor stratification.  Avoid beta-blocker/AV nodal blocking agents given sinus bradycardia.  Decreased his amlodipine to 2.5 mg daily.  Encouraged ongoing dietary and lifestyle changes.  Ongoing weight loss encouraged.  Encouraged increased activity.  Dizziness/vertigo --No current dizziness or vertigo.  He reports a long history of vertigo, which had improved with medication changes.  He had recurrent dizziness with his most recent illness over Super Bowl weekend.  He wonders the extent to which dehydration contributes to his dizziness.  Today, he has a bradycardic rate with soft BP.  Decreased to amlodipine 2.5 mg daily.  Avoid AV nodal blocking agents.  Recommend Zio XT x2 weeks ordered today for further evaluation of his rate and rhythm given bradycardia and dizziness.  Update 2019 echo to reassess EF, valves, heart pressures, and rule out any wall motion abnormalities or acute structural changes.  Previous echo showed normal EF with mild AR, MR, TR, PR.  Considered were bilateral carotid studies given dizziness; however, no bruit heard on exam and no report of amaurosis fugax.  We will defer carotids and reassess indication for carotid ultrasounds at RTC and if work-up as above unrevealing.  Labs-TSH, BMET, magnesium, and CBC.  Reviewed signs and symptoms of bleeding.  If potassium remains low, recommend discontinue HCTZ +/-Spironolactone as directly below.  Monitor BP at home.  Call the office of hypotension.  H/o hypokalemia --2/18 labs show potassium 3.4.  Goal potassium 4.0.  Repeat BMET today to reassess potassium.  Check magnesium.  Replete electrolytes to avoid arrhythmia.  Consider abnormal electrolytes as contributing to his symptoms described above, including his dizziness.  If K+ still below 4.0, recommend discontinue HCTZ and start spironolactone in its place.  Further recommendations pending results of  labs.   Valvular disease, mild --No current dizziness.  2019 echo with mild AR, MR, TR, PR.  No significant murmurs on cardiac exam today.  He reports dizziness in the setting of his recent illness and as described in HPI above.  Since recovering from his illness over Super Bowl weekend, no further dizziness.  Aortic atherosclerosis (CT scan 2/22) --Aortic atherosclerosis seen on CT.  Continue ASA and statin.  Risk factor modification.  Essential hypertension, goal BP 130/80 or lower --BP well controlled to soft today.  No further episodes of dizziness since the ED.  He wonders the extent to which dehydration may contribute to lower pressures and dizziness.  Hydration encouraged.  He continues on HCTZ with recommendation to discontinue if potassium remains low on repeat BMET.  If potassium remains low and HCTZ discontinued, spironolactone can be considered given it is potassium sparing diuretic.  Reassess following repeat BMET. Amlodipine decreased to 2.5 mg daily.  Continue losartan 100 mg daily.  He will monitor his BP at home.  Hyperlipidemia, LDL goal below 70 Hypertriglyceridemia -04/2020 cholesterol 216 with triglycerides 301 and LDL 110.  Recommend increased dose statin to atorvastatin 80 mg daily today, given most recent LDL not at goal of below 70.  Continue with Zetia 10 mg daily.  Most recent 04/2020 LFTs WNL.  Repeat lipid and liver function in 6 to  8 weeks.  If LDL still not below 70, consider Praluent at that time.  Started on Vascepa 1 g twice daily for hypertriglyceridemia given triglycerides of 301.  Reassess triglycerides at repeat labs in 6 to 8 weeks.  Discussed PCSK9 inhibitors and lipid clinic today-we will defer at this time with preference to attempt the above medication changes for control of LDL and TG.  Obstructive sleep apnea on CPAP --Reports CPAP compliance.  Ongoing compliance encouraged.  CT post infectious or inflammatory scarring (CT scan, 05/2020) -Outpatient  referral to pulmonology provided for further clinical evaluation as outlined in his CT report.  CT report also specifies repeat CT in 3 to 6 months for temporal changes.  Consider this is contributing to his symptoms.  Referral provided to pulmonology today.  Continue current albuterol.  History of COVID-19 infection --Reports 02/2020 COVID-19 infection.  Consider as contributing to pulmonary findings on CT, as well as his most recent symptoms.  Referral provided to pulmonology as indicated in his CT report.  Recent febrile illness Leukocytosis --Denies further fever.  Febrile illness over Super Bowl weekend as above.  2/18 labs show leukocytosis with WBC 10.7.Marland Kitchen  Follow-up per PCP.  Former smoker -No current tobacco use.  Ongoing smoking cessation encouraged.  Medication management --Current medication list reports that he takes 325 mg of aspirin daily.  Recommendation was for 81 mg of aspirin daily unless recommended by neurologist and to prevent the risk of bleeding.  He will go home and check to see his dose of ASA and decrease to 81 mg daily if needed.  Reviewed the signs and symptoms of bleeding with patient understanding.  He denies any current signs or symptoms of bleeding.  Continue omeprazole.  Medication changes: Start Vascepa 1 g twice daily.  Decrease to amlodipine 2.5 mg daily.  Increase to atorvastatin 80 mg daily. Labs ordered: BMET, magnesium, CBC, A1c, TSH.  Lipids and liver function in 6 to 8 weeks. Studies / Imaging ordered: Echo, Zio XT x2 weeks Future considerations: Carotids.  If EF low, further ischemic work-up.  If Zio indicates, EP referral.  If potassium remains low, discontinue HCTZ.  In its place, could consider spironolactone if BP support needed after HCTZ continued. Disposition: RTC 4 weeks    Arvil Chaco, PA-C 06/02/2020

## 2020-06-02 NOTE — Patient Instructions (Addendum)
Medication Instructions:  Your physician has recommended you make the following change in your medication:   1) START Vascepa 1G - Take 2 capsules TWICE daily  2) DECREASE Amlodipine 2.5mg  DAILY - A new Rx has been sent to your pharmacy, you MAY use up current dose by cutting in half taking half tablet daily.  3) INCREASE Atorvastatin to 80mg  DAILY - A new Rx has been sent to your pharmacy  *If you need a refill on your cardiac medications before your next appointment, please call your pharmacy*   Lab Work:  1) Your physician recommends that you have lab work TODAY: CBC, Bmet, Magnesium, A1c  2) Your physician recommends that you return for FASTING lab work in 6-8 weeks at the [place]: Lipid and Liver panel -  Please go to the [facility]. You will check in at the front desk to the right as you walk into the atrium. Valet Parking is offered if needed. - No appointment needed. You may go any day between 7 am and 6 pm.   Testing/Procedures:  1)  Your physician has requested that you have an echocardiogram.  Echocardiography is a painless test that uses sound waves to create images of your heart. It provides your doctor with information about the size and shape of your heart and how well your heart's chambers and valves are working. This procedure takes approximately one hour. There are no restrictions for this procedure.  2) Your physician has recommended that you wear a Zio monitor. (This will be placed on the same day as your Echo so that you may have Echo completed prior to placement). This monitor is a medical device that records the heart's electrical activity. Doctors most often use these monitors to diagnose arrhythmias. Arrhythmias are problems with the speed or rhythm of the heartbeat. The monitor is a small device applied to your chest. You can wear one while you do your normal daily activities. While wearing this monitor if you have any symptoms to push the button  and record what you felt. Once you have worn this monitor for the period of time provider prescribed (Usually 14 days), you will return the monitor device in the postage paid box. Once it is returned they will download the data collected and provide [place] with a report which the provider will then review and we will call you with those results. Important tips:  1. Avoid showering during the first 24 hours of wearing the monitor. 2. Avoid excessive sweating to help maximize wear time. 3. Do not submerge the device, no hot tubs, and no swimming pools. 4. Keep any lotions or oils away from the patch. 5. After 24 hours you may shower with the patch on. Take brief showers with your back facing the shower head.  6. Do not remove patch once it has been placed because that will interrupt data and decrease adhesive wear time. 7. Push the button when you have any symptoms and write down what you were feeling. 8. Once you have completed wearing your monitor, remove and place into box which has postage paid and place in your outgoing mailbox.  9. If for some reason you have misplaced your box then call our office and we can provide another box and/or mail it off for you.        Follow-Up: At [facility], you and your health needs are our priority.  As part of our continuing mission to provide you with exceptional heart care,  we have created designated Provider Care Teams.  These Care Teams include your primary Cardiologist (physician) and Advanced Practice Providers (APPs -  Physician Assistants and Nurse Practitioners) who all work together to provide you with the care you need, when you need it.  We recommend signing up for the patient portal called "MyChart".  Sign up information is provided on this After Visit Summary.  MyChart is used to connect with patients for Virtual Visits (Telemedicine).  Patients are able to view lab/test results, encounter notes, upcoming appointments, etc.  Non-urgent messages  can be sent to your provider as well.   To learn more about what you can do with MyChart, go to [email or web address].    Your next appointment:   4 week(s)  The format for your next appointment:   In Person  Provider:   You may see Ida Rogue, MD or one of the following Advanced Practice Providers on your designated Care Team:    Murray Hodgkins, NP  Christell Faith, PA-C  Marrianne Mood, PA-C  Cadence [place], [place]  Laurann Montana, NP   Other Instructions  You have been referred to Pulmonology - you will receive a call to schedule this appointment. Phone number listed below.    Make sure you are taking only 81mg  of asprin daily. Please call our office to let us know [phone].

## 2020-06-03 LAB — BASIC METABOLIC PANEL
BUN/Creatinine Ratio: 21 (ref 10–24)
BUN: 20 mg/dL (ref 8–27)
CO2: 22 mmol/L (ref 20–29)
Calcium: 9.4 mg/dL (ref 8.6–10.2)
Chloride: 102 mmol/L (ref 96–106)
Creatinine, Ser: 0.95 mg/dL (ref 0.76–1.27)
GFR calc Af Amer: 99 mL/min/{1.73_m2} (ref >59)
GFR calc non Af Amer: 85 mL/min/{1.73_m2} (ref >59)
Glucose: 119 mg/dL — ABNORMAL HIGH (ref 65–99)
Potassium: 5.1 mmol/L (ref 3.5–5.2)
Sodium: 140 mmol/L (ref 134–144)

## 2020-06-03 LAB — TSH: TSH: 1.56 u[IU]/mL (ref 0.450–4.500)

## 2020-06-03 LAB — HEMOGLOBIN A1C
Est. average glucose Bld gHb Est-mCnc: 151 mg/dL
Hgb A1c MFr Bld: 6.9 % — ABNORMAL HIGH (ref 4.8–5.6)

## 2020-06-03 LAB — CBC
Hematocrit: 40.1 % (ref 37.5–51.0)
Hemoglobin: 13.2 g/dL (ref 13.0–17.7)
MCH: 30.9 pg (ref 26.6–33.0)
MCHC: 32.9 g/dL (ref 31.5–35.7)
MCV: 94 fL (ref 79–97)
Platelets: 403 10*3/uL (ref 150–450)
RBC: 4.27 x10E6/uL (ref 4.14–5.80)
RDW: 13.4 % (ref 11.6–15.4)
WBC: 9.2 10*3/uL (ref 3.4–10.8)

## 2020-06-03 LAB — MAGNESIUM: Magnesium: 2 mg/dL (ref 1.6–2.3)

## 2020-06-05 ENCOUNTER — Telehealth: Payer: Self-pay

## 2020-06-05 NOTE — Addendum Note (Signed)
Addended by: Ernie Hew D on: 06/05/2020 01:45 PM   Modules accepted: Orders

## 2020-06-05 NOTE — Telephone Encounter (Signed)
Able to reach pt regarding his recent  CT calicum score, Dr. Rockey Situ had a chance to review his results and advised  "CT coronary calcium score  Score is mildly elevated 185  Continue asa and lipitor  Lung findings as below  Does he have shortness of breath, cough, etc  Need to see a pulmonary doctor?" Daniel Cook is pleased with results, will continue his 80 mg Lipitor and ASA daily, as for his lungs, reports no shob or cough, but has an appt already with pulmonary on 3/14, he will have them review his scan for any concerns, otherwise all questions or concerns were address and no additional concerns at this time, will call back for anything further.

## 2020-06-06 ENCOUNTER — Telehealth: Payer: Self-pay | Admitting: *Deleted

## 2020-06-06 DIAGNOSIS — E875 Hyperkalemia: Secondary | ICD-10-CM

## 2020-06-06 NOTE — Telephone Encounter (Signed)
Agree with recommendation to stop OTC potassium supplement. I have [place] Dr. Wynetta Emery to ask if a repeat BMP could be collected at her office on Friday 06/10/19 during his clinic visit there. Otherwise, recommend repeat BMP at the [facility] on Thursday or Friday of this week (06/08/20 or 06/09/20)  Loel Dubonnet, NP

## 2020-06-06 NOTE — Telephone Encounter (Signed)
Spoke to Daniel Cook, notified of lab results and provider's recc.  Daniel Cook DOES confirm that he has been taking a Potassium supplement that he did not report at clinic visit.  States he takes for his muscles, otc Potassium 99mg  daily.  Daniel Cook also takes otc Magnesium, Fish oil, Vitamin C, Vit D, Zinc, Vit E and Glucosamine Chondroitin that he did not report - I have updated Daniel Cook's med list with supplements Daniel Cook is taking.   Advised Daniel Cook stop taking Potassium and will notify provider for further recc.  Daniel Cook verbalized understanding.  Daniel Cook does have follow up with PCP this Friday. He will discuss platelets and Hgb A1C. Please advise.

## 2020-06-06 NOTE — Telephone Encounter (Signed)
-----   Message from Arvil Chaco, PA-C sent at 06/04/2020 11:13 PM EST ----- Labs show --Stable renal function. --Potassium borderline high. --Magnesium at goal. --Blood counts stable. Platelets jumped with recommendation to trend with PCP. --Hgb A1C elevated with recommendation to follow-up with PCP. --Thyroid lab normal.  Recommendations: --Repeat BMET to confirm potassium level, as this could be due to lab error.  --Ensure he is not taking any OTC supplements with potassium. --Follow-up with PCP (Cc'd to result note) regarding his platelets and Hgb A1C.

## 2020-06-07 NOTE — Telephone Encounter (Signed)
Called Dr. Durenda Age office to verify if able to draw Bmet this Friday. Otherwise, will contact pt to have repeat [facility] today or [facility].

## 2020-06-08 NOTE — Telephone Encounter (Signed)
Noted  

## 2020-06-08 NOTE — Telephone Encounter (Signed)
I spoke to pt. Notified if for any reason he is unable to have labs repeated at PCP tomorrow, then I have placed orders for him to have at the [facility] tomorrow. Pt verbalized understanding.

## 2020-06-09 ENCOUNTER — Ambulatory Visit: Payer: BC Managed Care – PPO | Admitting: Family Medicine

## 2020-06-09 ENCOUNTER — Encounter: Payer: Self-pay | Admitting: Family Medicine

## 2020-06-09 ENCOUNTER — Telehealth: Payer: Self-pay | Admitting: Physician Assistant

## 2020-06-09 ENCOUNTER — Other Ambulatory Visit: Payer: Self-pay

## 2020-06-09 VITALS — BP 130/75 | HR 50 | Temp 97.8°F | Wt 209.0 lb

## 2020-06-09 DIAGNOSIS — D72829 Elevated white blood cell count, unspecified: Secondary | ICD-10-CM | POA: Diagnosis not present

## 2020-06-09 DIAGNOSIS — E118 Type 2 diabetes mellitus with unspecified complications: Secondary | ICD-10-CM

## 2020-06-09 DIAGNOSIS — I10 Essential (primary) hypertension: Secondary | ICD-10-CM

## 2020-06-09 DIAGNOSIS — E876 Hypokalemia: Secondary | ICD-10-CM | POA: Diagnosis not present

## 2020-06-09 DIAGNOSIS — G4730 Sleep apnea, unspecified: Secondary | ICD-10-CM

## 2020-06-09 DIAGNOSIS — J189 Pneumonia, unspecified organism: Secondary | ICD-10-CM

## 2020-06-09 DIAGNOSIS — I700 Atherosclerosis of aorta: Secondary | ICD-10-CM | POA: Diagnosis not present

## 2020-06-09 MED ORDER — DOXYCYCLINE HYCLATE 100 MG PO TABS: 100.0000 mg | ORAL_TABLET | Freq: Two times a day (BID) | ORAL | 0 refills | Status: DC

## 2020-06-09 MED ORDER — ICOSAPENT ETHYL 1 G PO CAPS: 2.0000 g | ORAL_CAPSULE | Freq: Two times a day (BID) | ORAL | 3 refills | Status: DC

## 2020-06-09 NOTE — Progress Notes (Signed)
BP 130/75   Pulse (!) 50   Temp 97.8 F (36.6 C)   Wt 209 lb (94.8 kg)   SpO2 97%   BMI 31.78 kg/m    Subjective:    Patient ID: Daniel Cook, male    DOB: September 23, 1955, 65 y.o.   MRN: [id number]  HPI: Daniel Cook is a 65 y.o. male  Chief Complaint  Patient presents with  . Dizziness    Patient states he went to urgent care and ED to get worked up, patient states he was dizzy and vomitting. Has upcoming pulmonary appointment and cardiology appointment    Patient had febrile illness, emesis and confusion on 05/26/19. He also had L hand weakness, dizziness and visual changes on 05/25/20. He was advised to go to the ER, and did not at that time. He went to an urgent care, where he was also advised to to the ER for stroke evaluation, and did not go. This has since resolved entirely and has not reccured.   He saw his cardiologist 1 week ago after having his cardiac calcium score CT done. He had ground glass opacities bilaterally- thought to be due to resolving multi-lobar pneumonia. Needs follow up 3-6 months to confirm resolution. They decreased his amlodipine to 2.42m daily and got him set up for a holter monitor and an ECHO.    HYPERTENSION Hypertension status: controlled  Satisfied with current treatment? yes Duration of hypertension: chronic BP monitoring frequency:  not checking BP range:  BP medication side effects:  no Medication compliance: excellent compliance Aspirin: yes Recurrent headaches: no Visual changes: no Palpitations: no Dyspnea: no Chest pain: no Lower extremity edema: no Dizzy/lightheaded: no   DIABETES- newly diagnosed Hypoglycemic episodes:no Polydipsia/polyuria: no Visual disturbance: no Chest pain: no Paresthesias: no Glucose Monitoring: no  Accucheck frequency: Not Checking Taking Insulin?: no Blood Pressure Monitoring: a few times a month Retinal Examination: Not up to Date Foot Exam: Not up to Date Diabetic Education:  Completed Pneumovax: Not up to Date Influenza: Up to Date  SLEEP APNEA- his CPAP broke, and he needs to get a new one Sleep apnea status: controlled Duration: chronic Satisfied with current treatment?:  yes CPAP use:  yes Sleep quality with CPAP use: excellent Treament compliance:excellent compliance Last sleep study: 2017 Treatments attempted: CPAP Wakes feeling refreshed:  yes Daytime hypersomnolence:  no Fatigue:  no Insomnia:  no Good sleep hygiene:  no Difficulty falling asleep:  no Difficulty staying asleep:  no Snoring bothers bed partner:  no Observed apnea by bed partner: no Obesity:  yes Hypertension: yes  Pulmonary hypertension:  no Coronary artery disease:  yes   Relevant past medical, surgical, family and social history reviewed and updated as indicated. Interim medical history since our last visit reviewed. Allergies and medications reviewed and updated.  Review of Systems  Constitutional: Negative.   HENT: Positive for congestion. Negative for dental problem, drooling, ear discharge, ear pain, facial swelling, hearing loss, mouth sores, nosebleeds, postnasal drip, rhinorrhea, sinus pressure, sinus pain, sneezing, sore throat, tinnitus, trouble swallowing and voice change.   Respiratory: Positive for cough. Negative for apnea, choking, chest tightness, shortness of breath, wheezing and stridor.   Cardiovascular: Negative.   Gastrointestinal: Negative.   Psychiatric/Behavioral: Negative.     Per HPI unless specifically indicated above     Objective:    BP 130/75   Pulse (!) 50   Temp 97.8 F (36.6 C)   Wt 209 lb (94.8 kg)   SpO2 97%  BMI 31.78 kg/m   Wt Readings from Last 3 Encounters:  06/09/20 209 lb (94.8 kg)  06/02/20 213 lb (96.6 kg)  05/26/20 207 lb (93.9 kg)    Physical Exam Vitals and nursing note reviewed.  Constitutional:      General: He is not in acute distress.    Appearance: Normal appearance. He is not ill-appearing,  toxic-appearing or diaphoretic.  HENT:     Head: Normocephalic and atraumatic.     Right Ear: External ear normal.     Left Ear: External ear normal.     Nose: Nose normal.     Mouth/Throat:     Mouth: Mucous membranes are moist.     Pharynx: Oropharynx is clear.  Eyes:     General: No scleral icterus.       Right eye: No discharge.        Left eye: No discharge.     Extraocular Movements: Extraocular movements intact.     Conjunctiva/sclera: Conjunctivae normal.     Pupils: Pupils are equal, round, and reactive to light.  Cardiovascular:     Rate and Rhythm: Normal rate and regular rhythm.     Pulses: Normal pulses.     Heart sounds: Normal heart sounds. No murmur heard. No friction rub. No gallop.   Pulmonary:     Effort: Pulmonary effort is normal. No respiratory distress.     Breath sounds: No stridor. No wheezing, rhonchi or rales.     Comments: Coarse breath sounds bilaterally Chest:     Chest wall: No tenderness.  Musculoskeletal:        General: Normal range of motion.     Cervical back: Normal range of motion and neck supple.  Skin:    General: Skin is warm and dry.     Capillary Refill: Capillary refill takes less than 2 seconds.     Coloration: Skin is not jaundiced or pale.     Findings: No bruising, erythema, lesion or rash.  Neurological:     General: No focal deficit present.     Mental Status: He is alert and oriented to person, place, and time. Mental status is at baseline.  Psychiatric:        Mood and Affect: Mood normal.        Behavior: Behavior normal.        Thought Content: Thought content normal.        Judgment: Judgment normal.     Results for orders placed or performed in visit on 06/09/20  CBC with Differential/Platelet  Result Value Ref Range   WBC 8.4 3.4 - 10.8 x10E3/uL   RBC 3.87 (L) 4.14 - 5.80 x10E6/uL   Hemoglobin 12.2 (L) 13.0 - 17.7 g/dL   Hematocrit 36.3 (L) 37.5 - 51.0 %   MCV 94 79 - 97 fL   MCH 31.5 26.6 - 33.0 pg   MCHC  33.6 31.5 - 35.7 g/dL   RDW 13.7 11.6 - 15.4 %   Platelets 301 150 - 450 x10E3/uL   Neutrophils 62 Not Estab. %   Lymphs 24 Not Estab. %   Monocytes 10 Not Estab. %   Eos 3 Not Estab. %   Basos 1 Not Estab. %   Neutrophils Absolute 5.2 1.4 - 7.0 x10E3/uL   Lymphocytes Absolute 2.0 0.7 - 3.1 x10E3/uL   Monocytes Absolute 0.9 0.1 - 0.9 x10E3/uL   EOS (ABSOLUTE) 0.2 0.0 - 0.4 x10E3/uL   Basophils Absolute 0.1 0.0 - 0.2 x10E3/uL  Immature Granulocytes 0 Not Estab. %   Immature Grans (Abs) 0.0 0.0 - 0.1 [id number]/JI  Basic metabolic panel  Result Value Ref Range   Glucose 85 65 - 99 mg/dL   BUN 20 8 - 27 mg/dL   Creatinine, Ser 1.10 0.76 - 1.27 mg/dL   eGFR 76 >59 mL/min/1.73   BUN/Creatinine Ratio 18 10 - 24   Sodium 135 134 - 144 mmol/L   Potassium 4.1 3.5 - 5.2 mmol/L   Chloride 97 96 - 106 mmol/L   CO2 19 (L) 20 - 29 mmol/L   Calcium 9.3 8.6 - 10.2 mg/dL      Assessment & Plan:   Problem List Items Addressed This Visit      Cardiovascular and Mediastinum   Hypertension    Under good control on current regimen. Continue current regimen. Continue to monitor. Call with any concerns. Refills given. Labs drawn today.        Aortic atherosclerosis (HCC)    Will keep BP, cholesterol and sugars under good control. Continue to monitor. Call with any concerns.         Respiratory   Sleep apnea    Needs new Rx for CPAP machine. Ordered today. Call with any concerns. Continue to monitor.         Endocrine   Controlled type 2 diabetes mellitus with complication, without long-term current use of insulin ([place])    Newly diagnosed with A1c of 6.9. Will work on diet and exercise and recheck 3 months. Call with any concerns. Continue to monitor.        Other Visit Diagnoses    Multifocal pneumonia    -  Primary   Will treat with doxycycline. To see pulmonology in 2 weeks. Call with any concerns.    Relevant Medications   doxycycline (VIBRA-TABS) 100 MG tablet   Hypokalemia        Rechecking labs today. Await results. Treat as needed.    Relevant Orders   Basic metabolic panel (Completed)   Leukocytosis, unspecified type       Rechecking labs today. Await results. Treat as needed.    Relevant Orders   CBC with Differential/Platelet (Completed)       Follow up plan: Return in about 3 months (around 09/09/2020).

## 2020-06-09 NOTE — Telephone Encounter (Signed)
Spoke to pt. This is in reference to his Vascepa Rx.  Pt states not covered at pharmacy.  Discussed a couple options with pt:  1) We could let pharmacy send [place] prior auth to be completed, and give him samples in the meantime.  2) Or, I can send his Rx to [facility] which I explained to pt, is a mail order pharmacy that has a program that ships Vascepa to pt's with $0 co-pay for MOST commercial insurances. I told him we can try this and see if it goes through.   Pt appreciative of options, and would like to have Rx sent to [facility] at this time. And if this does not work, he does have a co-pay card from [place] to possibly get for $9.   Rx faxed to [facility] @ [phone]

## 2020-06-09 NOTE — Telephone Encounter (Signed)
Per patient pharmacy cannot fill .  Insurance has not approved yet. Please advise.

## 2020-06-10 ENCOUNTER — Other Ambulatory Visit: Payer: Self-pay | Admitting: Family Medicine

## 2020-06-10 ENCOUNTER — Encounter: Payer: Self-pay | Admitting: Family Medicine

## 2020-06-10 DIAGNOSIS — I700 Atherosclerosis of aorta: Secondary | ICD-10-CM | POA: Insufficient documentation

## 2020-06-10 DIAGNOSIS — D649 Anemia, unspecified: Secondary | ICD-10-CM

## 2020-06-10 DIAGNOSIS — E118 Type 2 diabetes mellitus with unspecified complications: Secondary | ICD-10-CM | POA: Insufficient documentation

## 2020-06-10 LAB — CBC WITH DIFFERENTIAL/PLATELET
Basophils Absolute: 0.1 10*3/uL (ref 0.0–0.2)
Basos: 1 %
EOS (ABSOLUTE): 0.2 10*3/uL (ref 0.0–0.4)
Eos: 3 %
Hematocrit: 36.3 % — ABNORMAL LOW (ref 37.5–51.0)
Hemoglobin: 12.2 g/dL — ABNORMAL LOW (ref 13.0–17.7)
Immature Grans (Abs): 0 10*3/uL (ref 0.0–0.1)
Immature Granulocytes: 0 %
Lymphocytes Absolute: 2 10*3/uL (ref 0.7–3.1)
Lymphs: 24 %
MCH: 31.5 pg (ref 26.6–33.0)
MCHC: 33.6 g/dL (ref 31.5–35.7)
MCV: 94 fL (ref 79–97)
Monocytes Absolute: 0.9 10*3/uL (ref 0.1–0.9)
Monocytes: 10 %
Neutrophils Absolute: 5.2 10*3/uL (ref 1.4–7.0)
Neutrophils: 62 %
Platelets: 301 10*3/uL (ref 150–450)
RBC: 3.87 x10E6/uL — ABNORMAL LOW (ref 4.14–5.80)
RDW: 13.7 % (ref 11.6–15.4)
WBC: 8.4 10*3/uL (ref 3.4–10.8)

## 2020-06-10 LAB — BASIC METABOLIC PANEL
BUN/Creatinine Ratio: 18 (ref 10–24)
BUN: 20 mg/dL (ref 8–27)
CO2: 19 mmol/L — ABNORMAL LOW (ref 20–29)
Calcium: 9.3 mg/dL (ref 8.6–10.2)
Chloride: 97 mmol/L (ref 96–106)
Creatinine, Ser: 1.1 mg/dL (ref 0.76–1.27)
Glucose: 85 mg/dL (ref 65–99)
Potassium: 4.1 mmol/L (ref 3.5–5.2)
Sodium: 135 mmol/L (ref 134–144)
eGFR: 76 mL/min/{1.73_m2} (ref >59)

## 2020-06-10 NOTE — Assessment & Plan Note (Signed)
Will keep BP, cholesterol and sugars under good control. Continue to monitor. Call with any concerns.  

## 2020-06-10 NOTE — Assessment & Plan Note (Signed)
Under good control on current regimen. Continue current regimen. Continue to monitor. Call with any concerns. Refills given. Labs drawn today.   

## 2020-06-10 NOTE — Assessment & Plan Note (Signed)
Newly diagnosed with A1c of 6.9. Will work on diet and exercise and recheck 3 months. Call with any concerns. Continue to monitor.

## 2020-06-10 NOTE — Assessment & Plan Note (Signed)
Needs new Rx for CPAP machine. Ordered today. Call with any concerns. Continue to monitor.

## 2020-06-12 ENCOUNTER — Telehealth: Payer: Self-pay | Admitting: *Deleted

## 2020-06-12 NOTE — Telephone Encounter (Signed)
Received PA request from [place] for [place]. Completed information via CoverMymeds and approval is effective from today 06/12/2020 to 06/11/2021.

## 2020-06-12 NOTE — Telephone Encounter (Signed)
Pt had repeat Bmet at PCP on Friday, potassium now at 4.1.  Pt had stopped taking otc Potassium supplement after last result of 5.1.

## 2020-06-12 NOTE — Telephone Encounter (Signed)
Noted  

## 2020-06-19 ENCOUNTER — Encounter: Payer: Self-pay | Admitting: Pulmonary Disease

## 2020-06-19 ENCOUNTER — Other Ambulatory Visit: Payer: Self-pay

## 2020-06-19 ENCOUNTER — Ambulatory Visit (INDEPENDENT_AMBULATORY_CARE_PROVIDER_SITE_OTHER): Payer: BC Managed Care – PPO | Admitting: Pulmonary Disease

## 2020-06-19 ENCOUNTER — Telehealth: Payer: Self-pay | Admitting: Pulmonary Disease

## 2020-06-19 VITALS — BP 122/74 | HR 54 | Temp 97.3°F | Ht 68.0 in | Wt 209.0 lb

## 2020-06-19 DIAGNOSIS — G4733 Obstructive sleep apnea (adult) (pediatric): Secondary | ICD-10-CM

## 2020-06-19 DIAGNOSIS — Z0000 Encounter for general adult medical examination without abnormal findings: Secondary | ICD-10-CM

## 2020-06-19 DIAGNOSIS — R918 Other nonspecific abnormal finding of lung field: Secondary | ICD-10-CM

## 2020-06-19 DIAGNOSIS — G4730 Sleep apnea, unspecified: Secondary | ICD-10-CM | POA: Diagnosis not present

## 2020-06-19 DIAGNOSIS — Z87891 Personal history of nicotine dependence: Secondary | ICD-10-CM

## 2020-06-19 DIAGNOSIS — Z720 Tobacco use: Secondary | ICD-10-CM

## 2020-06-19 DIAGNOSIS — Z8616 Personal history of COVID-19: Secondary | ICD-10-CM | POA: Diagnosis not present

## 2020-06-19 NOTE — Progress Notes (Signed)
@Patient  ID: Daniel Cook, male    DOB: Sep 05, 1955, 65 y.o.   MRN: [id number]  Chief Complaint  Patient presents with  . sleep consult    Prior sleep study. Cpap is currently broken, last worn 3 weeks ago.     Referring provider: Arvil Chaco, PA*  HPI:  65 year old male former smoker initially referred to our office on 06/19/2020 for sleep consult as well as post COVID-19  PMH: There hypertension, hyperlipidemia, gout, history of COVID-19, aortic arthrosclerosis, type 2 diabetes Smoker/ Smoking History: Former smoker. Quit in 2000. 40 pack smoker.  Maintenance: Symbicort 160 Pt of: Needs outpatient pulmonary provider  06/19/2020  - Visit   65 year old male former smoker.  Current smokeless tobacco user.  Presenting to office today as a sleep consult.  Patient is also essentially a pulmonary consult as he is post COVID-19.  Patient referred by primary care.  Patient reports that he contracted COVID-19 in September/2021.  He was treated in the outpatient setting.  Did not require hospitalization.  Patient did not receive monoclonal antibody infusion.  He was unvaccinated.  Patient remains unvaccinated.  Patient reports that it took about 1.5 months to get back to his baseline.  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  This is the only long-haul her symptoms he is currently dealing with at this point time.  He started back walking in March/2022 and is currently up to 1.5 miles a day.  In February/2022 he had a cardiac CT that showed resolving multilobar bilateral pneumonia.  Is felt to be postinfectious or inflammatory nature.  Radiology recommended follow-up  HRCT in 3 to 6 months to assess for changes.  Patient is a 40-pack-year smoking history.  He quit smoking in 2000.  Patient does continue smokeless tobacco.  He is planning on working on stopping.  Patient is due for Pneumovax 23.    Patient with known obstructive sleep apnea.  He is  interested in trying to obtain a new CPAP as his CPAP is broken.  He is hoping to purchase this out-of-pocket.  He would prefer to not do this with the DME company.   Questionaires / Pulmonary Flowsheets:   Epworth:  Results of the Epworth flowsheet 06/19/2020  Sitting and reading 0  Watching TV 1  Sitting, inactive in a public place (e.g. a theatre or a meeting) 1  As a passenger in a car for an hour without a break 0  Lying down to rest in the afternoon when circumstances permit 1  Sitting and talking to someone 0  Sitting quietly after a lunch without alcohol 0  In a car, while stopped for a few minutes in traffic 0  Total score 3    Tests:   FENO:  No results found for: NITRICOXIDE  PFT: No flowsheet data found.  WALK:  SIX MIN WALK 06/19/2020  Supplimental Oxygen during Test? (L/min) No  Tech Comments: fast pace--no complaints.    Imaging: CT CARDIAC SCORING  Addendum Date: 05/30/2020   ADDENDUM REPORT: 05/30/2020 18:03 CLINICAL DATA:  Risk stratification EXAM: Coronary Calcium Score TECHNIQUE: The patient was scanned on a [employer]. Axial non-contrast 3 mm slices were carried out through the heart. The data set was analyzed on a dedicated work station and scored using the [place]. FINDINGS: Non-cardiac: See separate report from [facility]. Ascending Aorta: Normal size Pericardium: Normal Coronary arteries: Normal origin of left and right coronary arteries. Distribution of arterial calcifications if present,  as noted below; LM 0 LAD 85 LCx 4.23 RCA 95.5 Total 185 IMPRESSION: 1. Coronary calcium score of 185 . This was 73rd percentile for age and sex matched control. 2.  Recommend asa and statin if no contraindications Kate Sable Electronically Signed   By: Kate Sable M.D.   On: 05/30/2020 18:03   Result Date: 05/30/2020 EXAM: OVER-READ INTERPRETATION  CT CHEST The following report is an over-read performed by radiologist Dr. Vinnie Langton of [facility], [place] on 05/30/2020. This over-read does not include interpretation of cardiac or coronary anatomy or pathology. The coronary calcium score interpretation by the cardiologist is attached. COMPARISON:  None. FINDINGS: Aortic atherosclerosis. Patchy multifocal areas of ground-glass attenuation, septal thickening and regional architectural distortion are noted in the lungs bilaterally. Within the visualized portions of the thorax there are no suspicious appearing pulmonary nodules or masses, there is no acute consolidative airspace disease, no pleural effusions, no pneumothorax and no lymphadenopathy. Visualized portions of the upper abdomen are unremarkable. There are no aggressive appearing lytic or blastic lesions noted in the visualized portions of the skeleton. IMPRESSION: 1. The appearance of the lungs suggests resolving multilobar bilateral pneumonia, likely with peripheral areas of post infectious or inflammatory scarring. Outpatient referral to pulmonology for further clinical evaluation is recommended. In 3-6 months to assess follow-up high-resolution chest CT is recommended for temporal changes in the appearance of the lung parenchyma. 2.  Aortic Atherosclerosis (ICD10-I70.0). Electronically Signed: By: Vinnie Langton M.D. On: 05/30/2020 16:45    Lab Results:  CBC    Component Value Date/Time   WBC 8.4 06/09/2020 1645   WBC 10.7 (H) 05/26/2020 2311   RBC 3.87 (L) 06/09/2020 1645   RBC 4.15 (L) 05/26/2020 2311   HGB 12.2 (L) 06/09/2020 1645   HCT 36.3 (L) 06/09/2020 1645   PLT 301 06/09/2020 1645   MCV 94 06/09/2020 1645   MCH 31.5 06/09/2020 1645   MCH 31.6 05/26/2020 2311   MCHC 33.6 06/09/2020 1645   MCHC 33.9 05/26/2020 2311   RDW 13.7 06/09/2020 1645   LYMPHSABS 2.0 06/09/2020 1645   EOSABS 0.2 06/09/2020 1645   BASOSABS 0.1 06/09/2020 1645    BMET    Component Value Date/Time   NA 135 06/09/2020 1645   K 4.1 06/09/2020 1645   CL 97  06/09/2020 1645   CO2 19 (L) 06/09/2020 1645   GLUCOSE 85 06/09/2020 1645   GLUCOSE 108 (H) 05/26/2020 2311   BUN 20 06/09/2020 1645   CREATININE 1.10 06/09/2020 1645   CALCIUM 9.3 06/09/2020 1645   GFRNONAA 85 06/02/2020 1110   GFRNONAA >60 05/26/2020 2311   GFRAA 99 06/02/2020 1110    BNP No results found for: BNP  ProBNP No results found for: PROBNP  Specialty Problems      Pulmonary Problems   Sleep apnea    Using CPAP faithfully         No Known Allergies  Immunization History  Administered Date(s) Administered  . Influenza,inj,Quad PF,6+ Mos 12/26/2015, 02/04/2017  . Tdap 12/19/2010    Past Medical History:  Diagnosis Date  . COVID-19 12/2019  . Gout   . High cholesterol   . Hypertension   . Obesity   . Sleep apnea   . Vertigo     Tobacco History: Social History   Tobacco Use  Smoking Status Former Smoker  . Packs/day: 2.00  . Years: 20.00  . Pack years: 40.00  . Types: Cigarettes  . Quit date: 10/04/1998  .  Years since quitting: 21.7  Smokeless Tobacco Current User  . Types: Chew  Tobacco Comment   dip occassionally   Ready to quit: Not Answered Counseling given: Not Answered Comment: dip occassionally   Continue to not smoke  Outpatient Encounter Medications as of 06/19/2020  Medication Sig  . albuterol (VENTOLIN HFA) 108 (90 Base) MCG/ACT inhaler INHALE 1 TO 2 PUFFS INTO THE LUNGS EVERY 6 HOURS AS NEEDED FOR WHEEZING OR SHORTNESS OF BREATH  . allopurinol (ZYLOPRIM) 300 MG tablet Take 1 tablet (300 mg total) by mouth daily.  Marland Kitchen amLODipine (NORVASC) 2.5 MG tablet Take 1 tablet (2.5 mg total) by mouth daily.  . Ascorbic Acid (VITAMIN C) 1000 MG tablet Take 1,000 mg by mouth daily.  Marland Kitchen aspirin 325 MG tablet Take 325 mg by mouth daily.  Marland Kitchen atorvastatin (LIPITOR) 80 MG tablet Take 1 tablet (80 mg total) by mouth daily.  . budesonide-formoterol (SYMBICORT) 160-4.5 MCG/ACT inhaler Inhale 2 puffs into the lungs 2 (two) times daily.  .  Cholecalciferol (VITAMIN D3) 25 MCG (1000 UT) CAPS Take 1 capsule by mouth daily.  . colchicine 0.6 MG tablet Take 1 tablet (0.6 mg total) by mouth daily as needed.  . doxycycline (VIBRA-TABS) 100 MG tablet Take 1 tablet (100 mg total) by mouth 2 (two) times daily.  Marland Kitchen ezetimibe (ZETIA) 10 MG tablet Take 1 tablet (10 mg total) by mouth daily.  Marland Kitchen gabapentin (NEURONTIN) 300 MG capsule Take 2 capsules (600 mg total) by mouth 3 (three) times daily.  . Glucosamine-Chondroit-Biofl-Mn (GLUCOSAMINE CHONDROIT,BIOFLAV, PO) Take 1 capsule by mouth daily.  . hydrochlorothiazide (HYDRODIURIL) 12.5 MG tablet Take 1 tablet (12.5 mg total) by mouth daily.  Marland Kitchen HYDROcodone-acetaminophen (NORCO/VICODIN) 5-325 MG tablet 1 po tid prn  . icosapent Ethyl (VASCEPA) 1 g capsule Take 2 capsules (2 g total) by mouth 2 (two) times daily.  Marland Kitchen loratadine (CLARITIN) 10 MG tablet Take 1 tablet (10 mg total) by mouth daily.  Marland Kitchen losartan (COZAAR) 100 MG tablet Take 1 tablet (100 mg total) by mouth daily.  . magnesium gluconate (MAGONATE) 500 MG tablet Take 500 mg by mouth daily.  . naproxen (NAPROSYN) 500 MG tablet TAKE ONE TABLET TWICE DAILY WITH A MEAL  . Omega-3 Fatty Acids (FISH OIL) 1200 MG CAPS Take 1 capsule by mouth daily.  Marland Kitchen omeprazole (PRILOSEC) 20 MG capsule Take 1 capsule (20 mg total) by mouth daily.  . ondansetron (ZOFRAN) 4 MG tablet Take 1 tablet (4 mg total) by mouth every 8 (eight) hours as needed for nausea or vomiting.  . simethicone (MYLICON) 40 BS/9.6GE drops Take 0.6 mLs (40 mg total) by mouth 4 (four) times daily as needed for flatulence.  . sucralfate (CARAFATE) 1 g tablet TAKE 1 TABLET BY MOUTH FOUR TIMES DAILY WITH MEALS AND AT BEDTIME  . tiZANidine (ZANAFLEX) 4 MG tablet Take 4 mg by mouth as needed.  . Vitamin E 134 MG (200 UNIT) TABS Take 1 tablet by mouth daily.  . Zinc 50 MG CAPS Take 1 capsule by mouth daily.   No facility-administered encounter medications on file as of 06/19/2020.     Review of  Systems  Review of Systems  Constitutional: Negative for activity change, chills, fatigue, fever and unexpected weight change.  HENT: Negative for congestion, postnasal drip, rhinorrhea, sinus pressure, sinus pain and sore throat.   Eyes: Negative.   Respiratory: Negative for cough, shortness of breath and wheezing.   Cardiovascular: Negative for chest pain and palpitations.  Gastrointestinal: Negative for diarrhea,  nausea and vomiting.  Endocrine: Negative.   Genitourinary: Negative.   Musculoskeletal: Negative.   Skin: Negative.   Neurological: Negative for dizziness and headaches.  Psychiatric/Behavioral: Negative.  Negative for dysphoric mood. The patient is not nervous/anxious.   All other systems reviewed and are negative.    Physical Exam  BP 122/74 (BP Location: Left Arm, Cuff Size: Normal)   Pulse (!) 54   Temp (!) 97.3 F (36.3 C) (Temporal)   Ht 5\' 8"  (1.727 m)   Wt 209 lb (94.8 kg)   SpO2 97%   BMI 31.78 kg/m   Wt Readings from Last 5 Encounters:  06/19/20 209 lb (94.8 kg)  06/09/20 209 lb (94.8 kg)  06/02/20 213 lb (96.6 kg)  05/26/20 207 lb (93.9 kg)  05/02/20 218 lb (98.9 kg)    BMI Readings from Last 5 Encounters:  06/19/20 31.78 kg/m  06/09/20 31.78 kg/m  06/02/20 32.39 kg/m  05/26/20 31.47 kg/m  05/02/20 33.15 kg/m     Physical Exam Vitals and nursing note reviewed.  Constitutional:      General: He is not in acute distress.    Appearance: Normal appearance. He is obese.  HENT:     Head: Normocephalic and atraumatic.     Right Ear: Hearing and external ear normal.     Left Ear: Hearing and external ear normal.     Nose: Nose normal. No mucosal edema or rhinorrhea.     Right Turbinates: Not enlarged.     Left Turbinates: Not enlarged.     Mouth/Throat:     Mouth: Mucous membranes are dry.     Pharynx: Oropharynx is clear. No oropharyngeal exudate.  Eyes:     Pupils: Pupils are equal, round, and reactive to light.  Cardiovascular:      Rate and Rhythm: Normal rate and regular rhythm.     Pulses: Normal pulses.     Heart sounds: Normal heart sounds. No murmur heard.   Pulmonary:     Effort: Pulmonary effort is normal.     Breath sounds: No decreased breath sounds, wheezing or rales.  Musculoskeletal:     Cervical back: Normal range of motion.     Right lower leg: No edema.     Left lower leg: No edema.  Lymphadenopathy:     Cervical: No cervical adenopathy.  Skin:    General: Skin is warm and dry.     Capillary Refill: Capillary refill takes less than 2 seconds.     Findings: No erythema or rash.  Neurological:     General: No focal deficit present.     Mental Status: He is alert and oriented to person, place, and time.     Motor: No weakness.     Coordination: Coordination normal.     Gait: Gait is intact. Gait normal.  Psychiatric:        Mood and Affect: Mood normal.        Behavior: Behavior normal. Behavior is cooperative.        Thought Content: Thought content normal.        Judgment: Judgment normal.       Assessment & Plan:   Sleep apnea Plan: Continue CPAP therapy We will contact a DME company to see if there is other options available that are more cost effective to replacing her CPAP Contact your previous DME company and see if her CPAP can be refurbished/fixed/serviced  Abnormal findings on diagnostic imaging of lung Plan: Recommend high-resolution CT chest in  [id number]/7116 Pulmonary function testing ordered Walk today in office stable   Former smoker Plan: walk today in office Pulmonary function testing ordered  Healthcare maintenance Plan: Recommend Pneumovax 23, patient declined today will consider in the future Recommend COVID-19 vaccination, patient declined will consider in the future  Personal history of COVID-19 Plan: We will order high-resolution CT chest to compare to cardiovascular CT in February/2022 We will order pulmonary function testing Walk today in office  stable without any oxygen desaturations  Smokeless tobacco use Plan: Provided tobacco cessation resources today Strongly emphasized importance of stopping smokeless tobacco use    Return in about 2 months (around 08/19/2020), or if symptoms worsen or fail to improve, for [facility] - Dr. Mortimer Fries, [place].   Lauraine Rinne, NP 06/19/2020   This appointment required 46 minutes of patient care (this includes precharting, chart review, review of results, face-to-face care, etc.).

## 2020-06-19 NOTE — Assessment & Plan Note (Signed)
Plan: We will order high-resolution CT chest to compare to cardiovascular CT in February/2022 We will order pulmonary function testing Walk today in office stable without any oxygen desaturations

## 2020-06-19 NOTE — Assessment & Plan Note (Signed)
Plan: Continue CPAP therapy We will contact a DME company to see if there is other options available that are more cost effective to replacing her CPAP Contact your previous DME company and see if her CPAP can be refurbished/fixed/serviced

## 2020-06-19 NOTE — Progress Notes (Signed)
Reviewed and agree with assessment/plan.   Chesley Mires, MD [facility] Pulmonary/Critical Care 06/19/2020, 5:28 PM Pager:  [phone]

## 2020-06-19 NOTE — Assessment & Plan Note (Signed)
Plan: Recommend high-resolution CT chest in May/2022 Pulmonary function testing ordered Walk today in office stable

## 2020-06-19 NOTE — Assessment & Plan Note (Signed)
Plan: Provided tobacco cessation resources today Strongly emphasized importance of stopping smokeless tobacco use

## 2020-06-19 NOTE — Assessment & Plan Note (Signed)
Plan: walk today in office Pulmonary function testing ordered

## 2020-06-19 NOTE — Telephone Encounter (Signed)
Patient is aware of below message and voiced his understanding.  Order has been placed to [employer]. Patient would like to get supplies online. Message has been sent to [facility] via community message.  Nothing further needed at this time.

## 2020-06-19 NOTE — Telephone Encounter (Signed)
Spoke to patient and relayed below recommendations.  patient would like to go through [place] and [employer].   Aaron Edelman, please advise on settings? Thanks

## 2020-06-19 NOTE — Telephone Encounter (Signed)
06/19/2020  Please contact the patient and see what will be the best option for him.  Out-of-pocket estimates that DME company ([employer]) would be around $800 for new CPAP.  Patient could get established with a new DME company and simply receive the CPAP through his insurance.  He could continue to obtain supplies from [employer] as he prefers.  This may be the best option moving forward as that would not require the patient have such a high out-of-pocket cost for CPAP.  Patient's initial estimates when researching online would be that it be between $800-$1200 for a new CPAP out-of-pocket.  If he proceeded forward the DME company and this can be ran through his insurance to have we have a less costly option.  Patient could continue to obtain supplies as needed from [employer] if he would prefer to do this.  As for if the patient's current CPAP could be refurbished.  I potentially this could be an option if the patient remembers which DME company he received the original CPAP therapy.  He could contact them to see if he is able to get this machine evaluated and fixed.  Wyn Quaker, FNP

## 2020-06-19 NOTE — Telephone Encounter (Signed)
06/19/2020  We recommend CPAP settings listed below:  APAP 5-15 Mask of choice and supplies  Can always work on titrating and changing settings once he gets restarted.  Would recommend starting with [employer] which is local in [place].  Can you please send a message to [facility] and notify her of the patient.  Please let her know this the patient that I spoke with her about.  He would prefer to continue to obtain supplies through [email or web address].  Wyn Quaker, FNP

## 2020-06-19 NOTE — Patient Instructions (Addendum)
You were seen today by Lauraine Rinne, NP  for:   1. Abnormal findings on diagnostic imaging of lung  - CT Chest High Resolution; Future  We will obtain a high-resolution CT chest in May/2022  We will obtain pulmonary function testing  Walk today in office stable  2. Personal history of COVID-19  - CT Chest High Resolution; Future - Pulmonary Function Test [facility] Only; Future  As discussed today would recommend that you consider obtaining the COVID-19 vaccination as she could get Covid again  You have multiple comorbidities that puts you at high risk for complications of GLOVF-64 should you contract variant  3. Sleep apnea, unspecified type  We will investigate different cost alternatives for out-of-pocket CPAP use.  We have already obtained your sleep study records  4. Healthcare maintenance  Would recommend they discuss with primary care obtaining the Pneumovax 23 which is a pneumonia vaccine to help protect against 23 given strains of bacterial pneumonia  5. Former smoker  - Animator [facility] Only; Future  6. Smokeless tobacco use  We recommend that you stop smoking.  >>>You need to set a quit date >>>If you have friends or family who smoke, let them know you are trying to quit and not to smoke around you or in your living environment  Smoking Cessation Resources:  1 800 QUIT NOW  >>> Patient to call this resource and utilize it to help support her quit smoking >>> Keep up your hard work with stopping smoking  You can also contact the [facility] >>>For smoking cessation classes call [phone]  We do not recommend using e-cigarettes as a form of stopping smoking  You can sign up for smoking cessation support texts and information:  >>>https://smokefree.gov/smokefreetxt     We recommend today:  Orders Placed This Encounter  Procedures  . CT Chest High Resolution    -High-res CT with supine and prone positioning -inspiratory and  expiratory cuts.  -Only to be read by Dr. Rosario Jacks and Dr. Weber Cooks.    Standing Status:   Future    Standing Expiration Date:   06/19/2021    Scheduling Instructions:     Schedule in May / 2022    Order Specific Question:   Preferred imaging location?    Answer:   [facility]  . Pulmonary Function Test [facility] Only    Standing Status:   Future    Standing Expiration Date:   06/19/2021    Order Specific Question:   Full PFT: includes the following: basic spirometry, spirometry pre & post bronchodilator, diffusion capacity (DLCO), lung volumes    Answer:   Full PFT    Order Specific Question:   Release to patient    Answer:   Immediate   Orders Placed This Encounter  Procedures  . CT Chest High Resolution  . Pulmonary Function Test [facility] Only   No orders of the defined types were placed in this encounter.   Follow Up:    Return in about 2 months (around 08/19/2020), or if symptoms worsen or fail to improve, for [facility] - Dr. Mortimer Fries, [place].   Notification of test results are managed in the following manner: If there are  any recommendations or changes to the  plan of care discussed in office today,  we will contact you and let you know what they are. If you do not hear from [place], then your results are normal and you can view them through your  MyChart account , or a letter will be sent to you. Thank you again for trusting [place] with your care  - Thank you, [facility]    It is flu season:   >>> Best ways to protect herself from the flu: Receive the yearly flu vaccine, practice good hand hygiene washing with soap and also using hand sanitizer when available, eat a nutritious meals, get adequate rest, hydrate appropriately       Please contact the office if your symptoms worsen or you have concerns that you are not improving.   Thank you for choosing [facility] for your healthcare, and for allowing [place] to partner with you on your healthcare journey. I  am thankful to be able to provide care to you today.   Wyn Quaker FNP-C

## 2020-06-19 NOTE — Assessment & Plan Note (Signed)
Plan: Recommend Pneumovax 23, patient declined today will consider in the future Recommend COVID-19 vaccination, patient declined will consider in the future

## 2020-06-20 ENCOUNTER — Ambulatory Visit (INDEPENDENT_AMBULATORY_CARE_PROVIDER_SITE_OTHER): Payer: BC Managed Care – PPO

## 2020-06-20 ENCOUNTER — Other Ambulatory Visit: Payer: Self-pay

## 2020-06-20 DIAGNOSIS — Z8616 Personal history of COVID-19: Secondary | ICD-10-CM | POA: Diagnosis not present

## 2020-06-20 DIAGNOSIS — M5136 Other intervertebral disc degeneration, lumbar region: Secondary | ICD-10-CM | POA: Diagnosis not present

## 2020-06-20 DIAGNOSIS — R42 Dizziness and giddiness: Secondary | ICD-10-CM

## 2020-06-20 DIAGNOSIS — M5126 Other intervertebral disc displacement, lumbar region: Secondary | ICD-10-CM | POA: Diagnosis not present

## 2020-06-20 DIAGNOSIS — Z87898 Personal history of other specified conditions: Secondary | ICD-10-CM | POA: Diagnosis not present

## 2020-06-20 DIAGNOSIS — R001 Bradycardia, unspecified: Secondary | ICD-10-CM | POA: Diagnosis not present

## 2020-06-20 DIAGNOSIS — Z79899 Other long term (current) drug therapy: Secondary | ICD-10-CM | POA: Diagnosis not present

## 2020-06-20 DIAGNOSIS — M5416 Radiculopathy, lumbar region: Secondary | ICD-10-CM | POA: Diagnosis not present

## 2020-06-20 DIAGNOSIS — Z79891 Long term (current) use of opiate analgesic: Secondary | ICD-10-CM | POA: Diagnosis not present

## 2020-06-20 LAB — ECHOCARDIOGRAM COMPLETE
Area-P 1/2: 2.96 cm2
P 1/2 time: 501 msec
S' Lateral: 3.2 cm

## 2020-06-21 ENCOUNTER — Telehealth: Payer: Self-pay | Admitting: Pulmonary Disease

## 2020-06-21 ENCOUNTER — Telehealth: Payer: Self-pay | Admitting: *Deleted

## 2020-06-21 DIAGNOSIS — Z8616 Personal history of COVID-19: Secondary | ICD-10-CM

## 2020-06-21 NOTE — Telephone Encounter (Signed)
Patient's insurance has denied his CT scheduled on 07/04/20. They stated a Chest xray has not been performed. If the chest xray gets done then the doctor will need to do peer to peer to possibly get the Ct approved at that time

## 2020-06-21 NOTE — Telephone Encounter (Signed)
Brian, please advise. Thanks 

## 2020-06-21 NOTE — Telephone Encounter (Signed)
The patient has been notified of the result and verbalized understanding.  All questions (if any) were answered.     

## 2020-06-21 NOTE — Telephone Encounter (Signed)
-----   Message from Arvil Chaco, PA-C sent at 06/21/2020  9:23 AM EDT ----- Echo results very reassuring: --Normal pump function or heart squeeze. --Walls of the heart are moving normally. --Walls are a little slow to relax, which can happen over time with elevated BP. --Mildly leaky aortic valve, which is not concerning, and which we can monitor with periodic echo.  We will look out for the cardiac monitor results!

## 2020-06-23 ENCOUNTER — Telehealth: Payer: Self-pay

## 2020-06-23 NOTE — Telephone Encounter (Signed)
[employer] is requesting a copy of sleep study.  I have contacted feeling great and was advised that a signed medical release form would need to be faxed.   Lm for patient to make him aware of this information.

## 2020-06-23 NOTE — Telephone Encounter (Signed)
Daniel Cook wants the Chest Ct to be done in May but the patient has his appt on 08/08/20 with Daniel Cook and the CT has been rescheduled for 08/01/20 @ 8:00am The patient is aware of this appt change

## 2020-06-23 NOTE — Telephone Encounter (Signed)
Spoke to patient and relayed below message.  patient stated that he would come by next week to sign medical request form.  Form have been placed up front for pickup.

## 2020-06-23 NOTE — Telephone Encounter (Signed)
Rodena Piety, please see Brian's response. Thanks

## 2020-06-23 NOTE — Telephone Encounter (Signed)
Pt is returning a phone call. Pt can be reached at [phone].

## 2020-06-23 NOTE — Telephone Encounter (Signed)
06/23/2020  A chest x-ray is not necessary.  Patient has a cardiac CT read by radiologist on 05/30/2020.  The over read interpretation of the CT chest by the radiology team is listed below:  OVER-READ INTERPRETATION  CT CHEST  The following report is an over-read performed by radiologist Dr. Vinnie Langton of [facility], [place] on 05/30/2020. This over-read does not include interpretation of cardiac or coronary anatomy or pathology. The coronary calcium score interpretation by the cardiologist is attached.  COMPARISON:  None.  FINDINGS: Aortic atherosclerosis. Patchy multifocal areas of ground-glass attenuation, septal thickening and regional architectural distortion are noted in the lungs bilaterally. Within the visualized portions of the thorax there are no suspicious appearing pulmonary nodules or masses, there is no acute consolidative airspace disease, no pleural effusions, no pneumothorax and no lymphadenopathy. Visualized portions of the upper abdomen are unremarkable. There are no aggressive appearing lytic or blastic lesions noted in the visualized portions of the skeleton.  IMPRESSION: 1. The appearance of the lungs suggests resolving multilobar bilateral pneumonia, likely with peripheral areas of post infectious or inflammatory scarring. Outpatient referral to pulmonology for further clinical evaluation is recommended. In 3-6 months to assess follow-up high-resolution chest CT is recommended for temporal changes in the appearance of the lung parenchyma. 2.  Aortic Atherosclerosis (ICD10-I70.0).  Electronically Signed: By: Vinnie Langton M.D. On: 05/30/2020 16:45  Now of note patient does not need a CT of his chest in March/2022.  In the order and clearly states schedule in May/2022.  Please reschedule.  I would like for the patient to have the repeat CT of his chest done in May/2022.  Wyn Quaker, FNP

## 2020-06-26 ENCOUNTER — Telehealth: Payer: Self-pay

## 2020-06-26 NOTE — Telephone Encounter (Signed)
Patient is aware of date/time of covid test prior to PFT.  

## 2020-06-27 ENCOUNTER — Other Ambulatory Visit: Payer: Self-pay

## 2020-06-27 ENCOUNTER — Other Ambulatory Visit: Payer: BC Managed Care – PPO

## 2020-06-27 DIAGNOSIS — D649 Anemia, unspecified: Secondary | ICD-10-CM | POA: Diagnosis not present

## 2020-06-28 LAB — IRON AND TIBC
Iron Saturation: 20 % (ref 15–55)
Iron: 57 ug/dL (ref 38–169)
Total Iron Binding Capacity: 282 ug/dL (ref 250–450)
UIBC: 225 ug/dL (ref 111–343)

## 2020-06-28 LAB — CBC WITH DIFFERENTIAL/PLATELET
Basophils Absolute: 0.1 10*3/uL (ref 0.0–0.2)
Basos: 1 %
EOS (ABSOLUTE): 0.2 10*3/uL (ref 0.0–0.4)
Eos: 2 %
Hematocrit: 34.6 % — ABNORMAL LOW (ref 37.5–51.0)
Hemoglobin: 11.2 g/dL — ABNORMAL LOW (ref 13.0–17.7)
Immature Grans (Abs): 0 10*3/uL (ref 0.0–0.1)
Immature Granulocytes: 0 %
Lymphocytes Absolute: 1.8 10*3/uL (ref 0.7–3.1)
Lymphs: 19 %
MCH: 30.7 pg (ref 26.6–33.0)
MCHC: 32.4 g/dL (ref 31.5–35.7)
MCV: 95 fL (ref 79–97)
Monocytes Absolute: 0.8 10*3/uL (ref 0.1–0.9)
Monocytes: 8 %
Neutrophils Absolute: 6.9 10*3/uL (ref 1.4–7.0)
Neutrophils: 70 %
Platelets: 243 10*3/uL (ref 150–450)
RBC: 3.65 x10E6/uL — ABNORMAL LOW (ref 4.14–5.80)
RDW: 13.5 % (ref 11.6–15.4)
WBC: 9.7 10*3/uL (ref 3.4–10.8)

## 2020-06-28 LAB — VITAMIN B12: Vitamin B-12: 371 pg/mL (ref 232–1245)

## 2020-06-28 LAB — FERRITIN: Ferritin: 98 ng/mL (ref 30–400)

## 2020-06-28 NOTE — Telephone Encounter (Signed)
Signed medical release form has been faxed to [employer].  Will await sleep study.

## 2020-06-29 ENCOUNTER — Other Ambulatory Visit
Admission: RE | Admit: 2020-06-29 | Discharge: 2020-06-29 | Disposition: A | Discharge status: Home / Self Care | Payer: BC Managed Care – PPO | Source: Ambulatory Visit | Attending: Pulmonary Disease | Admitting: Pulmonary Disease

## 2020-06-29 ENCOUNTER — Other Ambulatory Visit: Payer: Self-pay

## 2020-06-29 DIAGNOSIS — Z01812 Encounter for preprocedural laboratory examination: Secondary | ICD-10-CM | POA: Diagnosis not present

## 2020-06-29 DIAGNOSIS — Z20822 Contact with and (suspected) exposure to covid-19: Secondary | ICD-10-CM | POA: Diagnosis not present

## 2020-06-29 LAB — SARS CORONAVIRUS 2 (TAT 6-24 HRS): SARS Coronavirus 2: NEGATIVE

## 2020-06-29 NOTE — Telephone Encounter (Signed)
Sleep study has been received and given to [place].

## 2020-06-30 ENCOUNTER — Encounter: Payer: Self-pay | Admitting: *Deleted

## 2020-06-30 ENCOUNTER — Ambulatory Visit: Payer: BC Managed Care – PPO | Admitting: Physician Assistant

## 2020-06-30 ENCOUNTER — Ambulatory Visit: Payer: BC Managed Care – PPO | Attending: Pulmonary Disease

## 2020-06-30 ENCOUNTER — Other Ambulatory Visit: Payer: Self-pay

## 2020-06-30 DIAGNOSIS — Z8616 Personal history of COVID-19: Secondary | ICD-10-CM

## 2020-06-30 DIAGNOSIS — Z87891 Personal history of nicotine dependence: Secondary | ICD-10-CM | POA: Diagnosis not present

## 2020-06-30 MED ORDER — ALBUTEROL SULFATE (2.5 MG/3ML) 0.083% IN NEBU
2.5000 mg | INHALATION_SOLUTION | Freq: Once | RESPIRATORY_TRACT | Status: AC
  Administered 2020-06-30: 2.5 mg via RESPIRATORY_TRACT
  Filled 2020-06-30: qty 3

## 2020-07-03 ENCOUNTER — Other Ambulatory Visit: Payer: Self-pay | Admitting: Family Medicine

## 2020-07-03 DIAGNOSIS — D649 Anemia, unspecified: Secondary | ICD-10-CM

## 2020-07-04 ENCOUNTER — Ambulatory Visit: Payer: BC Managed Care – PPO

## 2020-07-14 DIAGNOSIS — R001 Bradycardia, unspecified: Secondary | ICD-10-CM | POA: Diagnosis not present

## 2020-07-14 DIAGNOSIS — R42 Dizziness and giddiness: Secondary | ICD-10-CM | POA: Diagnosis not present

## 2020-07-19 DIAGNOSIS — G4733 Obstructive sleep apnea (adult) (pediatric): Secondary | ICD-10-CM | POA: Diagnosis not present

## 2020-07-20 NOTE — Telephone Encounter (Signed)
Ct has been rescheduled for 08/03/20 and I have faxed records again trying to get the CT approved

## 2020-07-24 NOTE — Telephone Encounter (Signed)
Insurance has denied the Ct for a 2nd time because a CXR has not been done since 04/10/18.

## 2020-07-25 ENCOUNTER — Telehealth: Payer: Self-pay

## 2020-07-25 NOTE — Progress Notes (Signed)
Office Visit    Patient Name: [facility] Date of Encounter: 07/26/2020  PCP:  Valerie Roys, DO   [place] Cardiologist:  Ida Rogue, MD  Advanced Practice Provider:  Arvil Chaco, PA-C Electrophysiologist:  None   Chief Complaint    Chief Complaint  Patient presents with  . Other    Follow up post ECHO and ZIO - Patient c.o BP being elevated. Meds reviewed verbally with patient.     65 year old male with history of hypertension, hyperlipidemia, asymptomatic sinus bradycardia, previous dizziness/history of dizziness resolving with medication changes, trivial AR/MR/TR/PR (2019 echo), aortic atherosclerosis by recent 05/2020 CT, CAC score 185 x 05/2020 CT, sleep apnea on CPAP, prior COVID-19 infection 02/2020, prior tobacco use, gout, and here today for follow-up after echo, PFTs, and Zio.  Past Medical History    Past Medical History:  Diagnosis Date  . COVID-19 12/2019  . Gout   . High cholesterol   . Hypertension   . Obesity   . Sleep apnea   . Vertigo    Past Surgical History:  Procedure Laterality Date  . knee surgeries      Allergies  No Known Allergies  History of Present Illness    Daniel Cook is a 65 y.o. male with PMH as above.  He has history of hypertension, hyperlipidemia, sleep apnea on CPAP, and prior tobacco use.  He was referred by Dr. Park Liter for bradycardia with first visit 08/16/2019 with primary cardiologist, Dr. Rockey Situ.  He was previously seen at [facility] clinic.  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    10/2017 echo with normal LVSF, mild LVH, trivial AR, MR, TR, PR.   When seen in clinic 08/2019, he was doing well from a cardiac standpoint.  He was bradycardic and asymptomatic.  He was following a weight loss program with weight down 12 pounds through dietary changes.  He denied any anginal symptoms or exercise intolerance.  He did report of a  "nag" in his left chest and wondered if it he should  have additional work-up.  Given a history of lightheaded spells through the summer, he would hold his HCTZ sometimes or take this every other day.  It was noted that if symptoms of lightheadedness persisted, he could cut his amlodipine dose in half. He was not checking his BP at home on a routine basis.  Recommendations were for CT coronary artery calcium score for risk factor stratification.  He was continued on his current medications.    On Friday 05/25/20, he presented to the emergency department with fatigue, recent fever, elevated BP, emesis, disorientation/confusion, and temporary issues with left hand grip.  He reported dizziness x1 week, trouble concentrating, fatigue, emesis, PND, and cold sx.    He felt similar to when he had Covid September 2021.  He was very ill over Super Bowl weekend and actually missed the game.  BP 137/74.  Temperature is 102.3 - 99.5. Per ED notes, he also reported strokelike symptoms and could not get a good grip of his left hand, since resolving.  Home and drive-through [id number] testing negative.    On 05/30/2020, coronary artery calcium score was obtained and showed a coronary artery calcium score of 185, placing him in the 73rd percentile for age and sex max control.  ASA and statin were recommended if no contraindications.  Patchy areas of groundglass were seen in the lungs bilaterally with referral to pulmonology in 3 to 6 months to  assess.  Follow-up high-resolution CT recommended for reassessment.  Aortic atherosclerosis was also noted.  On 06/02/2020, he returned to clinic and asx with bradycardic rates. He was trying to make lifestyle changes with healthy eating and increasing activity.  Recommendations were to start Vascepa 1g BID and decrease to amlodipine 2.5mg  daily. Atorvastatin was increased to 80mg  daily. Echo and Zio were ordered.   3/15 echo showed EF 60-65%, NRWMA, G1DD, mild AI.   Due to pulmonary findings on CT, PFTs performed and showed moderate  restrictive dz without significant broncho-dilator response. ILD was noted to be considered.   4/8 Zio monitor performed and still waiting for official read with minimum HR 37bpm, max 182bpm, and average rate of 54bpm. Predominant rhythm was NSR. 12 SVT runs occurred with the fastest 11 beats and max 182bpm. The longest lasted 14.0 seconds with average rate 114bpm. It was noted some episodes of SVT could be atrial tachycardia with variable block. Ectopy also noted as below.   Today, 07/26/2020, he returns to clinic and notes episodes of elevated blood pressure at home.  He denies any current chest pain but does report current and ongoing "nagging" CP or a pinpoint chest pressure that is located in his upper left chest area.  He has this discomfort at the time of her visit with BP not elevated and has not noted any clear association with pressure.  He reports that he has had this discomfort for 6 to 8 years.  He denies any clear triggers associated with this nagging or pressure; however, he has noticed that the discomfort improves with activity.  The chest pressure is not positional or tender to palpation.  He reports fatigue. He does feel as if the neck occurs more often than it used to, which concerns him.  He does have days where he does not have his pinpoint chest pressure, as well as some periods or long spreads of time without any discomfort.  No reported shortness of breath.  No presyncope or syncope.  No early satiety, PND, abdominal distention reported. He reports cough with yellow phlegm, attributed to the pollen. He reports walking at least 3 times per week (1.2 miles), at which time he notices his chest discomfort improves.  He has recently cut out all sugar, because his A1c was up.  Recent monitor, PFTs, and echo results reviewed.  He has follow-up scheduled with pulmonology.  He is currently undergoing work-up for his anemia with FOBT still pending.  He reports compliance with CPAP.   Home  Medications    Current Outpatient Medications on File Prior to Visit  Medication Sig Dispense Refill  . albuterol (VENTOLIN HFA) 108 (90 Base) MCG/ACT inhaler INHALE 1 TO 2 PUFFS INTO THE LUNGS EVERY 6 HOURS AS NEEDED FOR WHEEZING OR SHORTNESS OF BREATH 18 g 1  . allopurinol (ZYLOPRIM) 300 MG tablet Take 1 tablet (300 mg total) by mouth daily. 90 tablet 1  . amLODipine (NORVASC) 2.5 MG tablet Take 1 tablet (2.5 mg total) by mouth daily. 30 tablet 5  . Ascorbic Acid (VITAMIN C) 1000 MG tablet Take 1,000 mg by mouth daily.    Marland Kitchen aspirin 325 MG tablet Take 325 mg by mouth daily.    Marland Kitchen atorvastatin (LIPITOR) 80 MG tablet Take 1 tablet (80 mg total) by mouth daily. 30 tablet 5  . budesonide-formoterol (SYMBICORT) 160-4.5 MCG/ACT inhaler Inhale 2 puffs into the lungs 2 (two) times daily. 3 each 3  . Cholecalciferol (VITAMIN D3) 25 MCG (1000 UT) CAPS  Take 1 capsule by mouth daily.    . colchicine 0.6 MG tablet Take 1 tablet (0.6 mg total) by mouth daily as needed. 90 tablet 1  . ezetimibe (ZETIA) 10 MG tablet Take 1 tablet (10 mg total) by mouth daily. 90 tablet 1  . gabapentin (NEURONTIN) 300 MG capsule Take 2 capsules (600 mg total) by mouth 3 (three) times daily. 540 capsule 1  . Glucosamine-Chondroit-Biofl-Mn (GLUCOSAMINE CHONDROIT,BIOFLAV, PO) Take 1 capsule by mouth daily.    . hydrochlorothiazide (HYDRODIURIL) 12.5 MG tablet Take 1 tablet (12.5 mg total) by mouth daily. 90 tablet 1  . HYDROcodone-acetaminophen (NORCO/VICODIN) 5-325 MG tablet 1 po tid prn 15 tablet 0  . icosapent Ethyl (VASCEPA) 1 g capsule Take 2 capsules (2 g total) by mouth 2 (two) times daily. 360 capsule 3  . loratadine (CLARITIN) 10 MG tablet Take 1 tablet (10 mg total) by mouth daily. 90 tablet 0  . losartan (COZAAR) 100 MG tablet Take 1 tablet (100 mg total) by mouth daily. 90 tablet 1  . magnesium gluconate (MAGONATE) 500 MG tablet Take 500 mg by mouth daily.    . naproxen (NAPROSYN) 500 MG tablet TAKE ONE TABLET TWICE  DAILY WITH A MEAL 180 tablet 1  . Omega-3 Fatty Acids (FISH OIL) 1200 MG CAPS Take 1 capsule by mouth daily.    Marland Kitchen omeprazole (PRILOSEC) 20 MG capsule Take 1 capsule (20 mg total) by mouth daily. 90 capsule 1  . ondansetron (ZOFRAN) 4 MG tablet Take 1 tablet (4 mg total) by mouth every 8 (eight) hours as needed for nausea or vomiting. 30 tablet 0  . simethicone (MYLICON) 40 VC/9.4WH drops Take 0.6 mLs (40 mg total) by mouth 4 (four) times daily as needed for flatulence. 120 mL 1  . sucralfate (CARAFATE) 1 g tablet TAKE 1 TABLET BY MOUTH FOUR TIMES DAILY WITH MEALS AND AT BEDTIME 120 tablet 1  . tiZANidine (ZANAFLEX) 4 MG tablet Take 4 mg by mouth as needed.    . Vitamin E 134 MG (200 UNIT) TABS Take 1 tablet by mouth daily.    . Zinc 50 MG CAPS Take 1 capsule by mouth daily.     No current facility-administered medications on file prior to visit.    Review of Systems    He denies current chest pain but does report an ongoing history of nagging or pinpoint chest pressure and left upper chest.  He denies palpitations, orthopnea, n, presyncope, syncope, edema, weight gain, or early satiety.  No further emesis or PND.  He denies current shortness of breath at rest.  No signs or symptoms of bleeding.All other systems reviewed and are otherwise negative except as noted above.  Physical Exam    VS:  BP 136/60 (BP Location: Left Arm, Patient Position: Sitting, Cuff Size: Normal)   Pulse (!) 40   Ht 5\' 9"  (1.753 m)   Wt 210 lb (95.3 kg)   BMI 31.01 kg/m  , BMI Body mass index is 31.01 kg/m. GEN: Well nourished, well developed, in no acute distress.  Facemask in place. HEENT: normal. Neck: Supple, no JVD, carotid bruits, or masses. Cardiac: Bradycardic but regular with occasional extrasystole. 2/4 diastolic murmur RUSB. No rubs or gallops. No clubbing, cyanosis.  Mild bilateral nonpitting lower extremity edema.  Radials/DP/PT 2+ and equal bilaterally.  Respiratory:  Respirations regular and  unlabored, clear to auscultation bilaterally. GI: Soft, nontender, nondistended, BS + x 4. MS: no deformity or atrophy. Skin: warm and dry, no rash. Neuro:  Strength and sensation are intact. Psych: Normal affect.  Accessory Clinical Findings    ECG personally reviewed by me today - marked sinus bradycardia at 40 bpm, PR interval 184 ms, QRS 86 ms, QTC 347 ms, poor R wave/conduction in leads III,  no acute ST/T changes from leads able to be visualized on previous EKG.  VITALS Reviewed today   Temp Readings from Last 3 Encounters:  06/19/20 (!) 97.3 F (36.3 C) (Temporal)  06/09/20 97.8 F (36.6 C)  05/26/20 98.9 F (37.2 C) (Oral)   BP Readings from Last 3 Encounters:  07/26/20 136/60  06/19/20 122/74  06/09/20 130/75   Pulse Readings from Last 3 Encounters:  07/26/20 (!) 40  06/19/20 (!) 54  06/09/20 (!) 50    Wt Readings from Last 3 Encounters:  07/26/20 210 lb (95.3 kg)  06/19/20 209 lb (94.8 kg)  06/09/20 209 lb (94.8 kg)     LABS  reviewed today   Lab Results  Component Value Date   WBC 9.7 06/27/2020   HGB 11.2 (L) 06/27/2020   HCT 34.6 (L) 06/27/2020   MCV 95 06/27/2020   PLT 243 06/27/2020   Lab Results  Component Value Date   CREATININE 1.10 06/09/2020   BUN 20 06/09/2020   NA 135 06/09/2020   K 4.1 06/09/2020   CL 97 06/09/2020   CO2 19 (L) 06/09/2020   Lab Results  Component Value Date   ALT 18 05/02/2020   AST 18 05/02/2020   ALKPHOS 62 05/02/2020   BILITOT <0.2 05/02/2020   Lab Results  Component Value Date   CHOL 216 (H) 05/02/2020   HDL 55 05/02/2020   LDLCALC 110 (H) 05/02/2020   TRIG 301 (H) 05/02/2020   CHOLHDL 3.3 03/03/2018    Lab Results  Component Value Date   HGBA1C 6.9 (H) 06/02/2020   Lab Results  Component Value Date   TSH 1.560 06/02/2020     STUDIES/PROCEDURES reviewed today   Zio monitor 07/14/20 Patch Wear Time:  14 days and 0 hours (2022-03-15T09:20:08-398 to 2022-03-29T09:20:12-398) Patient had a min  HR of 37 bpm, max HR of 182 bpm, and avg HR of 54 bpm. Predominant underlying rhythm was Sinus Rhythm. 12 Supraventricular Tachycardia runs occurred, the run with the fastest interval lasting 11 beats with a max rate of 182 bpm, the  longest lasting 14.0 secs with an avg rate of 114 bpm. Some episodes of Supraventricular Tachycardia may be possible Atrial Tachycardia with variable block. Isolated SVEs were rare (<1.0%), SVE Couplets were rare (<1.0%), and SVE Triplets were rare  (<1.0%). Isolated VEs were rare (<1.0%, 113), VE Couplets were rare (<1.0%, 3), and VE Triplets were rare (<1.0%, 1).  Echo 06/20/20 1. Left ventricular ejection fraction, by estimation, is 60 to 65%. The  left ventricle has normal function. The left ventricle has no regional  wall motion abnormalities. Left ventricular diastolic parameters are  consistent with Grade I diastolic  dysfunction (impaired relaxation). The average left ventricular global  longitudinal strain is -22.6 %. The global longitudinal strain is normal.  2. Right ventricular systolic function is normal. The right ventricular  size is normal. Tricuspid regurgitation signal is inadequate for assessing  PA pressure.  3. The aortic valve is normal in structure. Aortic valve regurgitation is  mild.    CT cardiac scoring 05/30/2020 FINDINGS: Non-cardiac: See separate report from [facility]. Ascending Aorta: Normal size Pericardium: Normal Coronary arteries: Normal origin of left and right coronary arteries. Distribution of arterial calcifications  if present, as noted below; LM 0 LAD 85 LCx 4.23 RCA 95.5 Total 185 IMPRESSION: 1. Coronary calcium score of 185 . This was 73rd percentile for age and sex matched control. 2.  Recommend asa and statin if no contraindications IMPRESSION: 1. The appearance of the lungs suggests resolving multilobar bilateral pneumonia, likely with peripheral areas of post infectious or inflammatory  scarring. Outpatient referral to pulmonology for further clinical evaluation is recommended. In 3-6 months to assess follow-up high-resolution chest CT is recommended for temporal changes in the appearance of the lung parenchyma. 2.  Aortic Atherosclerosis (ICD10-I70.0).   Assessment & Plan    Sinus bradycardia PSVT, Atrial tachycardia --Fatigue with HR 40 bpm and new monitor results with rates into the 180s. Fatigue new from previous visit with report of elevated BP since decrease of amlodipine.  Previous EKGs show sinus bradycardia, asx at that time.  Denies any presyncope or syncope.  No evidence of high-grade heart block on EKG or preliminary review of Zio --> still awaiting formal read by primary cardiologist. Given bradycardic rate and pSVT and pAT with variable block, recommend EP referral. Of note, pt is currently undergoing anemia workup with FOBT pending.  Recent echo as above with EF nl and NRWMA; however, given his fatigue and ongoing atypical CP, recommend coronary CTA at RTC if ongoing sx. Will refer to EP today and await formal read from primary cardiologist. Given tachycardia and bradycardia with pt reporting some of bradycardic rates today corresponded with exertion, EP referral needed at this time.   Atypical chest pain  CAC score 185 (05/2020) --Current and ongoing nagging feeling in his chest. CP has atypical features as previously described, including pinpoint sx and improvement with any exertion; however, pt also does have RF as previously noted and including hyperlipidemia with LDL above goal, known CAC/aortic atherosclerosis, hypertension, elevated A1C, prior history of smoking.  EKG today without acute ST/T changes. CAC score 185, placing him in the 73rd percentile for age and sex matched control; however, full coronary CT was not performed.  Continue ASA 81 mg qd with Prilosec, statin, Zetia, reduced dose amlodipine, and losartan 100 mg daily. Ongoing dietary and lifestyle  changes. If progressive CP at RTC, recommend full coronary CTA over MPI at that time and given previous CAC score. Continue follow-up with pulmonology, given this could also contribute to atypical CP sx. EP referral for AT and bradycardia as above and periodic echo as below. Workup for anemia per PCP also stressed, given this will be important going forward.   Dizziness/vertigo --No current dizziness or vertigo. Long history of vertigo, which had improved with medication changes.  Recent Zio and echo as above. No bruit heard on exam and no report of amaurosis fugax.  Will continue to defer carotids.  Monitor BP at home.   Valvular disease, mild --Most recent echo as above with mild AI. Diastolic murmur appreciated on exam as above and more pronounced than previous visits. As above, continue to monitor with periodic echo. HR and BP control recommended. Coronary CTA recommended as above, which of note, would also allow for possible better visualization of aortic valve to rule out bicuspid valve going forward.   Aortic atherosclerosis (CT scan 2/22) --Aortic atherosclerosis seen on CT.  Continue ASA and increased statin.  Risk factor modification, including glycemic control. Recheck lipids at next visit.   Essential hypertension, goal BP 130/80 or lower --SBP control borderline after amlodipine decreased to 2.5 mg daily at last visit.  Continue losartan  100 mg daily. He reports that he takes 5mg  at home if BP elevated at this time. Continue. Consider Imdur at RTC.  Hyperlipidemia, LDL goal below 70 Hypertriglyceridemia -Not well controlled. 04/2020 cholesterol 216 with triglycerides 301 and LDL 110.   Continue statin with Zetia 10 mg daily.  Most recent 04/2020 LFTs WNL.  Repeat lipid and liver function in 6 to 8 weeks.  If LDL still not below 70, consider Praluent at that time.  Started on Vascepa 1 g twice daily at last visit for hypertriglyceridemia given triglycerides of 301.  Tolerating this well.  Reassess triglycerides at repeat labs in 6 to 8 weeks (next visit).  Obstructive sleep apnea on CPAP --Reports CPAP compliance.  Ongoing compliance encouraged.  CT post infectious or inflammatory scarring (CT scan, 05/2020) -Outpatient referral to pulmonology provided at past visit for further clinical evaluation as outlined in his CT report.  PFTs performed with restrictive dz. CT ordered, pending.  Consider this is contributing to his symptoms.  Discussed limiting albuterol doses, if possible, given this can exacerbate rates and BP.  History of COVID-19 infection --Reports 02/2020 COVID-19 infection.  Consider as contributing to pulmonary findings on CT, as well as his most recent symptoms.  Continue to follow with pulmonology.  Former smoker -No current tobacco use.  Ongoing smoking cessation encouraged.  Medication changes: None Labs ordered: None.  Lipids and liver function in 6 to 8 weeks --> recheck at next visit. Studies / Imaging ordered: None Future considerations: If ongoing atypical CP or calls with progressive episodes, coronary CTA recommended over MPI (also, better assessment of valve).  Disposition: RTC after EP  visit    Arvil Chaco, PA-C 07/26/2020

## 2020-07-25 NOTE — Telephone Encounter (Signed)
Left detailed message for patient with Dr.Johnson's recommendations. Advised patient to give our office a call back if he has any questions or concerns regarding the order.

## 2020-07-25 NOTE — Progress Notes (Incomplete)
Office Visit    Patient Name: Daniel Cook [facility] Date of Encounter: 07/25/2020  PCP:  Valerie Roys DO   [place] Cardiologist:  Ida Rogue, MD  Advanced Practice Provider:  No care team member to display Electrophysiologist:  None   Chief Complaint    No chief complaint on file.   65 year old male with history of hypertension, hyperlipidemia, asymptomatic sinus bradycardia, previous dizziness/history of dizziness resolving with medication changes, trivial AR/MR/TR/PR (2019 echo), aortic atherosclerosis by recent 05/2020 CT, CAC score 185 x 05/2020 CT, sleep apnea on CPAP, prior COVID-19 infection 02/2020, prior tobacco use, gout, and here today for follow-up after recent CAC score.  Past Medical History    Past Medical History:  Diagnosis Date  . COVID-19 12/2019  . Gout   . High cholesterol   . Hypertension   . Obesity   . Sleep apnea   . Vertigo    Past Surgical History:  Procedure Laterality Date  . knee surgeries      Allergies  No Known Allergies  History of Present Illness    Daniel Cook is a 65 y.o. male with PMH as above.  He has history of hypertension, hyperlipidemia, sleep apnea on CPAP, and prior tobacco use.  He was referred by Dr. Park Liter for bradycardia with first visit 08/16/2019 with primary cardiologist, Dr. Rockey Situ.  He was previously seen at [facility] clinic.  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    10/2017 echo with normal LV SF, mild LVH, trivial AR, MR, TR, PR.   When last seen in clinic 08/2019, he was doing well from a cardiac standpoint.  He was bradycardic and asymptomatic.  He was following a weight loss program with weight down 12 pounds through dietary changes.  He denied any anginal symptoms or exercise intolerance.  He did report of a  "nag" in his left chest and wondered if it he should have additional work-up.  Given a history of lightheaded spells through the summer, he would hold his HCTZ sometimes  or take this every other day.  It was noted that if symptoms of lightheadedness persisted, he could cut his amlodipine dose in half. He was not checking his BP at home on a routine basis.  Recommendations were for CT coronary artery calcium score for risk factor stratification.  He was continued on his current medications.    On Friday 05/25/20, he presented to the emergency department with fatigue, recent fever, elevated BP, emesis, disorientation/confusion, and temporary issues with left hand grip.  He reported dizziness x1 week.  He was very ill over Super Bowl weekend and actually missed the game.  BP 137/74.  Temperature is 102.3 and 101.8.  Lowest temperature was 99.5.  He was very fatigued.  Per review of ED note, he also felt as if his eyes were burning and reported headache, nasal congestion, and cough with thick mucus.  Today, he reported that he felt similar over Super Bowl weekend to when he had Covid September 2021.  On Sunday night, he had an episode of emesis.  On Wednesday, he felt disoriented and dizzy with trouble concentrating.  He also reported and episode where he woke up in the middle the night and felt as if he was not breathing well.  Per ED notes, he also reported strokelike symptoms and could not get a good grip of his left hand.  This had since improved and not recurred.  Per ED notes, he had taken  a COVID-19 home test with results negative.  Today, he reports he also went through a drive-through [id number] testing area on Tuesday with results negative.  Per ED notes, his fever resolved on Tuesday.  On Friday, considering his ongoing symptoms, he presented to the ED.  Work-up was unrevealing with recommendation to follow-up as an outpatient with cardiology.  Since that time, he has reported improvement in symptoms.  On 05/30/2020, coronary artery calcium score was obtained and showed a coronary artery calcium score of 185, placing him in the 73rd percentile for age and sex max control.   ASA and statin were recommended if no contraindications.  Patchy areas of groundglass were seen in the lungs bilaterally.  This was suggestive of resolving multilobar pneumonia.  Recommendation was for outpatient referral to pulmonology in 3 to 6 months to assess.  Follow-up high-resolution CT recommended for reassessment.  Aortic atherosclerosis was also noted.  On 06/02/2020, he returned to clinic and asx with bradycardic rates. He was trying to make lifestyle changes with healthy eating and increasing activity.  Recommendations were to start Vascepa 1g BID and decrease to amlodipine 2.5mg  daily. Atorvastatin was increased to 80mg  daily. Echo and Zio were ordered. It was noted that, at RTC, carotids could be considreed  Home Medications    Current Outpatient Medications on File Prior to Visit  Medication Sig Dispense Refill  . albuterol (VENTOLIN HFA) 108 (90 Base) MCG/ACT inhaler INHALE 1 TO 2 PUFFS INTO THE LUNGS EVERY 6 HOURS AS NEEDED FOR WHEEZING OR SHORTNESS OF BREATH 18 g 1  . allopurinol (ZYLOPRIM) 300 MG tablet Take 1 tablet (300 mg total) by mouth daily. 90 tablet 1  . amLODipine (NORVASC) 2.5 MG tablet Take 1 tablet (2.5 mg total) by mouth daily. 30 tablet 5  . Ascorbic Acid (VITAMIN C) 1000 MG tablet Take 1,000 mg by mouth daily.    Marland Kitchen aspirin 325 MG tablet Take 325 mg by mouth daily.    Marland Kitchen atorvastatin (LIPITOR) 80 MG tablet Take 1 tablet (80 mg total) by mouth daily. 30 tablet 5  . budesonide-formoterol (SYMBICORT) 160-4.5 MCG/ACT inhaler Inhale 2 puffs into the lungs 2 (two) times daily. 3 each 3  . Cholecalciferol (VITAMIN D3) 25 MCG (1000 UT) CAPS Take 1 capsule by mouth daily.    . colchicine 0.6 MG tablet Take 1 tablet (0.6 mg total) by mouth daily as needed. 90 tablet 1  . doxycycline (VIBRA-TABS) 100 MG tablet Take 1 tablet (100 mg total) by mouth 2 (two) times daily. 20 tablet 0  . ezetimibe (ZETIA) 10 MG tablet Take 1 tablet (10 mg total) by mouth daily. 90 tablet 1  .  gabapentin (NEURONTIN) 300 MG capsule Take 2 capsules (600 mg total) by mouth 3 (three) times daily. 540 capsule 1  . Glucosamine-Chondroit-Biofl-Mn (GLUCOSAMINE CHONDROIT,BIOFLAV, PO) Take 1 capsule by mouth daily.    . hydrochlorothiazide (HYDRODIURIL) 12.5 MG tablet Take 1 tablet (12.5 mg total) by mouth daily. 90 tablet 1  . HYDROcodone-acetaminophen (NORCO/VICODIN) 5-325 MG tablet 1 po tid prn 15 tablet 0  . icosapent Ethyl (VASCEPA) 1 g capsule Take 2 capsules (2 g total) by mouth 2 (two) times daily. 360 capsule 3  . loratadine (CLARITIN) 10 MG tablet Take 1 tablet (10 mg total) by mouth daily. 90 tablet 0  . losartan (COZAAR) 100 MG tablet Take 1 tablet (100 mg total) by mouth daily. 90 tablet 1  . magnesium gluconate (MAGONATE) 500 MG tablet Take 500 mg by mouth daily.    Marland Kitchen  naproxen (NAPROSYN) 500 MG tablet TAKE ONE TABLET TWICE DAILY WITH A MEAL 180 tablet 1  . Omega-3 Fatty Acids (FISH OIL) 1200 MG CAPS Take 1 capsule by mouth daily.    Marland Kitchen omeprazole (PRILOSEC) 20 MG capsule Take 1 capsule (20 mg total) by mouth daily. 90 capsule 1  . ondansetron (ZOFRAN) 4 MG tablet Take 1 tablet (4 mg total) by mouth every 8 (eight) hours as needed for nausea or vomiting. 30 tablet 0  . simethicone (MYLICON) 40 MG/8.6PY drops Take 0.6 mLs (40 mg total) by mouth 4 (four) times daily as needed for flatulence. 120 mL 1  . sucralfate (CARAFATE) 1 g tablet TAKE 1 TABLET BY MOUTH FOUR TIMES DAILY WITH MEALS AND AT BEDTIME 120 tablet 1  . tiZANidine (ZANAFLEX) 4 MG tablet Take 4 mg by mouth as needed.    . Vitamin E 134 MG (200 UNIT) TABS Take 1 tablet by mouth daily.    . Zinc 50 MG CAPS Take 1 capsule by mouth daily.     No current facility-administered medications on file prior to visit.    Review of Systems    ***He denies current chest pain but does report a history of nagging feeling in his chest as outlined in previous visits.  He denies palpitations, orthopnea, n, syncope, edema, weight gain, or  early satiety.  He reports one episode of emesis over Super Bowl weekend.  He reports one episode of PND over Super Bowl weekend/associated with this illness.  He reports previous dizziness associated with his illness and in the past as outlined in previous notes but not current.  He denies current shortness of breath at rest.  No signs or symptoms of bleeding.  He reports improvement in congestion/illness symptoms since his presentation to the ED 2/17.   All other systems reviewed and are otherwise negative except as noted above.  Physical Exam   *** VS:  There were no vitals taken for this visit. , BMI There is no height or weight on file to calculate BMI. GEN: Well nourished, well developed, in no acute distress. HEENT: normal. Neck: Supple, no JVD, carotid bruits, or masses. Cardiac: Bradycardic but regular, no murmurs, rubs, or gallops. No clubbing, cyanosis, pitting edema edema.  Radials/DP/PT 2+ and equal bilaterally.  Respiratory:  Respirations regular and unlabored, clear to auscultation bilaterally. GI: Soft, nontender, nondistended, BS + x 4. MS: no deformity or atrophy. Skin: warm and dry, no rash. Neuro:  Strength and sensation are intact. Psych: Normal affect.  Accessory Clinical Findings    ECG personally reviewed by me today -***marked sinus bradycardia at 42 bpm, PR interval 176 ms, QRS 80 ms, QTC 354 ms, poor R wave/conduction in leads III,  no acute ST/T changes- no acute changes.  VITALS Reviewed today   Temp Readings from Last 3 Encounters:  06/19/20 (!) 97.3 F (36.3 C) (Temporal)  06/09/20 97.8 F (36.6 C)  05/26/20 98.9 F (37.2 C) (Oral)   BP Readings from Last 3 Encounters:  06/19/20 122/74  06/09/20 130/75  06/02/20 110/60   Pulse Readings from Last 3 Encounters:  06/19/20 (!) 54  06/09/20 (!) 50  06/02/20 (!) 45    Wt Readings from Last 3 Encounters:  06/19/20 209 lb (94.8 kg)  06/09/20 209 lb (94.8 kg)  06/02/20 213 lb (96.6 kg)     LABS   reviewed today   Lab Results  Component Value Date   WBC 9.7 06/27/2020   HGB 11.2 (L) 06/27/2020  HCT 34.6 (L) 06/27/2020   MCV 95 06/27/2020   PLT 243 06/27/2020   Lab Results  Component Value Date   CREATININE 1.10 06/09/2020   BUN 20 06/09/2020   NA 135 06/09/2020   K 4.1 06/09/2020   CL 97 06/09/2020   CO2 19 (L) 06/09/2020   Lab Results  Component Value Date   ALT 18 05/02/2020   AST 18 05/02/2020   ALKPHOS 62 05/02/2020   BILITOT <0.2 05/02/2020   Lab Results  Component Value Date   CHOL 216 (H) 05/02/2020   HDL 55 05/02/2020   LDLCALC 110 (H) 05/02/2020   TRIG 301 (H) 05/02/2020   CHOLHDL 3.3 03/03/2018    Lab Results  Component Value Date   HGBA1C 6.9 (H) 06/02/2020   Lab Results  Component Value Date   TSH 1.560 06/02/2020     STUDIES/PROCEDURES reviewed today   Zio monitor 07/14/20 Patch Wear Time:  14 days and 0 hours (2022-03-15T09:20:08-398 to 2022-03-29T09:20:12-398) Patient had a min HR of 37 bpm, max HR of 182 bpm, and avg HR of 54 bpm. Predominant underlying rhythm was Sinus Rhythm. 12 Supraventricular Tachycardia runs occurred, the run with the fastest interval lasting 11 beats with a max rate of 182 bpm, the  longest lasting 14.0 secs with an avg rate of 114 bpm. Some episodes of Supraventricular Tachycardia may be possible Atrial Tachycardia with variable block. Isolated SVEs were rare (<1.0%), SVE Couplets were rare (<1.0%), and SVE Triplets were rare  (<1.0%). Isolated VEs were rare (<1.0%, 113), VE Couplets were rare (<1.0%, 3), and VE Triplets were rare (<1.0%, 1).  Echo 06/20/20 1. Left ventricular ejection fraction, by estimation, is 60 to 65%. The  left ventricle has normal function. The left ventricle has no regional  wall motion abnormalities. Left ventricular diastolic parameters are  consistent with Grade I diastolic  dysfunction (impaired relaxation). The average left ventricular global  longitudinal strain is -22.6 %. The  global longitudinal strain is normal.  2. Right ventricular systolic function is normal. The right ventricular  size is normal. Tricuspid regurgitation signal is inadequate for assessing  PA pressure.  3. The aortic valve is normal in structure. Aortic valve regurgitation is  mild.    CT cardiac scoring 05/30/2020 FINDINGS: Non-cardiac: See separate report from [facility]. Ascending Aorta: Normal size Pericardium: Normal Coronary arteries: Normal origin of left and right coronary arteries. Distribution of arterial calcifications if present, as noted below; LM 0 LAD 85 LCx 4.23 RCA 95.5 Total 185 IMPRESSION: 1. Coronary calcium score of 185 . This was 73rd percentile for age and sex matched control. 2.  Recommend asa and statin if no contraindications IMPRESSION: 1. The appearance of the lungs suggests resolving multilobar bilateral pneumonia, likely with peripheral areas of post infectious or inflammatory scarring. Outpatient referral to pulmonology for further clinical evaluation is recommended. In 3-6 months to assess follow-up high-resolution chest CT is recommended for temporal changes in the appearance of the lung parenchyma. 2.  Aortic Atherosclerosis (ICD10-I70.0).   Assessment & Plan    Asymptomatic sinus bradycardia --Asymptomatic bradycardia with rate today 45 bpm.  Previous EKGs show sinus bradycardia as well.  Denies any presyncope or syncope with lower HR.  No evidence of high-grade heart block on EKG or on review of EMR/HPI.  Given bradycardic rate, avoid BB/AV nodal blocking agents.  Decreased to amlodipine 2.5 mg daily.  He will monitor his vitals at home.  Ordered 2-week Zio XT for further cardiac ambulatory monitoring and  to determine minimum heart rate and to further evaluate if any evidence of high-grade block or arrhythmia.  This will also allow [place] to rule out dizziness due to arrhythmia or ectopy.  In addition, we will update an echo.   Previous 2019 echo showed normal EF.  We will check labs today, including BMET/magnesium to reassess electrolytes.  Check TSH/thyroid function.  Previous TSH from 04/2020 was 1.530 and WNL.  Further recommendations pending labs and work-up.  He will call the office if any dizziness or syncope.   Atypical chest pain  CAC score 118 (05/2020) --No current chest pain.  Previous report of a a nagging feeling in his chest at his recent [place] visit, at which point cardiac CT was ordered.  Today, he describes this nagging as ongoing and as an occasional feeling without clear exacerbating or alleviating factors.  No associated symptoms.  2019 echo with normal LV SF and trivial AR, MR, TR, PR.  Cardiac CT performed 05/2020 with coronary calcium score of 185, placing him in the 73rd percentile for age and sex matched control.  Risk factors for CAD include hyperlipidemia with LDL above goal, known CAC/aortic atherosclerosis, hypertension, prior history of smoking.  Risk factor modification recommended.  Recommend medical management.  Discussed recommendation for ASA 81 mg daily.  Medication list has ASA 325 mg daily with patient report that he will go home and check the exact milligram of his current ASA and reduce to 81 mg daily if needed to prevent bleeding.  Continue Prilosec.  Will check CBC.  Statin dose increased today for goal LDL below 70.  Continue atorvastatin 80 mg daily with Zetia 10 mg daily.  Continue losartan 100 mg daily.  Will check A1c for further risk factor stratification.  Avoid beta-blocker/AV nodal blocking agents given sinus bradycardia.  Decreased his amlodipine to 2.5 mg daily.  Encouraged ongoing dietary and lifestyle changes.  Ongoing weight loss encouraged.  Encouraged increased activity.  Dizziness/vertigo --No current dizziness or vertigo.  He reports a long history of vertigo, which had improved with medication changes.  He had recurrent dizziness with his most recent illness over  Super Bowl weekend.  He wonders the extent to which dehydration contributes to his dizziness.  Today, he has a bradycardic rate with soft BP.  Decreased to amlodipine 2.5 mg daily.  Avoid AV nodal blocking agents.  Recommend Zio XT x2 weeks ordered today for further evaluation of his rate and rhythm given bradycardia and dizziness.  Update 2019 echo to reassess EF, valves, heart pressures, and rule out any wall motion abnormalities or acute structural changes.  Previous echo showed normal EF with mild AR, MR, TR, PR.  Considered were bilateral carotid studies given dizziness; however, no bruit heard on exam and no report of amaurosis fugax.  We will defer carotids and reassess indication for carotid ultrasounds at RTC and if work-up as above unrevealing.  Labs-TSH, BMET, magnesium, and CBC.  Reviewed signs and symptoms of bleeding.  If potassium remains low, recommend discontinue HCTZ +/-Spironolactone as directly below.  Monitor BP at home.  Call the office of hypotension.  H/o hypokalemia --2/18 labs show potassium 3.4.  Goal potassium 4.0.  Repeat BMET today to reassess potassium.  Check magnesium.  Replete electrolytes to avoid arrhythmia.  Consider abnormal electrolytes as contributing to his symptoms described above, including his dizziness.  If K+ still below 4.0, recommend discontinue HCTZ and start spironolactone in its place.  Further recommendations pending results of labs.  Valvular disease, mild --No current dizziness.  2019 echo with mild AR, MR, TR, PR.  No significant murmurs on cardiac exam today.  He reports dizziness in the setting of his recent illness and as described in HPI above.  Since recovering from his illness over Super Bowl weekend, no further dizziness.  Aortic atherosclerosis (CT scan 2/22) --Aortic atherosclerosis seen on CT.  Continue ASA and statin.  Risk factor modification.  Essential hypertension, goal BP 130/80 or lower --BP well controlled to soft today.  No  further episodes of dizziness since the ED.  He wonders the extent to which dehydration may contribute to lower pressures and dizziness.  Hydration encouraged.  He continues on HCTZ with recommendation to discontinue if potassium remains low on repeat BMET.  If potassium remains low and HCTZ discontinued, spironolactone can be considered given it is potassium sparing diuretic.  Reassess following repeat BMET. Amlodipine decreased to 2.5 mg daily.  Continue losartan 100 mg daily.  He will monitor his BP at home.  Hyperlipidemia, LDL goal below 70 Hypertriglyceridemia -04/2020 cholesterol 216 with triglycerides 301 and LDL 110.  Recommend increased dose statin to atorvastatin 80 mg daily today, given most recent LDL not at goal of below 70.  Continue with Zetia 10 mg daily.  Most recent 04/2020 LFTs WNL.  Repeat lipid and liver function in 6 to 8 weeks.  If LDL still not below 70, consider Praluent at that time.  Started on Vascepa 1 g twice daily for hypertriglyceridemia given triglycerides of 301.  Reassess triglycerides at repeat labs in 6 to 8 weeks.  Discussed PCSK9 inhibitors and lipid clinic today-we will defer at this time with preference to attempt the above medication changes for control of LDL and TG.  Obstructive sleep apnea on CPAP --Reports CPAP compliance.  Ongoing compliance encouraged.  CT post infectious or inflammatory scarring (CT scan, 05/2020) -Outpatient referral to pulmonology provided for further clinical evaluation as outlined in his CT report.  CT report also specifies repeat CT in 3 to 6 months for temporal changes.  Consider this is contributing to his symptoms.  Referral provided to pulmonology today.  Continue current albuterol.  History of COVID-19 infection --Reports 02/2020 COVID-19 infection.  Consider as contributing to pulmonary findings on CT, as well as his most recent symptoms.  Referral provided to pulmonology as indicated in his CT report.  Recent febrile  illness Leukocytosis --Denies further fever.  Febrile illness over Super Bowl weekend as above.  2/18 labs show leukocytosis with WBC 10.7.Marland Kitchen  Follow-up per PCP.  Former smoker -No current tobacco use.  Ongoing smoking cessation encouraged.  Medication management --Current medication list reports that he takes 325 mg of aspirin daily.  Recommendation was for 81 mg of aspirin daily unless recommended by neurologist and to prevent the risk of bleeding.  He will go home and check to see his dose of ASA and decrease to 81 mg daily if needed.  Reviewed the signs and symptoms of bleeding with patient understanding.  He denies any current signs or symptoms of bleeding.  Continue omeprazole.  Medication changes: Start Vascepa 1 g twice daily.  Decrease to amlodipine 2.5 mg daily.  Increase to atorvastatin 80 mg daily. Labs ordered: BMET, magnesium, CBC, A1c, TSH.  Lipids and liver function in 6 to 8 weeks. Studies / Imaging ordered: Echo, Zio XT x2 weeks Future considerations: Carotids.  If EF low, further ischemic work-up.  If Zio indicates, EP referral.  If potassium remains low, discontinue HCTZ.  In its place, could consider spironolactone if BP support needed after HCTZ continued. Disposition: RTC 4 weeks    Arvil Chaco, PA-C 07/25/2020

## 2020-07-25 NOTE — Telephone Encounter (Signed)
If he's talking about his FOBT there's an order in for it already- he just needs to drop it off here.

## 2020-07-25 NOTE — Telephone Encounter (Signed)
Copied from [place] [id number]. Topic: General - Inquiry >> Jul 25, 2020  2:29 PM Greggory Keen D wrote: Reason for CRM: Pt called saying he picked a stool sample kit from the office but he read it and it said he should have gotten the order form from the office and he says he did not receive an order form  CB#  [phone] or [phone]

## 2020-07-26 ENCOUNTER — Other Ambulatory Visit: Payer: Self-pay

## 2020-07-26 ENCOUNTER — Other Ambulatory Visit: Payer: BC Managed Care – PPO

## 2020-07-26 ENCOUNTER — Ambulatory Visit (INDEPENDENT_AMBULATORY_CARE_PROVIDER_SITE_OTHER): Payer: BC Managed Care – PPO | Admitting: Physician Assistant

## 2020-07-26 ENCOUNTER — Encounter: Payer: Self-pay | Admitting: Physician Assistant

## 2020-07-26 VITALS — BP 136/60 | HR 40 | Ht 69.0 in | Wt 210.0 lb

## 2020-07-26 DIAGNOSIS — I38 Endocarditis, valve unspecified: Secondary | ICD-10-CM

## 2020-07-26 DIAGNOSIS — I10 Essential (primary) hypertension: Secondary | ICD-10-CM

## 2020-07-26 DIAGNOSIS — R001 Bradycardia, unspecified: Secondary | ICD-10-CM

## 2020-07-26 DIAGNOSIS — Z9989 Dependence on other enabling machines and devices: Secondary | ICD-10-CM

## 2020-07-26 DIAGNOSIS — I4719 Other supraventricular tachycardia: Secondary | ICD-10-CM

## 2020-07-26 DIAGNOSIS — I471 Supraventricular tachycardia, unspecified: Secondary | ICD-10-CM

## 2020-07-26 DIAGNOSIS — R931 Abnormal findings on diagnostic imaging of heart and coronary circulation: Secondary | ICD-10-CM

## 2020-07-26 DIAGNOSIS — D649 Anemia, unspecified: Secondary | ICD-10-CM | POA: Diagnosis not present

## 2020-07-26 DIAGNOSIS — G4733 Obstructive sleep apnea (adult) (pediatric): Secondary | ICD-10-CM

## 2020-07-26 DIAGNOSIS — E785 Hyperlipidemia, unspecified: Secondary | ICD-10-CM | POA: Diagnosis not present

## 2020-07-26 DIAGNOSIS — Z8616 Personal history of COVID-19: Secondary | ICD-10-CM

## 2020-07-26 DIAGNOSIS — Z87891 Personal history of nicotine dependence: Secondary | ICD-10-CM

## 2020-07-26 DIAGNOSIS — I700 Atherosclerosis of aorta: Secondary | ICD-10-CM

## 2020-07-26 NOTE — Patient Instructions (Addendum)
Medication Instructions:  Your physician recommends that you continue on your current medications as directed. Please refer to the Current Medication list given to you today.  *If you need a refill on your cardiac medications before your next appointment, please call your pharmacy*   Lab Work: None ordered  Testing/Procedures: None ordered   Follow-Up: At [facility], you and your health needs are our priority.  As part of our continuing mission to provide you with exceptional heart care, we have created designated Provider Care Teams.  These Care Teams include your primary Cardiologist (physician) and Advanced Practice Providers (APPs -  Physician Assistants and Nurse Practitioners) who all work together to provide you with the care you need, when you need it.  We recommend signing up for the patient portal called "MyChart".  Sign up information is provided on this After Visit Summary.  MyChart is used to connect with patients for Virtual Visits (Telemedicine).  Patients are able to view lab/test results, encounter notes, upcoming appointments, etc.  Non-urgent messages can be sent to your provider as well.   To learn more about what you can do with MyChart, go to [email or web address].    Your next appointment:    Follow up after EP consult  The format for your next appointment:   In Person  Provider:   You may see Ida Rogue, MD or one of the following Advanced Practice Providers on your designated Care Team:     Marrianne Mood, PA-C   Other Instructions You have been referred to Electrophysiology

## 2020-07-27 ENCOUNTER — Ambulatory Visit
Admission: RE | Admit: 2020-07-27 | Discharge: 2020-07-27 | Disposition: A | Discharge status: Home / Self Care | Payer: BC Managed Care – PPO | Source: Ambulatory Visit | Attending: Primary Care | Admitting: Primary Care

## 2020-07-27 ENCOUNTER — Ambulatory Visit: Payer: BC Managed Care – PPO

## 2020-07-27 DIAGNOSIS — Z01818 Encounter for other preprocedural examination: Secondary | ICD-10-CM | POA: Diagnosis not present

## 2020-07-27 DIAGNOSIS — Z8616 Personal history of COVID-19: Secondary | ICD-10-CM | POA: Diagnosis not present

## 2020-07-27 LAB — FECAL OCCULT BLOOD, IMMUNOCHEMICAL: Fecal Occult Bld: POSITIVE — AB

## 2020-07-27 NOTE — Telephone Encounter (Signed)
Beth I have spoke to [place] about this patient and since Aaron Edelman nor Rexene Edison or Judson Roch are available would you be willing to order a CXR for this patient to see if we can get his CT approved that is scheduled on 4/26

## 2020-07-27 NOTE — Telephone Encounter (Signed)
Yes, I placed an order for CXR re: personal hx Covid -19.

## 2020-07-27 NOTE — Telephone Encounter (Signed)
I have spoke with Mr. Heppler and he is going to try and get by today to do chest xray

## 2020-07-28 ENCOUNTER — Other Ambulatory Visit: Payer: Self-pay | Admitting: Family Medicine

## 2020-07-28 DIAGNOSIS — R195 Other fecal abnormalities: Secondary | ICD-10-CM

## 2020-07-28 DIAGNOSIS — D649 Anemia, unspecified: Secondary | ICD-10-CM

## 2020-07-28 NOTE — Progress Notes (Signed)
CXR showed mild scarring vs infiltrate. Daniel Cook ordered HRCT, this will need to be done.

## 2020-07-31 ENCOUNTER — Encounter: Payer: Self-pay | Admitting: *Deleted

## 2020-07-31 NOTE — Telephone Encounter (Signed)
Central scheduling CXL the CT appt because we couldn't get the CT approved. Insurance stated Peer to Peer is the only thing that can be done now that the case has been closed.

## 2020-08-01 ENCOUNTER — Ambulatory Visit: Payer: BC Managed Care – PPO

## 2020-08-01 NOTE — Progress Notes (Signed)
I think they required a repeat CXR prior to them approving HRCT, that's the sense I got from [place]. Abnormal CXR should work for that. I'll forward to Aaron Edelman to see if he still wants CT imaging and if so what he wants dx code to be.

## 2020-08-01 NOTE — Progress Notes (Signed)
Daniel Cook had asked me to order CXR cause HRCT that Daniel Cook ordered has been denied. Can we re-order HRCT re: abnormal CXR and get CT imaging approved?

## 2020-08-01 NOTE — Progress Notes (Signed)
Called and went over xray results per E. Volanda Napoleon NP with patient. Patient expressed full understanding but stated that he was supposed to have the HRCT today (08/01/20) but it was canceled due to insurance not covering it. Routing to [place] NP to make aware.

## 2020-08-01 NOTE — Progress Notes (Signed)
Beth, it looks like these were the dx used when Aaron Edelman order the HRCT that was denied:  Abnormal findings on diagnostic imaging of lung [R91.8] - Primary   Personal history of COVID-19 [Z86.16]

## 2020-08-01 NOTE — Progress Notes (Signed)
Called and left message on voicemail to please return phone call to go over xray results. Contact number provided.

## 2020-08-02 ENCOUNTER — Telehealth: Payer: Self-pay | Admitting: Internal Medicine

## 2020-08-02 NOTE — Telephone Encounter (Signed)
Dr. Mortimer Fries, Wyn Quaker, NP saw this patient on 06/19/20 as sleep consult and pulmonary consult post covid.  Patient had Cardial CT done 05/2020 recommended HRCT in 3-6 months.  Aaron Edelman ordered the CT to be done before the patient came back to see you.  Insurance wanted chest xray to be done. Patient had chest xray done 07/28/20 but the case had already been closed.  The only way to get CT approved now will be to do Peer to Peer.  You have an appt with the patient on 08/08/20.  You can decide if the CT is still needed after you review the chest xray.

## 2020-08-03 NOTE — Progress Notes (Signed)
Per PN dated 08/02/20- Dr Mortimer Fries says he will discuss with pt at his upcoming ov.

## 2020-08-03 NOTE — Telephone Encounter (Signed)
I have left a message about Dr. Zoila Shutter response. He will decide what to do during his appt on 08/08/20

## 2020-08-08 ENCOUNTER — Other Ambulatory Visit: Payer: Self-pay

## 2020-08-08 ENCOUNTER — Ambulatory Visit (INDEPENDENT_AMBULATORY_CARE_PROVIDER_SITE_OTHER): Payer: BC Managed Care – PPO | Admitting: Internal Medicine

## 2020-08-08 ENCOUNTER — Encounter: Payer: Self-pay | Admitting: Internal Medicine

## 2020-08-08 VITALS — BP 144/72 | HR 45 | Temp 97.3°F | Ht 65.0 in | Wt 212.0 lb

## 2020-08-08 DIAGNOSIS — L818 Other specified disorders of pigmentation: Secondary | ICD-10-CM

## 2020-08-08 DIAGNOSIS — J849 Interstitial pulmonary disease, unspecified: Secondary | ICD-10-CM | POA: Diagnosis not present

## 2020-08-08 DIAGNOSIS — U071 COVID-19: Secondary | ICD-10-CM

## 2020-08-08 DIAGNOSIS — Z9989 Dependence on other enabling machines and devices: Secondary | ICD-10-CM

## 2020-08-08 DIAGNOSIS — G4733 Obstructive sleep apnea (adult) (pediatric): Secondary | ICD-10-CM | POA: Diagnosis not present

## 2020-08-08 NOTE — Progress Notes (Signed)
@Patient  ID: Daniel Cook, male    DOB: [birth year], 65 y.o.   MRN: [id number]   SYNOPSIS 65 year old male former smoker initially referred to our office on 06/19/2020 for sleep consult as well as post COVID-19  PMH: There hypertension, hyperlipidemia, gout, history of COVID-19, aortic arthrosclerosis, type 2 diabetes Smoke 40 pack smoker.  Maintenance: Symbicort 160 DX COVID-19 in September/2021.  He was treated in the outpatient setting.  Did not require hospitalization.  Patient did not receive monoclonal antibody infusion.  He was unvaccinated.  Patient remains unvaccinated.  Patient reports that it took about 1.5 months to get back to his baseline.  2015 PSG--AHI 27 Supine/REM   CC follow up OSA Follow up tobacco abuse   HPI   08/08/2020  - Visit  Former smoker Current smokeless tobacco user Follow-up OSA Patient with excellent compliance since he has been using his new CPAP machine His compliance is 100% for days and greater than 4 hours AHI has reduced to 1.9 Previous AHI was 27  Post COVID-19 infection 6 months out CT of the chest shows bilateral groundglass opacifications Patient signs and symptoms of reactive airways disease with cough wheezing sputum production Signs and symptoms of chronic bronchitis Patient currently takes Symbicort twice daily Patient uses albuterol inhaler couple times a week Exercise tolerance has increased over the last several months Patient is a 40-pack-year smoking history.  He quit smoking in 2000.  Patient does continue smokeless tobacco.  He is planning on working on stopping.  Patient is due for Pneumovax 23.      Questionaires / Pulmonary Flowsheets:   Epworth:  Results of the Epworth flowsheet 06/19/2020  Sitting and reading 0  Watching TV 1  Sitting, inactive in a public place (e.g. a theatre or a meeting) 1  As a passenger in a car for an hour without a break 0  Lying down to rest in the afternoon when circumstances  permit 1  Sitting and talking to someone 0  Sitting quietly after a lunch without alcohol 0  In a car, while stopped for a few minutes in traffic 0  Total score 3    Imaging: DG Chest 2 View  Result Date: 07/28/2020 CLINICAL DATA:  Preprocedure exam. History of COVID positive on December 16, 2019. EXAM: CHEST - 2 VIEW COMPARISON:  April 10, 2018 FINDINGS: Mild areas of linear scarring, atelectasis and/or infiltrate are seen within the left infrahilar region and along the periphery of the right lung. Mild right-sided volume loss is also noted. There is no evidence of a pleural effusion or pneumothorax. The heart size and mediastinal contours are within normal limits. The visualized skeletal structures are unremarkable. IMPRESSION: Mild bilateral linear scarring, atelectasis and/or infiltrate, as described above. Electronically Signed   By: Virgina Norfolk M.D.   On: 07/28/2020 15:02   LONG TERM MONITOR (3-14 DAYS)  Result Date: 07/30/2020 Event Monitor Patch Wear Time:  14 days and 0 hours Normal sinus rhythm Patient had a min HR of 37 bpm, max HR of 182 bpm, and avg HR of 54 bpm. 12 Supraventricular Tachycardia/atrial tachycardia  runs occurred, the run with the fastest interval lasting 11 beats with a max rate of 182 bpm, the longest lasting 14.0 secs with an avg rate of 114 bpm. Isolated SVEs were rare (<1.0%), SVE Couplets were rare (<1.0%), and SVE Triplets were rare (<1.0%). Isolated VEs were rare (<1.0%, 113), VE Couplets were rare (<1.0%, 3), and VE Triplets were rare (<1.0%, 1). Patient  triggered events (3) were not associated with significant arrhythmia Signed, Esmond Plants, MD, Ph.D [facility]    Lab Results:  CBC    Component Value Date/Time   WBC 9.7 06/27/2020 1537   WBC 10.7 (H) 05/26/2020 2311   RBC 3.65 (L) 06/27/2020 1537   RBC 4.15 (L) 05/26/2020 2311   HGB 11.2 (L) 06/27/2020 1537   HCT 34.6 (L) 06/27/2020 1537   PLT 243 06/27/2020 1537   MCV 95 06/27/2020 1537    MCH 30.7 06/27/2020 1537   MCH 31.6 05/26/2020 2311   MCHC 32.4 06/27/2020 1537   MCHC 33.9 05/26/2020 2311   RDW 13.5 06/27/2020 1537   LYMPHSABS 1.8 06/27/2020 1537   EOSABS 0.2 06/27/2020 1537   BASOSABS 0.1 06/27/2020 1537    BMET    Component Value Date/Time   NA 135 06/09/2020 1645   K 4.1 06/09/2020 1645   CL 97 06/09/2020 1645   CO2 19 (L) 06/09/2020 1645   GLUCOSE 85 06/09/2020 1645   GLUCOSE 108 (H) 05/26/2020 2311   BUN 20 06/09/2020 1645   CREATININE 1.10 06/09/2020 1645   CALCIUM 9.3 06/09/2020 1645   GFRNONAA 85 06/02/2020 1110   GFRNONAA >60 05/26/2020 2311   GFRAA 99 06/02/2020 1110     Specialty Problems      Pulmonary Problems   Sleep apnea    Using CPAP faithfully         No Known Allergies  Immunization History  Administered Date(s) Administered  . Influenza,inj,Quad PF,6+ Mos 12/26/2015, 02/04/2017  . Tdap 12/19/2010    Past Medical History:  Diagnosis Date  . COVID-19 12/2019  . Gout   . High cholesterol   . Hypertension   . Obesity   . Sleep apnea   . Vertigo     Tobacco History: Social History   Tobacco Use  Smoking Status Former Smoker  . Packs/day: 2.00  . Years: 20.00  . Pack years: 40.00  . Types: Cigarettes  . Quit date: 10/04/1998  . Years since quitting: 21.8  Smokeless Tobacco Current User  . Types: Chew  Tobacco Comment   dip occassionally   Ready to quit: Not Answered Counseling given: Not Answered Comment: dip occassionally   Continue to not smoke  Outpatient Encounter Medications as of 08/08/2020  Medication Sig  . albuterol (VENTOLIN HFA) 108 (90 Base) MCG/ACT inhaler INHALE 1 TO 2 PUFFS INTO THE LUNGS EVERY 6 HOURS AS NEEDED FOR WHEEZING OR SHORTNESS OF BREATH  . allopurinol (ZYLOPRIM) 300 MG tablet Take 1 tablet (300 mg total) by mouth daily.  Marland Kitchen amLODipine (NORVASC) 2.5 MG tablet Take 1 tablet (2.5 mg total) by mouth daily.  . Ascorbic Acid (VITAMIN C) 1000 MG tablet Take 1,000 mg by mouth daily.   Marland Kitchen aspirin 325 MG tablet Take 325 mg by mouth daily.  Marland Kitchen atorvastatin (LIPITOR) 80 MG tablet Take 1 tablet (80 mg total) by mouth daily.  . budesonide-formoterol (SYMBICORT) 160-4.5 MCG/ACT inhaler Inhale 2 puffs into the lungs 2 (two) times daily.  . Cholecalciferol (VITAMIN D3) 25 MCG (1000 UT) CAPS Take 1 capsule by mouth daily.  . colchicine 0.6 MG tablet Take 1 tablet (0.6 mg total) by mouth daily as needed.  . ezetimibe (ZETIA) 10 MG tablet Take 1 tablet (10 mg total) by mouth daily.  Marland Kitchen gabapentin (NEURONTIN) 300 MG capsule Take 2 capsules (600 mg total) by mouth 3 (three) times daily.  . Glucosamine-Chondroit-Biofl-Mn (GLUCOSAMINE CHONDROIT,BIOFLAV, PO) Take 1 capsule by mouth daily.  Marland Kitchen  hydrochlorothiazide (HYDRODIURIL) 12.5 MG tablet Take 1 tablet (12.5 mg total) by mouth daily.  Marland Kitchen HYDROcodone-acetaminophen (NORCO/VICODIN) 5-325 MG tablet 1 po tid prn  . icosapent Ethyl (VASCEPA) 1 g capsule Take 2 capsules (2 g total) by mouth 2 (two) times daily.  Marland Kitchen loratadine (CLARITIN) 10 MG tablet Take 1 tablet (10 mg total) by mouth daily.  Marland Kitchen losartan (COZAAR) 100 MG tablet Take 1 tablet (100 mg total) by mouth daily.  . magnesium gluconate (MAGONATE) 500 MG tablet Take 500 mg by mouth daily.  . naproxen (NAPROSYN) 500 MG tablet TAKE ONE TABLET TWICE DAILY WITH A MEAL  . Omega-3 Fatty Acids (FISH OIL) 1200 MG CAPS Take 1 capsule by mouth daily.  Marland Kitchen omeprazole (PRILOSEC) 20 MG capsule Take 1 capsule (20 mg total) by mouth daily.  . ondansetron (ZOFRAN) 4 MG tablet Take 1 tablet (4 mg total) by mouth every 8 (eight) hours as needed for nausea or vomiting.  . simethicone (MYLICON) 40 LO/7.5IE drops Take 0.6 mLs (40 mg total) by mouth 4 (four) times daily as needed for flatulence.  . sucralfate (CARAFATE) 1 g tablet TAKE 1 TABLET BY MOUTH FOUR TIMES DAILY WITH MEALS AND AT BEDTIME  . tiZANidine (ZANAFLEX) 4 MG tablet Take 4 mg by mouth as needed.  . Vitamin E 134 MG (200 UNIT) TABS Take 1 tablet by  mouth daily.  . Zinc 50 MG CAPS Take 1 capsule by mouth daily.   No facility-administered encounter medications on file as of 08/08/2020.      Review of Systems:  Gen:  Denies  fever, sweats, chills weight loss  HEENT: Denies blurred vision, double vision, ear pain, eye pain, hearing loss, nose bleeds, sore throat Cardiac:  No dizziness, chest pain or heaviness, chest tightness,edema, No JVD Resp:   + cough, +sputum production, +shortness of breath,+wheezing, -hemoptysis,  Gi: Denies swallowing difficulty, stomach pain, nausea or vomiting, diarrhea, constipation, bowel incontinence Gu:  Denies bladder incontinence, burning urine Ext:   Denies Joint pain, stiffness or swelling Skin: Denies  skin rash, easy bruising or bleeding or hives Endoc:  Denies polyuria, polydipsia , polyphagia or weight change Psych:   Denies depression, insomnia or hallucinations  Other:  All other systems negative  BP (!) 144/72 (BP Location: Left Arm, Cuff Size: Normal)   Pulse (!) 45   Temp (!) 97.3 F (36.3 C) (Temporal)   Ht 5\' 5"  (1.651 m)   Wt 212 lb (96.2 kg)   SpO2 97%   BMI 35.28 kg/m    Physical Examination:   General Appearance: No distress  Neuro:without focal findings,  speech normal,  HEENT: PERRLA, EOM intact.   Pulmonary: normal breath sounds, No wheezing.  CardiovascularNormal S1,S2.  No m/r/g.   Abdomen: Benign, Soft, non-tender. Renal:  No costovertebral tenderness  GU:  Not performed at this time. Endoc: No evident thyromegaly Skin:   warm, no rashes, no ecchymosis  Extremities: normal, no cyanosis, clubbing. PSYCHIATRIC: Mood, affect within normal limits.   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 based on CT scan findings with ongoing intermittent reactive airways disease with COPD underlying chronic bronchitis  Sleep apnea Severe sleep apnea Patient  uses and benefits from CPAP therapy  AHI has been reduced to 1.9  Patient with excellent compliance since he has been using his new CPAP machine  Abnormal findings on diagnostic imaging of lung  Patient with bilateral groundglass opacifications Most likely related to COVID-19 infection with inflammation Recommend repeat CT scan in 6 months  Chronic bronchitis with COPD symptoms Continue Symbicort as prescribed Albuterol as needed  Former smoker PFTs ordered    Healthcare maintenance Recommend Pneumovax 23, patient declined today will consider in the future Recommend COVID-19 vaccination, patient declined will consider in the future  Obesity -recommend significant weight loss -recommend changing diet  Deconditioned state -Recommend increased daily activity and exercise    MEDICATION ADJUSTMENTS/LABS AND TESTS ORDERED: Continue inhalers as prescribed Continue albuterol as needed   follow-up CT chest in 6 months    CURRENT MEDICATIONS REVIEWED AT LENGTH WITH PATIENT TODAY   Patient satisfied with Plan of action and management. All questions answered  Follow up 6 months  TOTAL TIME SPENT 38 mins   Corrin Parker, M.D.  [facility]  Medical Director [facility] Cardio-Pulmonary Department

## 2020-08-08 NOTE — Patient Instructions (Signed)
Continue Inhalers as prescribed Follow up CT chest in 6 months  Continue CPAP as prescribed-Great Job A+!!!

## 2020-08-08 NOTE — Telephone Encounter (Signed)
Patient was seen in the office today by Dr. Mortimer Fries and wants the patient to have CT in 6 months. Hopefully we can get the CT approved at that time

## 2020-08-16 ENCOUNTER — Encounter: Payer: Self-pay | Admitting: Cardiology

## 2020-08-16 ENCOUNTER — Other Ambulatory Visit: Payer: Self-pay

## 2020-08-16 ENCOUNTER — Ambulatory Visit (INDEPENDENT_AMBULATORY_CARE_PROVIDER_SITE_OTHER): Payer: BC Managed Care – PPO | Admitting: Cardiology

## 2020-08-16 VITALS — BP 125/70 | HR 42 | Ht 69.0 in | Wt 208.0 lb

## 2020-08-16 DIAGNOSIS — R072 Precordial pain: Secondary | ICD-10-CM

## 2020-08-16 DIAGNOSIS — I471 Supraventricular tachycardia: Secondary | ICD-10-CM

## 2020-08-16 DIAGNOSIS — R001 Bradycardia, unspecified: Secondary | ICD-10-CM

## 2020-08-16 NOTE — Patient Instructions (Addendum)
Medication Instructions:  Your physician recommends that you continue on your current medications as directed. Please refer to the Current Medication list given to you today. *If you need a refill on your cardiac medications before your next appointment, please call your pharmacy*  Lab Work: None ordered. If you have labs (blood work) drawn today and your tests are completely normal, you will receive your results only by: Marland Kitchen MyChart Message (if you have MyChart) OR . A paper copy in the mail If you have any lab test that is abnormal or we need to change your treatment, we will call you to review the results.  Testing/Procedures: You will be scheduled for a nuclear stress test  Follow-Up: At [facility], you and your health needs are our priority.  As part of our continuing mission to provide you with exceptional heart care, we have created designated Provider Care Teams.  These Care Teams include your primary Cardiologist (physician) and Advanced Practice Providers (APPs -  Physician Assistants and Nurse Practitioners) who all work together to provide you with the care you need, when you need it.  Your next appointment:   Your physician wants you to follow-up in: as needed with Dr. Quentin Ore.    [place]  Your caregiver has ordered a Stress Test with nuclear imaging. The purpose of this test is to evaluate the blood supply to your heart muscle. This procedure is referred to as a "Non-Invasive Stress Test." This is because other than having an IV started in your vein, nothing is inserted or "invades" your body. Cardiac stress tests are done to find areas of poor blood flow to the heart by determining the extent of coronary artery disease (CAD). Some patients exercise on a treadmill, which naturally increases the blood flow to your heart, while others who are  unable to walk on a treadmill due to physical limitations have a pharmacologic/chemical stress agent called Lexiscan . This medicine  will mimic walking on a treadmill by temporarily increasing your coronary blood flow.   Please note: these test may take anywhere between 2-4 hours to complete  PLEASE REPORT TO [facility] AT THE FIRST DESK WILL DIRECT YOU WHERE TO GO  Date of Procedure:_____________________________________  Arrival Time for Procedure:______________________________  Instructions regarding medication:   You may take your normal morning medications on the morning of your test except do NOT take hydrochlorothiazide.  PLEASE NOTIFY THE OFFICE AT LEAST 2 HOURS IN ADVANCE IF YOU ARE UNABLE TO KEEP YOUR APPOINTMENT.  [phone] AND  PLEASE NOTIFY NUCLEAR MEDICINE AT [facility] AT LEAST 24 HOURS IN ADVANCE IF YOU ARE UNABLE TO KEEP YOUR APPOINTMENT. [phone]  How to prepare for your Myoview test:  1. Do not eat or drink after midnight 2. No caffeine for 24 hours prior to test 3. No smoking 24 hours prior to test. 4. Your medication may be taken with water.  If your doctor stopped a medication because of this test, do not take that medication. 5. Ladies, please do not wear dresses.  Skirts or pants are appropriate. Please wear a short sleeve shirt. 6. No perfume, cologne or lotion. 7. Wear comfortable walking shoes. No heels!

## 2020-08-16 NOTE — Progress Notes (Signed)
Electrophysiology Office Note:    Date:  08/16/2020   ID:  Daniel Cook, DOB Dec 09, 1955, MRN [id number]  PCP:  Valerie Roys, DO  [facility] Cardiologist:  Ida Rogue, MD  [facility] Electrophysiologist:  Vickie Epley, MD   Referring MD: Arvil Chaco, PA*   Chief Complaint: Bradycardia and SVT  History of Present Illness:    Daniel Cook is a 65 y.o. male who presents for an evaluation of bradycardia and SVT at the request of Lorenso Quarry, PA-C. Their medical history includes COVID-19, hypertension, sleep apnea.  The patient last saw Lorenso Quarry on July 26, 2020.  The patient has a history of dizziness while taking higher dose amlodipine.  There is concern that his dizziness could have been related to bradycardia given his chronic resting bradycardia.  After his amlodipine was decreased to 2.5 mg by mouth once daily, his dizziness completely resolved.  He tells me he is doing well without presyncope or syncope.  He is active.  He has been working on losing weight and is down over 10 pounds.  He had COVID in February of this year.  His symptoms were very severe and he is still trying to fully recover.  He also reports an intermittent "nagging" in the left side of his chest.  There are no reliable triggers for this pain.  It last seconds to minutes.  He is concerned that this could represent coronary artery disease.  He has had a coronary artery calcium score in the past which did put him in the 73rd percentile for CAC.  Past Medical History:  Diagnosis Date  . COVID-19 12/2019  . Gout   . High cholesterol   . Hypertension   . Obesity   . Sleep apnea   . Vertigo     Past Surgical History:  Procedure Laterality Date  . knee surgeries      Current Medications: Current Meds  Medication Sig  . albuterol (VENTOLIN HFA) 108 (90 Base) MCG/ACT inhaler INHALE 1 TO 2 PUFFS INTO THE LUNGS EVERY 6 HOURS AS NEEDED FOR WHEEZING OR SHORTNESS  OF BREATH  . allopurinol (ZYLOPRIM) 300 MG tablet Take 1 tablet (300 mg total) by mouth daily.  Marland Kitchen amLODipine (NORVASC) 2.5 MG tablet Take 1 tablet (2.5 mg total) by mouth daily.  . Ascorbic Acid (VITAMIN C) 1000 MG tablet Take 1,000 mg by mouth daily.  Marland Kitchen aspirin 325 MG tablet Take 325 mg by mouth daily.  Marland Kitchen atorvastatin (LIPITOR) 80 MG tablet Take 1 tablet (80 mg total) by mouth daily.  . budesonide-formoterol (SYMBICORT) 160-4.5 MCG/ACT inhaler Inhale 2 puffs into the lungs 2 (two) times daily.  . Cholecalciferol (VITAMIN D3) 25 MCG (1000 UT) CAPS Take 1 capsule by mouth daily.  . colchicine 0.6 MG tablet Take 1 tablet (0.6 mg total) by mouth daily as needed.  . ezetimibe (ZETIA) 10 MG tablet Take 1 tablet (10 mg total) by mouth daily.  Marland Kitchen gabapentin (NEURONTIN) 300 MG capsule Take 2 capsules (600 mg total) by mouth 3 (three) times daily.  . Glucosamine-Chondroit-Biofl-Mn (GLUCOSAMINE CHONDROIT,BIOFLAV, PO) Take 1 capsule by mouth daily.  . hydrochlorothiazide (HYDRODIURIL) 12.5 MG tablet Take 1 tablet (12.5 mg total) by mouth daily.  Marland Kitchen HYDROcodone-acetaminophen (NORCO/VICODIN) 5-325 MG tablet 1 po tid prn  . icosapent Ethyl (VASCEPA) 1 g capsule Take 2 capsules (2 g total) by mouth 2 (two) times daily.  Marland Kitchen loratadine (CLARITIN) 10 MG tablet Take 1 tablet (10 mg total) by mouth  daily.  . losartan (COZAAR) 100 MG tablet Take 1 tablet (100 mg total) by mouth daily.  . magnesium gluconate (MAGONATE) 500 MG tablet Take 500 mg by mouth daily.  . naproxen (NAPROSYN) 500 MG tablet TAKE ONE TABLET TWICE DAILY WITH A MEAL  . Omega-3 Fatty Acids (FISH OIL) 1200 MG CAPS Take 1 capsule by mouth daily.  Marland Kitchen omeprazole (PRILOSEC) 20 MG capsule Take 1 capsule (20 mg total) by mouth daily.  . ondansetron (ZOFRAN) 4 MG tablet Take 1 tablet (4 mg total) by mouth every 8 (eight) hours as needed for nausea or vomiting.  . simethicone (MYLICON) 40 XV/4.0GQ drops Take 0.6 mLs (40 mg total) by mouth 4 (four) times daily  as needed for flatulence.  . sucralfate (CARAFATE) 1 g tablet TAKE 1 TABLET BY MOUTH FOUR TIMES DAILY WITH MEALS AND AT BEDTIME  . tiZANidine (ZANAFLEX) 4 MG tablet Take 4 mg by mouth as needed.  . Vitamin E 134 MG (200 UNIT) TABS Take 1 tablet by mouth daily.  . Zinc 50 MG CAPS Take 1 capsule by mouth daily.     Allergies:   Patient has no known allergies.   Social History   Socioeconomic History  . Marital status: Divorced    Spouse name: Not on file  . Number of children: Not on file  . Years of education: Not on file  . Highest education level: Not on file  Occupational History  . Not on file  Tobacco Use  . Smoking status: Former Smoker    Packs/day: 2.00    Years: 20.00    Pack years: 40.00    Types: Cigarettes    Quit date: 10/04/1998    Years since quitting: 21.8  . Smokeless tobacco: Current User    Types: Chew  . Tobacco comment: dip occassionally  Vaping Use  . Vaping Use: Never used  Substance and Sexual Activity  . Alcohol use: Yes    Comment: occ  . Drug use: No    Types: Marijuana  . Sexual activity: Not on file  Other Topics Concern  . Not on file  Social History Narrative  . Not on file   Social Determinants of Health   Financial Resource Strain: Not on file  Food Insecurity: Not on file  Transportation Needs: Not on file  Physical Activity: Not on file  Stress: Not on file  Social Connections: Not on file     Family History: The patient's family history includes Heart attack (age of onset: 17) in his father; Heart disease in his father; Hyperlipidemia in his father; Hypertension in his father.  ROS:   Please see the history of present illness.    All other systems reviewed and are negative.  EKGs/Labs/Other Studies Reviewed:    The following studies were reviewed today:  07/14/2020 [facility] personally reviewed HR 37 - 182, average 54 bpm 12 SVT, longest lasting 14 seconds at a rate of 114 bpm Rare SVE and VE    EKG:  The ekg ordered  today demonstrates sinus bradycardia with a ventricular rate of 42 bpm  Recent Labs: 05/02/2020: ALT 18 06/02/2020: Magnesium 2.0; TSH 1.560 06/09/2020: BUN 20; Creatinine, Ser 1.10; Potassium 4.1; Sodium 135 06/27/2020: Hemoglobin 11.2; Platelets 243  Recent Lipid Panel    Component Value Date/Time   CHOL 216 (H) 05/02/2020 1619   CHOL 212 (H) 09/22/2018 1339   TRIG 301 (H) 05/02/2020 1619   TRIG 192 (H) 09/22/2018 1339   HDL 55 05/02/2020 1619  CHOLHDL 3.3 03/03/2018 1153   VLDL 38 (H) 09/22/2018 1339   LDLCALC 110 (H) 05/02/2020 1619    Physical Exam:    VS:  BP 125/70   Pulse (!) 42   Ht 5\' 9"  (1.753 m)   Wt 208 lb (94.3 kg)   BMI 30.72 kg/m     Wt Readings from Last 3 Encounters:  08/16/20 208 lb (94.3 kg)  08/08/20 212 lb (96.2 kg)  07/26/20 210 lb (95.3 kg)     GEN:  Well nourished, well developed in no acute distress HEENT: Normal NECK: No JVD; No carotid bruits LYMPHATICS: No lymphadenopathy CARDIAC: Regular rhythm.  Bradycardic.  no murmurs, rubs, gallops RESPIRATORY:  Clear to auscultation without rales, wheezing or rhonchi  ABDOMEN: Soft, non-tender, non-distended MUSCULOSKELETAL:  No edema; No deformity  SKIN: Warm and dry NEUROLOGIC:  Alert and oriented x 3 PSYCHIATRIC:  Normal affect   ASSESSMENT:    1. Precordial pain   2. Bradycardia   3. Atrial tachycardia ([place])    PLAN:    In order of problems listed above:  1. Precordial pain Patient with evidence of coronary artery calcium on recent CT scan and a "nagging" chest discomfort.  I think it would be best to assess for any evidence of ischemic coronary disease with a nuclear stress.  I discussed the procedure in detail with the patient and he wants to proceed.  We will get this ordered for him.  2.  Sinus bradycardia Asymptomatic.  For now, recommend continued conservative management with his heart rates.  If he develops exercise intolerance, presyncope or syncope that can be tied to his resting  bradycardia, would pursue permanent pacemaker.  I did discuss the possibility of needing a pacemaker in the future during today's visit.  3.  Atrial tachycardia Recent ZIO monitor did show brief episodes of NSVT that appears to be atrial tachycardia.  Given the asymptomatic nature of these episodes and their brevity, I do not think specific medical therapy is warranted.  This is especially the case in the setting of his resting bradycardia.  Follow-up based on above results  Total time spent with patient today 47 minutes. This includes reviewing records, evaluating the patient and coordinating care.  Medication Adjustments/Labs and Tests Ordered: Current medicines are reviewed at length with the patient today.  Concerns regarding medicines are outlined above.  Orders Placed This Encounter  Procedures  . NM Myocar Single W/Spect W/Wall Motion And EF  . EKG 12-Lead   No orders of the defined types were placed in this encounter.    Signed, Hilton Cork. Quentin Ore, MD, [facility], [facility] 08/16/2020 9:49 PM    Electrophysiology [facility]

## 2020-08-18 DIAGNOSIS — G4733 Obstructive sleep apnea (adult) (pediatric): Secondary | ICD-10-CM | POA: Diagnosis not present

## 2020-08-21 NOTE — Progress Notes (Signed)
Cardiology Office Note  Date:  08/22/2020   ID:  Daniel Cook [place], Daniel Cook 26-Dec-1955, MRN [id number]  PCP:  Daniel Roys, DO   Chief Complaint  Patient presents with  . Follow-up    Patient c/o fatigue, rapid heart beats at times and twinges in chest on occasions. Medications reviewed by the patient verbally.     HPI:  Daniel Cook is a 65 year old gentleman with past medical history of Hypertension Sleep apnea on CPAP Hyperlipidemia Former smoker Who presents for routine follow-up of his coronary calcification, bradycardia  Seen by one of our providers July 26, 2020 Heart rate of 40 Heart rate with pulmonary Aug 08, 2020 was 45 Recently seen by Dr. Quentin Cook Aug 16, 2020 Heart rate on that visit 42 Stress test was ordered for atypical chest pain Concern for bradycardia and dizziness Amlodipine dose was decreased down to 2.5 and symptoms improved  Had COVID February 2022, severe symptoms, was still recovering Reported intermittent nagging in the left chest  ZIO previously performed showing brief episodes of nonsustained atrial tachycardia  Could not afford cardiac CTA Stress test: too expensive  For unclear reasons was still on amlodipine 10 mg daily Will change to 2.5 mg he reports  Works at [employer] to work, no chest pain when  working No regular exercise program   Lab work reviewed with him Total chol 178, LDL 95 A1C 6.9   EKG personally reviewed by myself on todays visit Sinus bradycardia rate 38 bpm  Other past medical history reviewed Echo March 2022 Normal LV function, normal RV function, no significant valve disease   PMH:   has a past medical history of COVID-19 (12/2019), Gout, High cholesterol, Hypertension, Obesity, Sleep apnea, and Vertigo.  PSH:    Past Surgical History:  Procedure Laterality Date  . knee surgeries      Current Outpatient Medications  Medication Sig Dispense Refill  . albuterol (VENTOLIN HFA) 108 (90  Base) MCG/ACT inhaler INHALE 1 TO 2 PUFFS INTO THE LUNGS EVERY 6 HOURS AS NEEDED FOR WHEEZING OR SHORTNESS OF BREATH 18 g 1  . allopurinol (ZYLOPRIM) 300 MG tablet Take 1 tablet (300 mg total) by mouth daily. 90 tablet 1  . amLODipine (NORVASC) 2.5 MG tablet Take 1 tablet (2.5 mg total) by mouth daily. 30 tablet 5  . Ascorbic Acid (VITAMIN C) 1000 MG tablet Take 1,000 mg by mouth daily.    . Aspirin 81 MG CAPS Take 81 mg by mouth daily.    Marland Kitchen atorvastatin (LIPITOR) 80 MG tablet Take 1 tablet (80 mg total) by mouth daily. 30 tablet 5  . budesonide-formoterol (SYMBICORT) 160-4.5 MCG/ACT inhaler Inhale 2 puffs into the lungs 2 (two) times daily. 3 each 3  . Cholecalciferol (VITAMIN D3) 25 MCG (1000 UT) CAPS Take 1 capsule by mouth daily.    . colchicine 0.6 MG tablet Take 1 tablet (0.6 mg total) by mouth daily as needed. 90 tablet 1  . ezetimibe (ZETIA) 10 MG tablet Take 1 tablet (10 mg total) by mouth daily. 90 tablet 1  . gabapentin (NEURONTIN) 300 MG capsule Take 2 capsules (600 mg total) by mouth 3 (three) times daily. 540 capsule 1  . Glucosamine-Chondroit-Biofl-Mn (GLUCOSAMINE CHONDROIT,BIOFLAV, PO) Take 1 capsule by mouth daily.    . hydrochlorothiazide (HYDRODIURIL) 12.5 MG tablet Take 1 tablet (12.5 mg total) by mouth daily. 90 tablet 1  . HYDROcodone-acetaminophen (NORCO/VICODIN) 5-325 MG tablet 1 po tid prn 15 tablet 0  . icosapent Ethyl (VASCEPA)  1 g capsule Take 2 capsules (2 g total) by mouth 2 (two) times daily. 360 capsule 3  . loratadine (CLARITIN) 10 MG tablet Take 1 tablet (10 mg total) by mouth daily. 90 tablet 0  . losartan (COZAAR) 100 MG tablet Take 1 tablet (100 mg total) by mouth daily. 90 tablet 1  . magnesium gluconate (MAGONATE) 500 MG tablet Take 500 mg by mouth daily.    . naproxen (NAPROSYN) 500 MG tablet TAKE ONE TABLET TWICE DAILY WITH A MEAL 180 tablet 1  . Omega-3 Fatty Acids (FISH OIL) 1200 MG CAPS Take 1 capsule by mouth daily.    Marland Kitchen omeprazole (PRILOSEC) 20 MG  capsule Take 1 capsule (20 mg total) by mouth daily. 90 capsule 1  . ondansetron (ZOFRAN) 4 MG tablet Take 1 tablet (4 mg total) by mouth every 8 (eight) hours as needed for nausea or vomiting. 30 tablet 0  . simethicone (MYLICON) 40 FB/5.1WC drops Take 0.6 mLs (40 mg total) by mouth 4 (four) times daily as needed for flatulence. 120 mL 1  . sucralfate (CARAFATE) 1 g tablet TAKE 1 TABLET BY MOUTH FOUR TIMES DAILY WITH MEALS AND AT BEDTIME 120 tablet 1  . tiZANidine (ZANAFLEX) 4 MG tablet Take 4 mg by mouth as needed.    . Vitamin E 134 MG (200 UNIT) TABS Take 1 tablet by mouth daily.    . Zinc 50 MG CAPS Take 1 capsule by mouth daily.     No current facility-administered medications for this visit.     Allergies:   Patient has no known allergies.   Social History:  The patient  reports that he quit smoking about 21 years ago. His smoking use included cigarettes. He has a 40.00 pack-year smoking history. His smokeless tobacco use includes chew. He reports current alcohol use. He reports that he does not use drugs.   Family History:   family history includes Heart attack (age of onset: 36) in his father; Heart disease in his father; Hyperlipidemia in his father; Hypertension in his father.    Review of Systems: Review of Systems  Constitutional: Negative.   HENT: Negative.   Respiratory: Negative.   Cardiovascular: Negative.   Gastrointestinal: Negative.   Musculoskeletal: Negative.   Neurological: Negative.   Psychiatric/Behavioral: Negative.   All other systems reviewed and are negative.   PHYSICAL EXAM: VS:  BP 128/60 (BP Location: Left Arm, Patient Position: Sitting, Cuff Size: Normal)   Pulse (!) 38   Ht 5\' 9"  (1.753 m)   Wt 211 lb 6 oz (95.9 kg)   SpO2 97%   BMI 31.21 kg/m  , BMI Body mass index is 31.21 kg/m. Constitutional:  oriented to person, place, and time. No distress.  HENT:  Head: Grossly normal Eyes:  no discharge. No scleral icterus.  Neck: No JVD, no  carotid bruits  Cardiovascular: Regular, bradycardic,, no murmurs appreciated Pulmonary/Chest: Clear to auscultation bilaterally, no wheezes or rails Abdominal: Soft.  no distension.  no tenderness.  Musculoskeletal: Normal range of motion Neurological:  normal muscle tone. Coordination normal. No atrophy Skin: Skin warm and dry Psychiatric: normal affect, pleasant   Recent Labs: 05/02/2020: ALT 18 06/02/2020: Magnesium 2.0; TSH 1.560 06/09/2020: BUN 20; Creatinine, Ser 1.10; Potassium 4.1; Sodium 135 06/27/2020: Hemoglobin 11.2; Platelets 243    Lipid Panel Lab Results  Component Value Date   CHOL 216 (H) 05/02/2020   HDL 55 05/02/2020   LDLCALC 110 (H) 05/02/2020   TRIG 301 (H) 05/02/2020  Wt Readings from Last 3 Encounters:  08/22/20 211 lb 6 oz (95.9 kg)  08/16/20 208 lb (94.3 kg)  08/08/20 212 lb (96.2 kg)       ASSESSMENT AND PLAN:  Problem List Items Addressed This Visit    Bradycardia   Relevant Orders   EKG 12-Lead    Other Visit Diagnoses    Atrial tachycardia ([place])    -  Primary   Relevant Medications   Aspirin 81 MG CAPS   OSA on CPAP       Hyperlipidemia LDL goal <70       Relevant Medications   Aspirin 81 MG CAPS   Other Relevant Orders   Lipid panel   Precordial pain       Relevant Orders   EKG 12-Lead   Essential hypertension       Relevant Medications   Aspirin 81 MG CAPS   [place] coronary artery calcium score between 100 and 400         Coronary calcification Denies anginal symptoms, active, 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 Unable to afford cardiac CTA or Myoview Low calcium score 185 Medical management at this time, we have ordered repeat lipid panel today He will buy Fitbit monitor heartbeat at home look for an appropriate chronotropic response Recommended he stay hydrated  Bradycardia Relatively asymptomatic, seen by EP, will update them on heart rate Was also still on amlodipine 10, this was cut back to 2.5  Essential  hypertension Blood pressure well controlled, Amlodipine down to 2.5, consider holding HCTZ over the summer when he is sweating in the tire shop  Hyperlipidemia Repeat lipid panel today, goal LDL less than 70  Obstructive sleep apnea on CPAP Compliant with a CPAP   Total encounter time more than 25 minutes  Greater than 50% was spent in counseling and coordination of care with the patient  Signed, Esmond Plants, M.D., Ph.D. [place], [place]

## 2020-08-22 ENCOUNTER — Ambulatory Visit (INDEPENDENT_AMBULATORY_CARE_PROVIDER_SITE_OTHER): Payer: BC Managed Care – PPO | Admitting: Cardiovascular Disease

## 2020-08-22 ENCOUNTER — Encounter: Payer: Self-pay | Admitting: Cardiovascular Disease

## 2020-08-22 ENCOUNTER — Other Ambulatory Visit: Payer: Self-pay

## 2020-08-22 VITALS — BP 128/60 | HR 38 | Ht 69.0 in | Wt 211.4 lb

## 2020-08-22 DIAGNOSIS — I471 Supraventricular tachycardia: Secondary | ICD-10-CM

## 2020-08-22 DIAGNOSIS — R001 Bradycardia, unspecified: Secondary | ICD-10-CM | POA: Diagnosis not present

## 2020-08-22 DIAGNOSIS — R072 Precordial pain: Secondary | ICD-10-CM | POA: Diagnosis not present

## 2020-08-22 DIAGNOSIS — G4733 Obstructive sleep apnea (adult) (pediatric): Secondary | ICD-10-CM | POA: Diagnosis not present

## 2020-08-22 DIAGNOSIS — E785 Hyperlipidemia, unspecified: Secondary | ICD-10-CM | POA: Diagnosis not present

## 2020-08-22 DIAGNOSIS — R931 Abnormal findings on diagnostic imaging of heart and coronary circulation: Secondary | ICD-10-CM

## 2020-08-22 DIAGNOSIS — I10 Essential (primary) hypertension: Secondary | ICD-10-CM

## 2020-08-22 DIAGNOSIS — Z9989 Dependence on other enabling machines and devices: Secondary | ICD-10-CM

## 2020-08-22 NOTE — Patient Instructions (Addendum)
Medication Instructions:  No changes  If you need a refill on your cardiac medications before your next appointment, please call your pharmacy.    Lab work: Lipids    LABS WILL APPEAR ON MYCHART, ABNORMAL RESULTS WILL BE CALLED   Testing/Procedures: No new testing needed   Follow-Up:  . You will need a follow up appointment in 6 months  . Providers on your designated Care Team:   . Murray Hodgkins, NP . Christell Faith, PA-C . Marrianne Mood, PA-C   COVID-19 Vaccine Information can be found at: ShippingScam.co.uk For questions related to vaccine distribution or appointments, please email [email or web address] [email or web address] or call [phone].

## 2020-08-23 ENCOUNTER — Telehealth: Payer: Self-pay

## 2020-08-23 LAB — LIPID PANEL
Chol/HDL Ratio: 3.6 ratio (ref 0.0–5.0)
Cholesterol, Total: 153 mg/dL (ref 100–199)
HDL: 42 mg/dL (ref >39)
LDL Chol Calc (NIH): 92 mg/dL (ref 0–99)
Triglycerides: 103 mg/dL (ref 0–149)
VLDL Cholesterol Cal: 19 mg/dL (ref 5–40)

## 2020-08-23 MED ORDER — ROSUVASTATIN CALCIUM 5 MG PO TABS: 5.0000 mg | ORAL_TABLET | ORAL | 3 refills | Status: DC

## 2020-08-23 NOTE — Telephone Encounter (Signed)
Able to reach pt regarding his recent lab work, provider had a chance to review his results and advised   "Cholesterol improved on the Zetia from 216 down to 153  LDL still little bit elevated at 92 prefer 70 or less  Would he be willing to try a statin, did not see anything in the notes  If problems with statins before could try low-dose such as Crestor 5 mg 3 times a week"  Daniel Cook is very happy with the good report of much improved lipid panel. Will continue his diet and exercise and is agreeable with adding Crestor 5 mg 3 times a week along with Zetia 10 mg daily. Advised can repeat lipids at PCP or by Dr, Rockey Situ in 6 months to one year to recheck levels. Daniel Cook verbalized understanding. Otherwise, questions or concerns were address and no additional concerns at this time. Agreeable to plan, will call back for anything further.    [facility] Crestor 5 mg take 3 times a week.

## 2020-08-25 ENCOUNTER — Other Ambulatory Visit: Payer: Self-pay | Admitting: Family Medicine

## 2020-08-25 DIAGNOSIS — I10 Essential (primary) hypertension: Secondary | ICD-10-CM

## 2020-09-12 ENCOUNTER — Encounter: Payer: Self-pay | Admitting: Family Medicine

## 2020-09-12 ENCOUNTER — Ambulatory Visit: Payer: BC Managed Care – PPO | Admitting: Family Medicine

## 2020-09-12 ENCOUNTER — Other Ambulatory Visit: Payer: Self-pay

## 2020-09-12 VITALS — BP 134/62 | HR 46 | Temp 97.9°F | Ht 69.0 in | Wt 208.0 lb

## 2020-09-12 DIAGNOSIS — R5382 Chronic fatigue, unspecified: Secondary | ICD-10-CM

## 2020-09-12 DIAGNOSIS — E118 Type 2 diabetes mellitus with unspecified complications: Secondary | ICD-10-CM

## 2020-09-12 DIAGNOSIS — D649 Anemia, unspecified: Secondary | ICD-10-CM | POA: Diagnosis not present

## 2020-09-12 LAB — BAYER DCA HB A1C WAIVED: HB A1C (BAYER DCA - WAIVED): 5.7 % (ref <7.0)

## 2020-09-12 NOTE — Assessment & Plan Note (Signed)
Doing great with A1c of 5.7. Continue diet and exercise. Recheck 3 months.

## 2020-09-12 NOTE — Progress Notes (Signed)
BP 134/62   Pulse (!) 46   Temp 97.9 F (36.6 C)   Ht 5\' 9"  (1.753 m)   Wt 208 lb (94.3 kg)   SpO2 96%   BMI 30.72 kg/m    Subjective:    Patient ID: Daniel Cook, male    DOB: 04-05-1956, 65 y.o.   MRN: [id number]  HPI: Daniel Cook is a 65 y.o. male  Chief Complaint  Patient presents with  . Diabetes  . Fatigue    Patient states he is feeling fatigue and his eyes burn    DIABETES Hypoglycemic episodes:no Polydipsia/polyuria: no Visual disturbance: no Chest pain: no Paresthesias: no Glucose Monitoring: no  Accucheck frequency: Not Checking Taking Insulin?: no Blood Pressure Monitoring: not checking Retinal Examination: Not up to Date Foot Exam: Up to Date Diabetic Education: Not Completed Pneumovax: Not up to Date Influenza: Up to Date Aspirin: no   FATIGUE Duration:  months Severity: moderate  Onset: gradual Context when symptoms started:  unknown Symptoms improve with rest: no  Depressive symptoms: no Stress/anxiety: no Insomnia: no  Snoring: yes Observed apnea by bed partner: yes- but using his CPAP Daytime hypersomnolence:no Wakes feeling refreshed: no History of sleep study: yes Dysnea on exertion:  no Orthopnea/PND: no Chest pain: no Chronic cough: no Lower extremity edema: no Arthralgias:no Myalgias: no Weakness: no Rash: no  Relevant past medical, surgical, family and social history reviewed and updated as indicated. Interim medical history since our last visit reviewed. Allergies and medications reviewed and updated.  Review of Systems  Constitutional: Positive for fatigue. Negative for activity change, appetite change, chills, diaphoresis, fever and unexpected weight change.  Respiratory: Negative.   Gastrointestinal: Negative.   Psychiatric/Behavioral: Negative.     Per HPI unless specifically indicated above     Objective:    BP 134/62   Pulse (!) 46   Temp 97.9 F (36.6 C)   Ht 5\' 9"  (1.753 m)   Wt 208 lb  (94.3 kg)   SpO2 96%   BMI 30.72 kg/m   Wt Readings from Last 3 Encounters:  09/12/20 208 lb (94.3 kg)  08/22/20 211 lb 6 oz (95.9 kg)  08/16/20 208 lb (94.3 kg)    Physical Exam Vitals and nursing note reviewed.  Constitutional:      General: He is not in acute distress.    Appearance: Normal appearance. He is not ill-appearing, toxic-appearing or diaphoretic.  HENT:     Head: Normocephalic and atraumatic.     Right Ear: External ear normal.     Left Ear: External ear normal.     Nose: Nose normal.     Mouth/Throat:     Mouth: Mucous membranes are moist.     Pharynx: Oropharynx is clear.  Eyes:     General: No scleral icterus.       Right eye: No discharge.        Left eye: No discharge.     Extraocular Movements: Extraocular movements intact.     Conjunctiva/sclera: Conjunctivae normal.     Pupils: Pupils are equal, round, and reactive to light.  Cardiovascular:     Rate and Rhythm: Normal rate and regular rhythm.     Pulses: Normal pulses.     Heart sounds: Normal heart sounds. No murmur heard. No friction rub. No gallop.   Pulmonary:     Effort: Pulmonary effort is normal. No respiratory distress.     Breath sounds: Normal breath sounds. No stridor. No wheezing, rhonchi  or rales.  Chest:     Chest wall: No tenderness.  Musculoskeletal:        General: Normal range of motion.     Cervical back: Normal range of motion and neck supple.  Skin:    General: Skin is warm and dry.     Capillary Refill: Capillary refill takes less than 2 seconds.     Coloration: Skin is not jaundiced or pale.     Findings: No bruising, erythema, lesion or rash.  Neurological:     General: No focal deficit present.     Mental Status: He is alert and oriented to person, place, and time. Mental status is at baseline.  Psychiatric:        Mood and Affect: Mood normal.        Behavior: Behavior normal.        Thought Content: Thought content normal.        Judgment: Judgment normal.      Results for orders placed or performed in visit on 09/12/20  Bayer DCA Hb A1c Waived  Result Value Ref Range   HB A1C (BAYER DCA - WAIVED) 5.7 <7.0 %      Assessment & Plan:   Problem List Items Addressed This Visit      Endocrine   Controlled type 2 diabetes mellitus with complication, without long-term current use of insulin ([place]) - Primary    Doing great with A1c of 5.7. Continue diet and exercise. Recheck 3 months.       Relevant Orders   Bayer DCA Hb A1c Waived (Completed)    Other Visit Diagnoses    Chronic fatigue       Concern that this is due to anemia- will recheck labs today. To see GI next week.   Relevant Orders   CBC with Differential/Platelet   Anemia, unspecified type       To see GI next week. Will likely need colonoscopy. Rechecking labs today. Await results.    Relevant Orders   CBC with Differential/Platelet       Follow up plan: Return in about 3 months (around 12/13/2020) for physical.

## 2020-09-13 LAB — CBC WITH DIFFERENTIAL/PLATELET
Basophils Absolute: 0.1 10*3/uL (ref 0.0–0.2)
Basos: 1 %
EOS (ABSOLUTE): 0.2 10*3/uL (ref 0.0–0.4)
Eos: 3 %
Hematocrit: 35.5 % — ABNORMAL LOW (ref 37.5–51.0)
Hemoglobin: 12.1 g/dL — ABNORMAL LOW (ref 13.0–17.7)
Immature Grans (Abs): 0 10*3/uL (ref 0.0–0.1)
Immature Granulocytes: 0 %
Lymphocytes Absolute: 1.9 10*3/uL (ref 0.7–3.1)
Lymphs: 24 %
MCH: 31.7 pg (ref 26.6–33.0)
MCHC: 34.1 g/dL (ref 31.5–35.7)
MCV: 93 fL (ref 79–97)
Monocytes Absolute: 0.7 10*3/uL (ref 0.1–0.9)
Monocytes: 9 %
Neutrophils Absolute: 4.9 10*3/uL (ref 1.4–7.0)
Neutrophils: 63 %
Platelets: 203 10*3/uL (ref 150–450)
RBC: 3.82 x10E6/uL — ABNORMAL LOW (ref 4.14–5.80)
RDW: 13.3 % (ref 11.6–15.4)
WBC: 7.7 10*3/uL (ref 3.4–10.8)

## 2020-09-18 DIAGNOSIS — G4733 Obstructive sleep apnea (adult) (pediatric): Secondary | ICD-10-CM | POA: Diagnosis not present

## 2020-09-19 ENCOUNTER — Other Ambulatory Visit: Payer: Self-pay

## 2020-09-19 ENCOUNTER — Ambulatory Visit (INDEPENDENT_AMBULATORY_CARE_PROVIDER_SITE_OTHER): Payer: BC Managed Care – PPO | Admitting: Gastroenterology

## 2020-09-19 ENCOUNTER — Encounter: Payer: Self-pay | Admitting: Gastroenterology

## 2020-09-19 VITALS — BP 122/65 | HR 47 | Ht 69.0 in | Wt 210.4 lb

## 2020-09-19 DIAGNOSIS — R1319 Other dysphagia: Secondary | ICD-10-CM | POA: Diagnosis not present

## 2020-09-19 DIAGNOSIS — R195 Other fecal abnormalities: Secondary | ICD-10-CM

## 2020-09-19 MED ORDER — PEG 3350-KCL-NA BICARB-NACL 420 G PO SOLR: ORAL | 0 refills | Status: DC

## 2020-09-19 NOTE — Progress Notes (Signed)
Gastroenterology Consultation  Referring Provider:     Valerie Roys, DO Primary Care Physician:  Valerie Roys, DO Primary Gastroenterologist:  Dr. Allen Norris     Reason for Consultation:     Heme positive stools        HPI:   Daniel Cook is a 65 y.o. y/o male referred for consultation & management of Heme positive stools by Dr. Wynetta Emery, Megan P, DO.  This patient comes in today with a report of heme positive stools.  The patient also reports that he takes a bunch of pills in the morning and only recently has been having trouble swallowing pills.  He was also noted to have anemia and a workup was done showing him to have normal iron studies, normal MCV and normal ferritin.  The patient denies any unexplained weight loss fevers chills nausea vomiting black stools or bloody stools.  He reports that his last colonoscopy was proximal me 8 years ago and he was told at that time that he needed another colonoscopy in 10 years.  The patient states that he has been trying to lose weight and has been in his normal state of health.  There is no report of any black stools or bloody stools.  He also denies any family history of colon cancer colon polyps.  Past Medical History:  Diagnosis Date   COVID-19 12/2019   Gout    High cholesterol    Hypertension    Obesity    Sleep apnea    Vertigo     Past Surgical History:  Procedure Laterality Date   knee surgeries      Prior to Admission medications   Medication Sig Start Date End Date Taking? Authorizing Provider  albuterol (VENTOLIN HFA) 108 (90 Base) MCG/ACT inhaler INHALE 1 TO 2 PUFFS INTO THE LUNGS EVERY 6 HOURS AS NEEDED FOR WHEEZING OR SHORTNESS OF BREATH 05/02/20  Yes Johnson, Megan P, DO  allopurinol (ZYLOPRIM) 300 MG tablet Take 1 tablet (300 mg total) by mouth daily. 05/02/20  Yes Johnson, Megan P, DO  amLODipine (NORVASC) 2.5 MG tablet Take 1 tablet (2.5 mg total) by mouth daily. 06/02/20 11/29/20 Yes Visser, Jacquelyn D, PA-C   Ascorbic Acid (VITAMIN C) 1000 MG tablet Take 1,000 mg by mouth daily.   Yes [provider]  Aspirin 81 MG CAPS Take 81 mg by mouth daily.   Yes [provider]  atorvastatin (LIPITOR) 80 MG tablet Take 1 tablet (80 mg total) by mouth daily. 06/02/20 11/29/20 Yes Visser, Jacquelyn D, PA-C  budesonide-formoterol (SYMBICORT) 160-4.5 MCG/ACT inhaler Inhale 2 puffs into the lungs 2 (two) times daily. 05/02/20  Yes Johnson, Megan P, DO  Cholecalciferol (VITAMIN D3) 25 MCG (1000 UT) CAPS Take 1 capsule by mouth daily.   Yes [provider]  colchicine 0.6 MG tablet Take 1 tablet (0.6 mg total) by mouth daily as needed. 05/02/20  Yes Johnson, Megan P, DO  ezetimibe (ZETIA) 10 MG tablet Take 1 tablet (10 mg total) by mouth daily. 05/02/20  Yes Johnson, Megan P, DO  gabapentin (NEURONTIN) 300 MG capsule Take 2 capsules (600 mg total) by mouth 3 (three) times daily. 05/02/20  Yes Johnson, Megan P, DO  Glucosamine-Chondroit-Biofl-Mn (GLUCOSAMINE CHONDROIT,BIOFLAV, PO) Take 1 capsule by mouth daily.   Yes [provider]  hydrochlorothiazide (HYDRODIURIL) 12.5 MG tablet TAKE ONE TABLET (12.5 MG) BY MOUTH EVERYDAY 08/25/20  Yes Johnson, Megan P, DO  HYDROcodone-acetaminophen (NORCO/VICODIN) 5-325 MG tablet 1 po tid prn  03/29/19  Yes Johnson, Megan P, DO  icosapent Ethyl (VASCEPA) 1 g capsule Take 2 capsules (2 g total) by mouth 2 (two) times daily. 06/09/20 06/04/21 Yes Visser, Jacquelyn D, PA-C  loratadine (CLARITIN) 10 MG tablet Take 1 tablet (10 mg total) by mouth daily. 05/02/20  Yes Johnson, Megan P, DO  losartan (COZAAR) 100 MG tablet TAKE ONE TABLET (100 MG) BY MOUTH EVERY DAY 08/25/20  Yes Johnson, Megan P, DO  magnesium gluconate (MAGONATE) 500 MG tablet Take 500 mg by mouth daily.   Yes [provider]  naproxen (NAPROSYN) 500 MG tablet TAKE ONE TABLET TWICE DAILY WITH A MEAL 05/02/20  Yes Johnson, Megan P, DO  Omega-3 Fatty Acids (FISH OIL) 1200 MG CAPS Take 1 capsule  by mouth daily.   Yes [provider]  omeprazole (PRILOSEC) 20 MG capsule Take 1 capsule (20 mg total) by mouth daily. 05/02/20  Yes Johnson, Megan P, DO  ondansetron (ZOFRAN) 4 MG tablet Take 1 tablet (4 mg total) by mouth every 8 (eight) hours as needed for nausea or vomiting. 05/20/19  Yes Cannady, Jolene T, NP  rosuvastatin (CRESTOR) 5 MG tablet Take 1 tablet (5 mg total) by mouth 3 (three) times a week. 08/23/20 11/21/20 Yes Gollan, Kathlene November, MD  simethicone (MYLICON) 40 ID/7.8EU drops Take 0.6 mLs (40 mg total) by mouth 4 (four) times daily as needed for flatulence. 12/03/19  Yes Johnson, Megan P, DO  tiZANidine (ZANAFLEX) 4 MG tablet Take 4 mg by mouth as needed. 03/23/19  Yes [provider]  Vitamin E 134 MG (200 UNIT) TABS Take 1 tablet by mouth daily.   Yes [provider]  Zinc 50 MG CAPS Take 1 capsule by mouth daily.   Yes [provider]  polyethylene glycol-electrolytes (GAVILYTE-N WITH FLAVOR PACK) 420 g solution Drink one 8 oz glass every 20 mins until entire container is finished starting at 5:00pm on 10/05/20 09/19/20   Lucilla Lame, MD    Family History  Problem Relation Age of Onset   Hyperlipidemia Father    Hypertension Father    Heart disease Father    Heart attack Father 32     Social History   Tobacco Use   Smoking status: Former    Packs/day: 2.00    Years: 20.00    Pack years: 40.00    Types: Cigarettes    Quit date: 10/04/1998    Years since quitting: 21.9   Smokeless tobacco: Current    Types: Chew   Tobacco comments:    dip occassionally  Vaping Use   Vaping Use: Never used  Substance Use Topics   Alcohol use: Yes    Comment: occ   Drug use: No    Types: Marijuana    Allergies as of 09/19/2020   (No Known Allergies)    Review of Systems:    All systems reviewed and negative except where noted in HPI.   Physical Exam:  BP 122/65   Pulse (!) 47   Ht 5\' 9"  (1.753 m)   Wt 210 lb 6.4 oz (95.4 kg)   BMI  31.07 kg/m  No LMP for male patient. General:   Alert,  Well-developed, well-nourished, pleasant and cooperative in NAD Head:  Normocephalic and atraumatic. Eyes:  Sclera clear, no icterus.   Conjunctiva pink. Ears:  Normal auditory acuity. Neck:  Supple; no masses or thyromegaly. Lungs:  Respirations even and unlabored.  Clear throughout to auscultation.   No wheezes, crackles, or rhonchi. No  acute distress. Heart:  Regular rate and rhythm; no murmurs, clicks, rubs, or gallops. Abdomen:  Normal bowel sounds.  No bruits.  Soft, non-tender and non-distended without masses, hepatosplenomegaly or hernias noted.  No guarding or rebound tenderness.  Negative Carnett sign.   Rectal:  Deferred.  Pulses:  Normal pulses noted. Extremities:  No clubbing or edema.  No cyanosis. Neurologic:  Alert and oriented x3;  grossly normal neurologically. Skin:  Intact without significant lesions or rashes.  No jaundice. Lymph Nodes:  No significant cervical adenopathy. Psych:  Alert and cooperative. Normal mood and affect.  Imaging Studies: No results found.  Assessment and Plan:   Daniel Cook is a 65 y.o. y/o male who comes in today with a history of anemia that does not appear to be iron deficiency anemia but he does have dysphagia when he takes pills and has also been found to have heme positive stools. Due to the heme positive stools and dysphagia the patient will be set up for an EGD and colonoscopy.  Other sources of his anemia should be looked for since his iron studies and ferritin including his MCV have all been normal indicating this is unlikely an iron deficiency anemia.  The patient has been explained the plan and agrees with it.    Lucilla Lame, MD. Marval Regal    Note: This dictation was prepared with Dragon dictation along with smaller phrase technology. Any transcriptional errors that result from this process are unintentional.

## 2020-09-19 NOTE — H&P (View-Only) (Signed)
Gastroenterology Consultation  Referring Provider:     Valerie Roys, DO Primary Care Physician:  Valerie Roys, DO Primary Gastroenterologist:  Dr. Allen Norris     Reason for Consultation:     Heme positive stools        HPI:   Daniel Cook is a 65 y.o. y/o male referred for consultation & management of Heme positive stools by Dr. Wynetta Emery, Megan P, DO.  This patient comes in today with a report of heme positive stools.  The patient also reports that he takes a bunch of pills in the morning and only recently has been having trouble swallowing pills.  He was also noted to have anemia and a workup was done showing him to have normal iron studies, normal MCV and normal ferritin.  The patient denies any unexplained weight loss fevers chills nausea vomiting black stools or bloody stools.  He reports that his last colonoscopy was proximal me 8 years ago and he was told at that time that he needed another colonoscopy in 10 years.  The patient states that he has been trying to lose weight and has been in his normal state of health.  There is no report of any black stools or bloody stools.  He also denies any family history of colon cancer colon polyps.  Past Medical History:  Diagnosis Date   COVID-19 12/2019   Gout    High cholesterol    Hypertension    Obesity    Sleep apnea    Vertigo     Past Surgical History:  Procedure Laterality Date   knee surgeries      Prior to Admission medications   Medication Sig Start Date End Date Taking? Authorizing Provider  albuterol (VENTOLIN HFA) 108 (90 Base) MCG/ACT inhaler INHALE 1 TO 2 PUFFS INTO THE LUNGS EVERY 6 HOURS AS NEEDED FOR WHEEZING OR SHORTNESS OF BREATH 05/02/20  Yes Johnson, Megan P, DO  allopurinol (ZYLOPRIM) 300 MG tablet Take 1 tablet (300 mg total) by mouth daily. 05/02/20  Yes Johnson, Megan P, DO  amLODipine (NORVASC) 2.5 MG tablet Take 1 tablet (2.5 mg total) by mouth daily. 06/02/20 11/29/20 Yes Visser, Jacquelyn D, PA-C   Ascorbic Acid (VITAMIN C) 1000 MG tablet Take 1,000 mg by mouth daily.   Yes [provider]  Aspirin 81 MG CAPS Take 81 mg by mouth daily.   Yes [provider]  atorvastatin (LIPITOR) 80 MG tablet Take 1 tablet (80 mg total) by mouth daily. 06/02/20 11/29/20 Yes Visser, Jacquelyn D, PA-C  budesonide-formoterol (SYMBICORT) 160-4.5 MCG/ACT inhaler Inhale 2 puffs into the lungs 2 (two) times daily. 05/02/20  Yes Johnson, Megan P, DO  Cholecalciferol (VITAMIN D3) 25 MCG (1000 UT) CAPS Take 1 capsule by mouth daily.   Yes [provider]  colchicine 0.6 MG tablet Take 1 tablet (0.6 mg total) by mouth daily as needed. 05/02/20  Yes Johnson, Megan P, DO  ezetimibe (ZETIA) 10 MG tablet Take 1 tablet (10 mg total) by mouth daily. 05/02/20  Yes Johnson, Megan P, DO  gabapentin (NEURONTIN) 300 MG capsule Take 2 capsules (600 mg total) by mouth 3 (three) times daily. 05/02/20  Yes Johnson, Megan P, DO  Glucosamine-Chondroit-Biofl-Mn (GLUCOSAMINE CHONDROIT,BIOFLAV, PO) Take 1 capsule by mouth daily.   Yes [provider]  hydrochlorothiazide (HYDRODIURIL) 12.5 MG tablet TAKE ONE TABLET (12.5 MG) BY MOUTH EVERYDAY 08/25/20  Yes Johnson, Megan P, DO  HYDROcodone-acetaminophen (NORCO/VICODIN) 5-325 MG tablet 1 po tid prn  03/29/19  Yes Johnson, Megan P, DO  icosapent Ethyl (VASCEPA) 1 g capsule Take 2 capsules (2 g total) by mouth 2 (two) times daily. 06/09/20 06/04/21 Yes Visser, Jacquelyn D, PA-C  loratadine (CLARITIN) 10 MG tablet Take 1 tablet (10 mg total) by mouth daily. 05/02/20  Yes Johnson, Megan P, DO  losartan (COZAAR) 100 MG tablet TAKE ONE TABLET (100 MG) BY MOUTH EVERY DAY 08/25/20  Yes Johnson, Megan P, DO  magnesium gluconate (MAGONATE) 500 MG tablet Take 500 mg by mouth daily.   Yes [provider]  naproxen (NAPROSYN) 500 MG tablet TAKE ONE TABLET TWICE DAILY WITH A MEAL 05/02/20  Yes Johnson, Megan P, DO  Omega-3 Fatty Acids (FISH OIL) 1200 MG CAPS Take 1 capsule  by mouth daily.   Yes [provider]  omeprazole (PRILOSEC) 20 MG capsule Take 1 capsule (20 mg total) by mouth daily. 05/02/20  Yes Johnson, Megan P, DO  ondansetron (ZOFRAN) 4 MG tablet Take 1 tablet (4 mg total) by mouth every 8 (eight) hours as needed for nausea or vomiting. 05/20/19  Yes Cannady, Jolene T, NP  rosuvastatin (CRESTOR) 5 MG tablet Take 1 tablet (5 mg total) by mouth 3 (three) times a week. 08/23/20 11/21/20 Yes Gollan, Kathlene November, MD  simethicone (MYLICON) 40 OE/7.0JJ drops Take 0.6 mLs (40 mg total) by mouth 4 (four) times daily as needed for flatulence. 12/03/19  Yes Johnson, Megan P, DO  tiZANidine (ZANAFLEX) 4 MG tablet Take 4 mg by mouth as needed. 03/23/19  Yes [provider]  Vitamin E 134 MG (200 UNIT) TABS Take 1 tablet by mouth daily.   Yes [provider]  Zinc 50 MG CAPS Take 1 capsule by mouth daily.   Yes [provider]  polyethylene glycol-electrolytes (GAVILYTE-N WITH FLAVOR PACK) 420 g solution Drink one 8 oz glass every 20 mins until entire container is finished starting at 5:00pm on 10/05/20 09/19/20   Lucilla Lame, MD    Family History  Problem Relation Age of Onset   Hyperlipidemia Father    Hypertension Father    Heart disease Father    Heart attack Father 41     Social History   Tobacco Use   Smoking status: Former    Packs/day: 2.00    Years: 20.00    Pack years: 40.00    Types: Cigarettes    Quit date: 10/04/1998    Years since quitting: 21.9   Smokeless tobacco: Current    Types: Chew   Tobacco comments:    dip occassionally  Vaping Use   Vaping Use: Never used  Substance Use Topics   Alcohol use: Yes    Comment: occ   Drug use: No    Types: Marijuana    Allergies as of 09/19/2020   (No Known Allergies)    Review of Systems:    All systems reviewed and negative except where noted in HPI.   Physical Exam:  BP 122/65   Pulse (!) 47   Ht 5\' 9"  (1.753 m)   Wt 210 lb 6.4 oz (95.4 kg)   BMI  31.07 kg/m  No LMP for male patient. General:   Alert,  Well-developed, well-nourished, pleasant and cooperative in NAD Head:  Normocephalic and atraumatic. Eyes:  Sclera clear, no icterus.   Conjunctiva pink. Ears:  Normal auditory acuity. Neck:  Supple; no masses or thyromegaly. Lungs:  Respirations even and unlabored.  Clear throughout to auscultation.   No wheezes, crackles, or rhonchi. No  acute distress. Heart:  Regular rate and rhythm; no murmurs, clicks, rubs, or gallops. Abdomen:  Normal bowel sounds.  No bruits.  Soft, non-tender and non-distended without masses, hepatosplenomegaly or hernias noted.  No guarding or rebound tenderness.  Negative Carnett sign.   Rectal:  Deferred.  Pulses:  Normal pulses noted. Extremities:  No clubbing or edema.  No cyanosis. Neurologic:  Alert and oriented x3;  grossly normal neurologically. Skin:  Intact without significant lesions or rashes.  No jaundice. Lymph Nodes:  No significant cervical adenopathy. Psych:  Alert and cooperative. Normal mood and affect.  Imaging Studies: No results found.  Assessment and Plan:   Zykee Avakian is a 65 y.o. y/o male who comes in today with a history of anemia that does not appear to be iron deficiency anemia but he does have dysphagia when he takes pills and has also been found to have heme positive stools. Due to the heme positive stools and dysphagia the patient will be set up for an EGD and colonoscopy.  Other sources of his anemia should be looked for since his iron studies and ferritin including his MCV have all been normal indicating this is unlikely an iron deficiency anemia.  The patient has been explained the plan and agrees with it.    Lucilla Lame, MD. Marval Regal    Note: This dictation was prepared with Dragon dictation along with smaller phrase technology. Any transcriptional errors that result from this process are unintentional.

## 2020-09-20 DIAGNOSIS — M5126 Other intervertebral disc displacement, lumbar region: Secondary | ICD-10-CM | POA: Diagnosis not present

## 2020-09-20 DIAGNOSIS — M5416 Radiculopathy, lumbar region: Secondary | ICD-10-CM | POA: Diagnosis not present

## 2020-09-20 DIAGNOSIS — M5136 Other intervertebral disc degeneration, lumbar region: Secondary | ICD-10-CM | POA: Diagnosis not present

## 2020-09-26 ENCOUNTER — Encounter: Payer: Self-pay | Admitting: Gastroenterology

## 2020-10-06 ENCOUNTER — Encounter: Payer: Self-pay | Admitting: Gastroenterology

## 2020-10-06 ENCOUNTER — Ambulatory Visit: Payer: BC Managed Care – PPO | Admitting: Anesthesiology

## 2020-10-06 ENCOUNTER — Ambulatory Visit
Admission: RE | Admit: 2020-10-06 | Discharge: 2020-10-06 | Disposition: A | Discharge status: Home / Self Care | Payer: BC Managed Care – PPO | Attending: Gastroenterology | Admitting: Gastroenterology

## 2020-10-06 ENCOUNTER — Encounter
Admission: RE | Disposition: A | Discharge status: Home / Self Care | Payer: Self-pay | Source: Home / Self Care | Attending: Gastroenterology

## 2020-10-06 ENCOUNTER — Other Ambulatory Visit: Payer: Self-pay

## 2020-10-06 DIAGNOSIS — Z7951 Long term (current) use of inhaled steroids: Secondary | ICD-10-CM | POA: Diagnosis not present

## 2020-10-06 DIAGNOSIS — Z79899 Other long term (current) drug therapy: Secondary | ICD-10-CM | POA: Diagnosis not present

## 2020-10-06 DIAGNOSIS — R1319 Other dysphagia: Secondary | ICD-10-CM

## 2020-10-06 DIAGNOSIS — Z8616 Personal history of COVID-19: Secondary | ICD-10-CM | POA: Insufficient documentation

## 2020-10-06 DIAGNOSIS — Z7982 Long term (current) use of aspirin: Secondary | ICD-10-CM | POA: Insufficient documentation

## 2020-10-06 DIAGNOSIS — K5730 Diverticulosis of large intestine without perforation or abscess without bleeding: Secondary | ICD-10-CM | POA: Diagnosis not present

## 2020-10-06 DIAGNOSIS — K635 Polyp of colon: Secondary | ICD-10-CM | POA: Diagnosis not present

## 2020-10-06 DIAGNOSIS — R195 Other fecal abnormalities: Secondary | ICD-10-CM

## 2020-10-06 DIAGNOSIS — D122 Benign neoplasm of ascending colon: Secondary | ICD-10-CM | POA: Diagnosis not present

## 2020-10-06 DIAGNOSIS — D126 Benign neoplasm of colon, unspecified: Secondary | ICD-10-CM | POA: Diagnosis not present

## 2020-10-06 DIAGNOSIS — Z87891 Personal history of nicotine dependence: Secondary | ICD-10-CM | POA: Insufficient documentation

## 2020-10-06 DIAGNOSIS — K259 Gastric ulcer, unspecified as acute or chronic, without hemorrhage or perforation: Secondary | ICD-10-CM | POA: Diagnosis not present

## 2020-10-06 DIAGNOSIS — Z1211 Encounter for screening for malignant neoplasm of colon: Secondary | ICD-10-CM | POA: Diagnosis not present

## 2020-10-06 DIAGNOSIS — R1310 Dysphagia, unspecified: Secondary | ICD-10-CM | POA: Insufficient documentation

## 2020-10-06 DIAGNOSIS — K2970 Gastritis, unspecified, without bleeding: Secondary | ICD-10-CM

## 2020-10-06 DIAGNOSIS — K648 Other hemorrhoids: Secondary | ICD-10-CM | POA: Insufficient documentation

## 2020-10-06 HISTORY — PX: ESOPHAGOGASTRODUODENOSCOPY (EGD) WITH PROPOFOL: SHX5813

## 2020-10-06 HISTORY — PX: COLONOSCOPY WITH PROPOFOL: SHX5780

## 2020-10-06 HISTORY — DX: Chronic obstructive pulmonary disease, unspecified: J44.9

## 2020-10-06 SURGERY — COLONOSCOPY WITH PROPOFOL
Anesthesia: General | Site: Throat

## 2020-10-06 MED ORDER — SODIUM CHLORIDE 0.9 % IV SOLN
INTRAVENOUS | Status: DC
Start: 2020-10-06 — End: 2020-10-06

## 2020-10-06 MED ORDER — GLYCOPYRROLATE 0.2 MG/ML IJ SOLN
INTRAMUSCULAR | Status: DC | PRN
  Administered 2020-10-06 (×2): .1 mg via INTRAVENOUS

## 2020-10-06 MED ORDER — LIDOCAINE HCL (CARDIAC) PF 100 MG/5ML IV SOSY
PREFILLED_SYRINGE | INTRAVENOUS | Status: DC | PRN
  Administered 2020-10-06: 30 mg via INTRAVENOUS

## 2020-10-06 MED ORDER — LACTATED RINGERS IV SOLN: INTRAVENOUS | Status: DC

## 2020-10-06 MED ORDER — STERILE WATER FOR IRRIGATION IR SOLN
Status: DC | PRN
  Administered 2020-10-06: .05 mL

## 2020-10-06 MED ORDER — PROPOFOL 10 MG/ML IV BOLUS
INTRAVENOUS | Status: DC | PRN
  Administered 2020-10-06: 30 mg via INTRAVENOUS
  Administered 2020-10-06: 150 mg via INTRAVENOUS
  Administered 2020-10-06: 50 mg via INTRAVENOUS
  Administered 2020-10-06 (×2): 30 mg via INTRAVENOUS

## 2020-10-06 SURGICAL SUPPLY — 10 items
BLOCK BITE 60FR ADLT L/F GRN (MISCELLANEOUS) ×3 IMPLANT
FORCEPS BIOP RAD 4 LRG CAP 4 (CUTTING FORCEPS) ×3 IMPLANT
GOWN CVR UNV OPN BCK APRN NK (MISCELLANEOUS) ×8 IMPLANT
GOWN ISOL THUMB LOOP REG UNIV (MISCELLANEOUS) ×4
KIT PRC NS LF DISP ENDO (KITS) ×4 IMPLANT
KIT PROCEDURE OLYMPUS (KITS) ×2
MANIFOLD NEPTUNE II (INSTRUMENTS) ×3 IMPLANT
SNARE COLD EXACTO (MISCELLANEOUS) ×3 IMPLANT
TRAP ETRAP POLY (MISCELLANEOUS) ×3 IMPLANT
WATER STERILE IRR 250ML POUR (IV SOLUTION) ×6 IMPLANT

## 2020-10-06 NOTE — Transfer of Care (Signed)
Immediate Anesthesia Transfer of Care Note  Patient: Daniel Cook  Procedure(s) Performed: COLONOSCOPY WITH PROPOFOL (Rectum) ESOPHAGOGASTRODUODENOSCOPY (EGD) WITH PROPOFOL (Throat)  Patient Location: PACU  Anesthesia Type: General  Level of Consciousness: awake, alert  and patient cooperative  Airway and Oxygen Therapy: Patient Spontanous Breathing and Patient connected to supplemental oxygen  Post-op Assessment: Post-op Vital signs reviewed, Patient's Cardiovascular Status Stable, Respiratory Function Stable, Patent Airway and No signs of Nausea or vomiting  Post-op Vital Signs: Reviewed and stable  Complications: No notable events documented.

## 2020-10-06 NOTE — Anesthesia Procedure Notes (Signed)
Date/Time: 10/06/2020 11:29 AM Performed by: Cameron Ali, CRNA Pre-anesthesia Checklist: Patient identified, Emergency Drugs available, Suction available, Timeout performed and Patient being monitored Patient Re-evaluated:Patient Re-evaluated prior to induction Oxygen Delivery Method: Nasal cannula Placement Confirmation: positive ETCO2

## 2020-10-06 NOTE — Interval H&P Note (Signed)
Lucilla Lame, MD [address] [place], [place] [address] [phone] Fax : [phone]  Primary Care Physician:  Valerie Roys, DO Primary Gastroenterologist:  Dr. Allen Norris  Pre-Procedure History & Physical: HPI:  Daniel Cook is a 65 y.o. male is here for an endoscopy and colonoscopy.   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 Authorizing Provider  albuterol (VENTOLIN HFA) 108 (90 Base) MCG/ACT inhaler INHALE 1 TO 2 PUFFS INTO THE LUNGS EVERY 6 HOURS AS NEEDED FOR WHEEZING OR SHORTNESS OF BREATH 05/02/20  Yes Johnson, Megan P, DO  allopurinol (ZYLOPRIM) 300 MG tablet Take 1 tablet (300 mg total) by mouth daily. 05/02/20  Yes Johnson, Megan P, DO  amLODipine (NORVASC) 2.5 MG tablet Take 1 tablet (2.5 mg total) by mouth daily. 06/02/20 11/29/20 Yes Visser, Jacquelyn D, PA-C  Ascorbic Acid (VITAMIN C) 1000 MG tablet Take 1,000 mg by mouth daily.   Yes [provider]  Aspirin 81 MG CAPS Take 81 mg by mouth daily.   Yes [provider]  atorvastatin (LIPITOR) 80 MG tablet Take 1 tablet (80 mg total) by mouth daily. 06/02/20 11/29/20 Yes Visser, Jacquelyn D, PA-C  budesonide-formoterol (SYMBICORT) 160-4.5 MCG/ACT inhaler Inhale 2 puffs into the lungs 2 (two) times daily. 05/02/20  Yes Johnson, Megan P, DO  Cholecalciferol (VITAMIN D3) 25 MCG (1000 UT) CAPS Take 1 capsule by mouth daily.   Yes [provider]  colchicine 0.6 MG tablet Take 1 tablet (0.6 mg total) by mouth daily as needed. 05/02/20  Yes Johnson, Megan P, DO  ezetimibe (ZETIA) 10 MG tablet Take 1 tablet (10 mg  total) by mouth daily. 05/02/20  Yes Johnson, Megan P, DO  gabapentin (NEURONTIN) 300 MG capsule Take 2 capsules (600 mg total) by mouth 3 (three) times daily. 05/02/20  Yes Johnson, Megan P, DO  Glucosamine-Chondroit-Biofl-Mn (GLUCOSAMINE CHONDROIT,BIOFLAV, PO) Take 1 capsule by mouth daily.   Yes [provider]  hydrochlorothiazide (HYDRODIURIL) 12.5 MG tablet TAKE ONE TABLET (12.5 MG) BY MOUTH EVERYDAY 08/25/20  Yes Johnson, Megan P, DO  HYDROcodone-acetaminophen (NORCO/VICODIN) 5-325 MG tablet 1 po tid prn 03/29/19  Yes Johnson, Megan P, DO  icosapent Ethyl (VASCEPA) 1 g capsule Take 2 capsules (2 g total) by mouth 2 (two) times daily. 06/09/20 06/04/21 Yes Visser, Jacquelyn D, PA-C  loratadine (CLARITIN) 10 MG tablet Take 1 tablet (10 mg total) by mouth daily. 05/02/20  Yes Johnson, Megan P, DO  losartan (COZAAR) 100 MG tablet TAKE ONE TABLET (100 MG) BY MOUTH EVERY DAY 08/25/20  Yes Johnson, Megan P, DO  magnesium gluconate (MAGONATE) 500 MG tablet Take 500 mg by mouth daily.   Yes [provider]  naproxen (NAPROSYN) 500 MG tablet TAKE ONE TABLET TWICE DAILY WITH A MEAL 05/02/20  Yes Johnson, Megan P, DO  Omega-3 Fatty Acids (FISH OIL) 1200 MG CAPS Take 1 capsule by mouth daily.   Yes [provider]  omeprazole (PRILOSEC) 20 MG capsule Take 1 capsule (20 mg total) by mouth  daily. 05/02/20  Yes Johnson, Megan P, DO  rosuvastatin (CRESTOR) 5 MG tablet Take 1 tablet (5 mg total) by mouth 3 (three) times a week. 08/23/20 11/21/20 Yes Gollan, Kathlene November, MD  Vitamin E 134 MG (200 UNIT) TABS Take 1 tablet by mouth daily.   Yes [provider]  Zinc 50 MG CAPS Take 1 capsule by mouth daily.   Yes [provider]  ondansetron (ZOFRAN) 4 MG tablet Take 1 tablet (4 mg total) by mouth every 8 (eight) hours as needed for nausea or vomiting. Patient not taking: Reported on 09/26/2020 05/20/19   Marnee Guarneri T, NP  polyethylene glycol-electrolytes (GAVILYTE-N WITH FLAVOR  PACK) 420 g solution Drink one 8 oz glass every 20 mins until entire container is finished starting at 5:00pm on 10/05/20 09/19/20   Lucilla Lame, MD  simethicone (MYLICON) 40 PY/1.9JK drops Take 0.6 mLs (40 mg total) by mouth 4 (four) times daily as needed for flatulence. Patient not taking: Reported on 09/26/2020 12/03/19   Park Liter P, DO  tiZANidine (ZANAFLEX) 4 MG tablet Take 4 mg by mouth as needed. Patient not taking: Reported on 09/26/2020 03/23/19   [provider]    Allergies as of 09/19/2020   (No Known Allergies)    Family History  Problem Relation Age of Onset   Hyperlipidemia Father    Hypertension Father    Heart disease Father    Heart attack Father 74    Social History   Socioeconomic History   Marital status: Divorced    Spouse name: Not on file   Number of children: Not on file   Years of education: Not on file   Highest education level: Not on file  Occupational History   Not on file  Tobacco Use   Smoking status: Former    Packs/day: 2.00    Years: 20.00    Pack years: 40.00    Types: Cigarettes    Quit date: 10/04/1998    Years since quitting: 22.0   Smokeless tobacco: Current    Types: Snuff   Tobacco comments:    dip occassionally  Vaping Use   Vaping Use: Never used  Substance and Sexual Activity   Alcohol use: Yes    Comment: occ (weekends)   Drug use: No    Types: Marijuana   Sexual activity: Not on file  Other Topics Concern   Not on file  Social History Narrative   Not on file   Social Determinants of Health   Financial Resource Strain: Not on file  Food Insecurity: Not on file  Transportation Needs: Not on file  Physical Activity: Not on file  Stress: Not on file  Social Connections: Not on file  Intimate Partner Violence: Not on file    Review of Systems: See HPI, otherwise negative ROS  Physical Exam: BP (!) 150/67   Pulse (!) 43   Temp 98.1 F (36.7 C) (Temporal)   Resp 16   Ht 5\' 9"  (1.753 m)   Wt 93  kg   SpO2 96%   BMI 30.27 kg/m  General:   Alert,  pleasant and cooperative in NAD Head:  Normocephalic and atraumatic. Neck:  Supple; no masses or thyromegaly. Lungs:  Clear throughout to auscultation.    Heart:  Regular rate and rhythm. Abdomen:  Soft, nontender and nondistended. Normal bowel sounds, without guarding, and without rebound.   Neurologic:  Alert and  oriented x4;  grossly normal neurologically.  Impression/Plan: Daniel Cook is  here for an endoscopy and colonoscopy to be performed for dysphagia and heme positive stools  Risks, benefits, limitations, and alternatives regarding  endoscopy and colonoscopy have been reviewed with the patient.  Questions have been answered.  All parties agreeable.   Lucilla Lame, MD  10/06/2020, 11:20 AM

## 2020-10-06 NOTE — Anesthesia Postprocedure Evaluation (Signed)
Anesthesia Post Note  Patient: Daniel Cook  Procedure(s) Performed: COLONOSCOPY WITH PROPOFOL (Rectum) ESOPHAGOGASTRODUODENOSCOPY (EGD) WITH PROPOFOL (Throat)     Patient location during evaluation: PACU Anesthesia Type: General Level of consciousness: awake and alert Pain management: pain level controlled Vital Signs Assessment: post-procedure vital signs reviewed and stable Respiratory status: spontaneous breathing Cardiovascular status: stable Anesthetic complications: no   No notable events documented.  Gillian Scarce

## 2020-10-06 NOTE — Anesthesia Preprocedure Evaluation (Signed)
Anesthesia Evaluation  Patient identified by MRN, date of birth, ID band Patient awake    Reviewed: Allergy & Precautions, H&P , NPO status , Patient's Chart, lab work & pertinent test results  Airway Mallampati: II  TM Distance: >3 FB Neck ROM: full    Dental no notable dental hx.    Pulmonary sleep apnea , COPD, former smoker,    Pulmonary exam normal        Cardiovascular hypertension, On Medications Normal cardiovascular exam Rhythm:regular Rate:Normal     Neuro/Psych negative neurological ROS  negative psych ROS   GI/Hepatic negative GI ROS, Neg liver ROS,   Endo/Other  Well Controlled  Renal/GU   negative genitourinary   Musculoskeletal   Abdominal   Peds  Hematology negative hematology ROS (+)   Anesthesia Other Findings   Reproductive/Obstetrics                            Anesthesia Physical Anesthesia Plan  ASA: 2  Anesthesia Plan: General   Post-op Pain Management:    Induction:   PONV Risk Score and Plan: 2 and Propofol infusion  Airway Management Planned:   Additional Equipment:   Intra-op Plan:   Post-operative Plan:   Informed Consent: I have reviewed the patients History and Physical, chart, labs and discussed the procedure including the risks, benefits and alternatives for the proposed anesthesia with the patient or authorized representative who has indicated his/her understanding and acceptance.       Plan Discussed with:   Anesthesia Plan Comments:         Anesthesia Quick Evaluation

## 2020-10-06 NOTE — Op Note (Signed)
[facility]enter[facility] Patient Name: Daniel Cook Procedure Date: 10/06/2020 11:25 AM MRN: [id number] Account #: [id number] Date of Birth: November 03, 1955 Admit Type: Outpatient Age: 65 Room: [facility] OR ROOM 01 Gender: Male Note Status: Finalized Procedure:             Colonoscopy Indications:           Screening for colorectal malignant neoplasm Providers:             Lucilla Lame MD, MD Medicines:             Propofol per Anesthesia Complications:         No immediate complications. Procedure:             Pre-Anesthesia Assessment:                        - Prior to the procedure, a History and Physical was                         performed, and patient medications and allergies were                         reviewed. The patient's tolerance of previous                         anesthesia was also reviewed. The risks and benefits                         of the procedure and the sedation options and risks                         were discussed with the patient. All questions were                         answered, and informed consent was obtained. Prior                         Anticoagulants: The patient has taken no previous                         anticoagulant or antiplatelet agents. ASA Grade                         Assessment: II - A patient with mild systemic disease.                         After reviewing the risks and benefits, the patient                         was deemed in satisfactory condition to undergo the                         procedure.                        After obtaining informed consent, the colonoscope was                         passed under direct vision. Throughout the procedure,  the patient's blood pressure, pulse, and oxygen                         saturations were monitored continuously. The                         Colonoscope was introduced through the anus and                         advanced to the the cecum,  identified by appendiceal                         orifice and ileocecal valve. The colonoscopy was                         performed without difficulty. The patient tolerated                         the procedure well. The quality of the bowel                         preparation was excellent. Findings:      The perianal and digital rectal examinations were normal.      A 4 mm polyp was found in the sigmoid colon. The polyp was sessile. The       polyp was removed with a cold snare. Resection and retrieval were       complete.      A 4 mm polyp was found in the ascending colon. The polyp was sessile.       The polyp was removed with a cold snare. Resection and retrieval were       complete.      Multiple small-mouthed diverticula were found in the sigmoid colon.      Non-bleeding internal hemorrhoids were found during retroflexion. The       hemorrhoids were Grade I (internal hemorrhoids that do not prolapse). Impression:            - One 4 mm polyp in the sigmoid colon, removed with a                         cold snare. Resected and retrieved.                        - One 4 mm polyp in the ascending colon, removed with                         a cold snare. Resected and retrieved.                        - Diverticulosis in the sigmoid colon.                        - Non-bleeding internal hemorrhoids. Recommendation:        - Discharge patient to home.                        - Resume previous diet.                        -  Continue present medications.                        - Await pathology results.                        - Repeat colonoscopy in 7 years for surveillance. Procedure Code(s):     --- Professional ---                        [phone], Colonoscopy, flexible; with removal of                         tumor(s), polyp(s), or other lesion(s) by snare                         technique Diagnosis Code(s):     --- Professional ---                        Z12.11, Encounter for screening  for malignant neoplasm                         of colon                        K63.5, Polyp of colon CPT copyright 2019 [employer]. All rights reserved. The codes documented in this report are preliminary and upon coder review may  be revised to meet current compliance requirements. Lucilla Lame MD, MD 10/06/2020 11:55:53 AM This report has been signed electronically. Number of Addenda: 0 Note Initiated On: 10/06/2020 11:25 AM Scope Withdrawal Time: 0 hours 10 minutes 9 seconds  Total Procedure Duration: 0 hours 12 minutes 39 seconds  Estimated Blood Loss:  Estimated blood loss: none.      [facility]

## 2020-10-06 NOTE — Op Note (Signed)
[facility]astroenterology Patient Name: Daniel Cook Procedure Date: 10/06/2020 11:27 AM MRN: [id number] Account #: [id number] Date of Birth: 09/09/1955 Admit Type: Outpatient Age: 65 Room: [facility] OR ROOM 1 Gender: Male Note Status: Finalized Procedure:             Upper GI endoscopy Indications:           Dysphagia Providers:             Lucilla Lame MD, MD Referring MD:          Valerie Roys (Referring MD) Medicines:             Propofol per Anesthesia Complications:         No immediate complications. Procedure:             Pre-Anesthesia Assessment:                        - Prior to the procedure, a History and Physical was                         performed, and patient medications and allergies were                         reviewed. The patient's tolerance of previous                         anesthesia was also reviewed. The risks and benefits                         of the procedure and the sedation options and risks                         were discussed with the patient. All questions were                         answered, and informed consent was obtained. Prior                         Anticoagulants: The patient has taken no previous                         anticoagulant or antiplatelet agents. ASA Grade                         Assessment: II - A patient with mild systemic disease.                         After reviewing the risks and benefits, the patient                         was deemed in satisfactory condition to undergo the                         procedure.                        After obtaining informed consent, the endoscope was  passed under direct vision. Throughout the procedure,                         the patient's blood pressure, pulse, and oxygen                         saturations were monitored continuously. The was                         introduced through the mouth, and advanced to the                          second part of duodenum. The upper GI endoscopy was                         accomplished without difficulty. The patient tolerated                         the procedure well. Findings:      The examined esophagus was normal.      One non-bleeding cratered gastric ulcer with no stigmata of bleeding was       found at the pylorus.      One non-bleeding cratered gastric ulcer with no stigmata of bleeding was       found in the stomach.      Localized moderate inflammation characterized by erythema was found in       the gastric antrum. Biopsies were taken with a cold forceps for       histology.      The examined duodenum was normal. Impression:            - Normal esophagus.                        - Non-bleeding gastric ulcer with no stigmata of                         bleeding.                        - Non-bleeding gastric ulcer with no stigmata of                         bleeding.                        - Gastritis. Biopsied.                        - Normal examined duodenum. Recommendation:        - Discharge patient to home.                        - Resume previous diet.                        - Continue present medications.                        - Await pathology results.                        -  Perform a colonoscopy today. Procedure Code(s):     --- Professional ---                        [id number], Esophagogastroduodenoscopy, flexible,                         transoral; with biopsy, single or multiple Diagnosis Code(s):     --- Professional ---                        R13.10, Dysphagia, unspecified                        K29.70, Gastritis, unspecified, without bleeding                        K25.9, Gastric ulcer, unspecified as acute or chronic,                         without hemorrhage or perforation CPT copyright 2019 [employer]. All rights reserved. The codes documented in this report are preliminary and upon coder review may  be revised to meet current  compliance requirements. Lucilla Lame MD, MD 10/06/2020 11:39:29 AM This report has been signed electronically. Number of Addenda: 0 Note Initiated On: 10/06/2020 11:27 AM Total Procedure Duration: 0 hours 2 minutes 10 seconds  Estimated Blood Loss:  Estimated blood loss: none.      [facility]

## 2020-10-10 ENCOUNTER — Encounter: Payer: Self-pay | Admitting: Gastroenterology

## 2020-10-10 LAB — SURGICAL PATHOLOGY

## 2020-10-14 ENCOUNTER — Encounter: Payer: Self-pay | Admitting: Gastroenterology

## 2020-10-18 DIAGNOSIS — G4733 Obstructive sleep apnea (adult) (pediatric): Secondary | ICD-10-CM | POA: Diagnosis not present

## 2020-10-20 DIAGNOSIS — J069 Acute upper respiratory infection, unspecified: Secondary | ICD-10-CM | POA: Diagnosis not present

## 2020-10-20 DIAGNOSIS — Z20822 Contact with and (suspected) exposure to covid-19: Secondary | ICD-10-CM | POA: Diagnosis not present

## 2020-10-26 ENCOUNTER — Other Ambulatory Visit: Payer: Self-pay | Admitting: Family Medicine

## 2020-10-31 ENCOUNTER — Ambulatory Visit: Payer: BC Managed Care – PPO | Admitting: Family Medicine

## 2020-12-12 NOTE — Progress Notes (Signed)
BP 120/70   Pulse (!) 40   Temp 98.3 F (36.8 C) (Oral)   Ht 5' 9.02" (1.753 m)   Wt 208 lb (94.3 kg)   SpO2 96%   BMI 30.70 kg/m    Subjective:    Patient ID: Daniel Cook, male    DOB: 1955/07/06, 65 y.o.   MRN: [id number]  HPI: TRAPPER GOING is a 65 y.o. male presenting on 12/13/2020 for comprehensive medical examination. Current medical complaints include:  HYPERTENSION / HYPERLIPIDEMIA Satisfied with current treatment? yes Duration of hypertension: chronic BP monitoring frequency: a few times a week BP range: 0000000 systolic BP medication side effects: no Past BP meds: amlodipine, HCTZ Duration of hyperlipidemia: chronic Cholesterol medication side effects: no Cholesterol supplements: none Past cholesterol medications: atorvastatin, crestor, vesepa Medication compliance: excellent compliance Aspirin: yes Recent stressors: no Recurrent headaches: no Visual changes: no Palpitations: no Dyspnea: no Chest pain: no Lower extremity edema: no Dizzy/lightheaded: no  DIABETES Hypoglycemic episodes:no Polydipsia/polyuria: no Visual disturbance: no Chest pain: no Paresthesias: no Glucose Monitoring: no Taking Insulin?: no Blood Pressure Monitoring: not checking Retinal Examination: Not up to Date Foot Exam: Up to Date Diabetic Education: Completed Pneumovax: Not up to Date Influenza: Up to Date Aspirin: yes  No gout flares. Tolerating the allopurinol well.   Interim Problems from his last visit: no  Depression Screen done today and results listed below:  Depression screen [facility] 2/9 12/13/2020 06/09/2020 04/27/2019 03/29/2019 08/22/2017  Decreased Interest 0 0 0 0 0  Down, Depressed, Hopeless 0 0 0 0 0  PHQ - 2 Score 0 0 0 0 0  Altered sleeping - - 0 - -  Tired, decreased energy - - 0 - -  Change in appetite - - 0 - -  Feeling bad or failure about yourself  - - 0 - -  Trouble concentrating - - 0 - -  Moving slowly or fidgety/restless - - 0 - -  Suicidal  thoughts - - 0 - -  PHQ-9 Score - - 0 - -  Difficult doing work/chores - - Not difficult at all - -    Past Medical History:  Past Medical History:  Diagnosis Date   COPD (chronic obstructive pulmonary disease) ([place])    COVID-19 12/2019   Has seen pulmonology for "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   Gout    High cholesterol    Hypertension    Obesity    Sleep apnea    CPAP   Vertigo     Surgical History:  Past Surgical History:  Procedure Laterality Date   COLONOSCOPY WITH PROPOFOL N/A 10/06/2020   Procedure: COLONOSCOPY WITH PROPOFOL;  Surgeon: Lucilla Lame, MD;  Location: [place];  Service: Endoscopy;  Laterality: N/A;   ESOPHAGOGASTRODUODENOSCOPY (EGD) WITH PROPOFOL N/A 10/06/2020   Procedure: ESOPHAGOGASTRODUODENOSCOPY (EGD) WITH PROPOFOL;  Surgeon: Lucilla Lame, MD;  Location: [place];  Service: Endoscopy;  Laterality: N/A;   knee surgeries      Medications:  Current Outpatient Medications on File Prior to Visit  Medication Sig   Ascorbic Acid (VITAMIN C) 1000 MG tablet Take 1,000 mg by mouth daily.   Aspirin 81 MG CAPS Take 81 mg by mouth daily.   budesonide-formoterol (SYMBICORT) 160-4.5 MCG/ACT inhaler Inhale 2 puffs into the lungs 2 (two) times daily.   Cholecalciferol (VITAMIN D3) 25 MCG (1000 UT) CAPS Take 1 capsule by mouth daily.   Glucosamine-Chondroit-Biofl-Mn (GLUCOSAMINE CHONDROIT,BIOFLAV, PO) Take 1 capsule by mouth daily.   HYDROcodone-acetaminophen (NORCO/VICODIN)  5-325 MG tablet 1 po tid prn   magnesium gluconate (MAGONATE) 500 MG tablet Take 500 mg by mouth daily.   Omega-3 Fatty Acids (FISH OIL) 1200 MG CAPS Take 1 capsule by mouth daily.   ondansetron (ZOFRAN) 4 MG tablet Take 1 tablet (4 mg total) by mouth every 8 (eight) hours as needed for nausea or vomiting.   polyethylene glycol-electrolytes (GAVILYTE-N WITH FLAVOR PACK) 420 g solution Drink one 8 oz glass every 20 mins until entire container is finished starting at 5:00pm on 10/05/20    simethicone (MYLICON) 40 [id number] drops Take 0.6 mLs (40 mg total) by mouth 4 (four) times daily as needed for flatulence.   tiZANidine (ZANAFLEX) 4 MG tablet Take 4 mg by mouth as needed.   Vitamin E 134 MG (200 UNIT) TABS Take 1 tablet by mouth daily.   Zinc 50 MG CAPS Take 1 capsule by mouth daily.   No current facility-administered medications on file prior to visit.    Allergies:  No Known Allergies  Social History:  Social History   Socioeconomic History   Marital status: Divorced    Spouse name: Not on file   Number of children: Not on file   Years of education: Not on file   Highest education level: Not on file  Occupational History   Not on file  Tobacco Use   Smoking status: Former    Packs/day: 2.00    Years: 20.00    Pack years: 40.00    Types: Cigarettes    Quit date: 10/04/1998    Years since quitting: 22.2   Smokeless tobacco: Current    Types: Snuff   Tobacco comments:    dip occassionally  Vaping Use   Vaping Use: Never used  Substance and Sexual Activity   Alcohol use: Yes    Comment: occ (weekends)   Drug use: No    Types: Marijuana   Sexual activity: Not on file  Other Topics Concern   Not on file  Social History Narrative   Not on file   Social Determinants of Health   Financial Resource Strain: Not on file  Food Insecurity: Not on file  Transportation Needs: Not on file  Physical Activity: Not on file  Stress: Not on file  Social Connections: Not on file  Intimate Partner Violence: Not on file   Social History   Tobacco Use  Smoking Status Former   Packs/day: 2.00   Years: 20.00   Pack years: 40.00   Types: Cigarettes   Quit date: 10/04/1998   Years since quitting: 22.2  Smokeless Tobacco Current   Types: Snuff  Tobacco Comments   dip occassionally   Social History   Substance and Sexual Activity  Alcohol Use Yes   Comment: occ (weekends)    Family History:  Family History  Problem Relation Age of Onset    Hyperlipidemia Father    Hypertension Father    Heart disease Father    Heart attack Father 64    Past medical history, surgical history, medications, allergies, family history and social history reviewed with patient today and changes made to appropriate areas of the chart.   Review of Systems  Constitutional: Negative.   HENT: Negative.    Eyes: Negative.   Respiratory: Negative.    Cardiovascular: Negative.   Gastrointestinal: Negative.   Genitourinary: Negative.   Musculoskeletal: Negative.   Skin: Negative.   Neurological: Negative.   Endo/Heme/Allergies: Negative.   Psychiatric/Behavioral: Negative.    All other ROS  negative except what is listed above and in the HPI.      Objective:    BP 120/70   Pulse (!) 40   Temp 98.3 F (36.8 C) (Oral)   Ht 5' 9.02" (1.753 m)   Wt 208 lb (94.3 kg)   SpO2 96%   BMI 30.70 kg/m   Wt Readings from Last 3 Encounters:  12/13/20 208 lb (94.3 kg)  10/06/20 205 lb (93 kg)  09/19/20 210 lb 6.4 oz (95.4 kg)    Physical Exam Vitals and nursing note reviewed.  Constitutional:      General: He is not in acute distress.    Appearance: Normal appearance. He is obese. He is not ill-appearing, toxic-appearing or diaphoretic.  HENT:     Head: Normocephalic and atraumatic.     Right Ear: Tympanic membrane, ear canal and external ear normal. There is no impacted cerumen.     Left Ear: Tympanic membrane, ear canal and external ear normal. There is no impacted cerumen.     Nose: Nose normal. No congestion or rhinorrhea.     Mouth/Throat:     Mouth: Mucous membranes are moist.     Pharynx: Oropharynx is clear. No oropharyngeal exudate or posterior oropharyngeal erythema.  Eyes:     General: No scleral icterus.       Right eye: No discharge.        Left eye: No discharge.     Extraocular Movements: Extraocular movements intact.     Conjunctiva/sclera: Conjunctivae normal.     Pupils: Pupils are equal, round, and reactive to light.   Neck:     Vascular: No carotid bruit.  Cardiovascular:     Rate and Rhythm: Normal rate and regular rhythm.     Pulses: Normal pulses.     Heart sounds: No murmur heard.   No friction rub. No gallop.  Pulmonary:     Effort: Pulmonary effort is normal. No respiratory distress.     Breath sounds: Normal breath sounds. No stridor. No wheezing, rhonchi or rales.  Chest:     Chest wall: No tenderness.  Abdominal:     General: Abdomen is flat. Bowel sounds are normal. There is no distension.     Palpations: Abdomen is soft. There is no mass.     Tenderness: There is no abdominal tenderness. There is no right CVA tenderness, left CVA tenderness, guarding or rebound.     Hernia: No hernia is present.  Genitourinary:    Comments: Genital exam deferred with shared decision making Musculoskeletal:        General: No swelling, tenderness, deformity or signs of injury.     Cervical back: Normal range of motion and neck supple. No rigidity. No muscular tenderness.     Right lower leg: No edema.     Left lower leg: No edema.  Lymphadenopathy:     Cervical: No cervical adenopathy.  Skin:    General: Skin is warm and dry.     Capillary Refill: Capillary refill takes less than 2 seconds.     Coloration: Skin is not jaundiced or pale.     Findings: No bruising, erythema, lesion or rash.  Neurological:     General: No focal deficit present.     Mental Status: He is alert and oriented to person, place, and time.     Cranial Nerves: No cranial nerve deficit.     Sensory: No sensory deficit.     Motor: No weakness.  Coordination: Coordination normal.     Gait: Gait normal.     Deep Tendon Reflexes: Reflexes normal.  Psychiatric:        Mood and Affect: Mood normal.        Behavior: Behavior normal.        Thought Content: Thought content normal.        Judgment: Judgment normal.    Results for orders placed or performed in visit on 12/13/20  Bayer DCA Hb A1c Waived  Result Value Ref  Range   HB A1C (BAYER DCA - WAIVED) 5.8 (H) 4.8 - 5.6 %  Microalbumin, Urine Waived  Result Value Ref Range   Microalb, Ur Waived 30 (H) 0 - 19 mg/L   Creatinine, Urine Waived 200 10 - 300 mg/dL   Microalb/Creat Ratio <30 <30 mg/g  Urinalysis, Routine w reflex microscopic  Result Value Ref Range   Specific Gravity, UA 1.020 1.005 - 1.030   pH, UA 7.0 5.0 - 7.5   Color, UA Yellow Yellow   Appearance Ur Clear Clear   Leukocytes,UA Negative Negative   Protein,UA Negative Negative/Trace   Glucose, UA Negative Negative   Ketones, UA Negative Negative   RBC, UA Negative Negative   Bilirubin, UA Negative Negative   Urobilinogen, Ur 0.2 0.2 - 1.0 mg/dL   Nitrite, UA Negative Negative      Assessment & Plan:   Problem List Items Addressed This Visit       Cardiovascular and Mediastinum   Hypertension    Under good control on current regimen. Continue current regimen. Continue to monitor. Call with any concerns. Refills given. Labs drawn today.       Relevant Medications   losartan (COZAAR) 100 MG tablet   hydrochlorothiazide (HYDRODIURIL) 12.5 MG tablet   amLODipine (NORVASC) 2.5 MG tablet   atorvastatin (LIPITOR) 80 MG tablet   ezetimibe (ZETIA) 10 MG tablet   icosapent Ethyl (VASCEPA) 1 g capsule   rosuvastatin (CRESTOR) 5 MG tablet   Other Relevant Orders   Microalbumin, Urine Waived (Completed)   Comprehensive metabolic panel   CBC with Differential/Platelet   TSH   Aortic atherosclerosis (HCC)    Will keep BP and cholesterol under good control. Continue to monitor. Call with any concerns.       Relevant Medications   losartan (COZAAR) 100 MG tablet   hydrochlorothiazide (HYDRODIURIL) 12.5 MG tablet   amLODipine (NORVASC) 2.5 MG tablet   atorvastatin (LIPITOR) 80 MG tablet   ezetimibe (ZETIA) 10 MG tablet   icosapent Ethyl (VASCEPA) 1 g capsule   rosuvastatin (CRESTOR) 5 MG tablet   Other Relevant Orders   Comprehensive metabolic panel   CBC with  Differential/Platelet   Lipid Panel w/o Chol/HDL Ratio     Endocrine   Controlled type 2 diabetes mellitus with complication, without long-term current use of insulin (HCC)    Doing well with A1c of 5.6. Continue diet and exercise. Call with any concerns. Continue to monitor.       Relevant Medications   losartan (COZAAR) 100 MG tablet   atorvastatin (LIPITOR) 80 MG tablet   rosuvastatin (CRESTOR) 5 MG tablet   Other Relevant Orders   Bayer DCA Hb A1c Waived (Completed)   Microalbumin, Urine Waived (Completed)   Comprehensive metabolic panel   CBC with Differential/Platelet   Urinalysis, Routine w reflex microscopic (Completed)     Musculoskeletal and Integument   DDD (degenerative disc disease), lumbar   Relevant Medications   naproxen (NAPROSYN) 500 MG tablet  gabapentin (NEURONTIN) 300 MG capsule   colchicine 0.6 MG tablet   allopurinol (ZYLOPRIM) 300 MG tablet     Other   High cholesterol    Under good control on current regimen. Continue current regimen. Continue to monitor. Call with any concerns. Refills given. Labs drawn today.       Relevant Medications   losartan (COZAAR) 100 MG tablet   hydrochlorothiazide (HYDRODIURIL) 12.5 MG tablet   amLODipine (NORVASC) 2.5 MG tablet   atorvastatin (LIPITOR) 80 MG tablet   ezetimibe (ZETIA) 10 MG tablet   icosapent Ethyl (VASCEPA) 1 g capsule   rosuvastatin (CRESTOR) 5 MG tablet   Other Relevant Orders   Comprehensive metabolic panel   CBC with Differential/Platelet   Lipid Panel w/o Chol/HDL Ratio   Gout    Under good control on current regimen. Continue current regimen. Continue to monitor. Call with any concerns. Refills given. Labs drawn today.       Relevant Medications   naproxen (NAPROSYN) 500 MG tablet   allopurinol (ZYLOPRIM) 300 MG tablet   Other Relevant Orders   Comprehensive metabolic panel   CBC with Differential/Platelet   Uric acid   Other Visit Diagnoses     Routine general medical  examination at a health care facility    -  Primary   Vaccines up to date. Screening labs checked today. Colonoscopy up to date. Continue diet and exercise. Call with any concerns.    Screening for prostate cancer       Relevant Orders   PSA   Essential hypertension       Relevant Medications   losartan (COZAAR) 100 MG tablet   hydrochlorothiazide (HYDRODIURIL) 12.5 MG tablet   amLODipine (NORVASC) 2.5 MG tablet   atorvastatin (LIPITOR) 80 MG tablet   ezetimibe (ZETIA) 10 MG tablet   icosapent Ethyl (VASCEPA) 1 g capsule   rosuvastatin (CRESTOR) 5 MG tablet   Hyperlipidemia LDL goal <70       Relevant Medications   losartan (COZAAR) 100 MG tablet   hydrochlorothiazide (HYDRODIURIL) 12.5 MG tablet   amLODipine (NORVASC) 2.5 MG tablet   atorvastatin (LIPITOR) 80 MG tablet   ezetimibe (ZETIA) 10 MG tablet   icosapent Ethyl (VASCEPA) 1 g capsule   rosuvastatin (CRESTOR) 5 MG tablet        Discussed aspirin prophylaxis for myocardial infarction prevention and decision was made to continue ASA  LABORATORY TESTING:  Health maintenance labs ordered today as discussed above.   The natural history of prostate cancer and ongoing controversy regarding screening and potential treatment outcomes of prostate cancer has been discussed with the patient. The meaning of a false positive PSA and a false negative PSA has been discussed. He indicates understanding of the limitations of this screening test and wishes  to proceed with screening PSA testing.   IMMUNIZATIONS:   - Tdap: Tetanus vaccination status reviewed: Tdap vaccination indicated and given today. - Influenza: Postponed to flu season - Pneumovax: Administered today - Prevnar: Not applicable - COVID: Refused - Shingrix vaccine: Refused  SCREENING: - Colonoscopy: Up to date  Discussed with patient purpose of the colonoscopy is to detect colon cancer at curable precancerous or early stages   PATIENT COUNSELING:    Sexuality:  Discussed sexually transmitted diseases, partner selection, use of condoms, avoidance of unintended pregnancy  and contraceptive alternatives.   Advised to avoid cigarette smoking.  I discussed with the patient that most people either abstain from alcohol or drink within safe limits (<=14/week and <=  4 drinks/occasion for males, <=7/weeks and <= 3 drinks/occasion for females) and that the risk for alcohol disorders and other health effects rises proportionally with the number of drinks per week and how often a drinker exceeds daily limits.  Discussed cessation/primary prevention of drug use and availability of treatment for abuse.   Diet: Encouraged to adjust caloric intake to maintain  or achieve ideal body weight, to reduce intake of dietary saturated fat and total fat, to limit sodium intake by avoiding high sodium foods and not adding table salt, and to maintain adequate dietary potassium and calcium preferably from fresh fruits, vegetables, and low-fat dairy products.    stressed the importance of regular exercise  Injury prevention: Discussed safety belts, safety helmets, smoke detector, smoking near bedding or upholstery.   Dental health: Discussed importance of regular tooth brushing, flossing, and dental visits.   Follow up plan: NEXT PREVENTATIVE PHYSICAL DUE IN 1 YEAR. Return in about 6 months (around 06/12/2021).

## 2020-12-13 ENCOUNTER — Encounter: Payer: Self-pay | Admitting: Family Medicine

## 2020-12-13 ENCOUNTER — Ambulatory Visit (INDEPENDENT_AMBULATORY_CARE_PROVIDER_SITE_OTHER): Payer: BC Managed Care – PPO | Admitting: Family Medicine

## 2020-12-13 ENCOUNTER — Other Ambulatory Visit: Payer: Self-pay

## 2020-12-13 VITALS — BP 120/70 | HR 40 | Temp 98.3°F | Ht 69.02 in | Wt 208.0 lb

## 2020-12-13 DIAGNOSIS — M1000 Idiopathic gout, unspecified site: Secondary | ICD-10-CM | POA: Diagnosis not present

## 2020-12-13 DIAGNOSIS — Z0000 Encounter for general adult medical examination without abnormal findings: Secondary | ICD-10-CM

## 2020-12-13 DIAGNOSIS — I700 Atherosclerosis of aorta: Secondary | ICD-10-CM | POA: Diagnosis not present

## 2020-12-13 DIAGNOSIS — Z23 Encounter for immunization: Secondary | ICD-10-CM

## 2020-12-13 DIAGNOSIS — E785 Hyperlipidemia, unspecified: Secondary | ICD-10-CM

## 2020-12-13 DIAGNOSIS — Z125 Encounter for screening for malignant neoplasm of prostate: Secondary | ICD-10-CM | POA: Diagnosis not present

## 2020-12-13 DIAGNOSIS — E7800 Pure hypercholesterolemia, unspecified: Secondary | ICD-10-CM | POA: Diagnosis not present

## 2020-12-13 DIAGNOSIS — E118 Type 2 diabetes mellitus with unspecified complications: Secondary | ICD-10-CM | POA: Diagnosis not present

## 2020-12-13 DIAGNOSIS — I10 Essential (primary) hypertension: Secondary | ICD-10-CM | POA: Diagnosis not present

## 2020-12-13 DIAGNOSIS — M5136 Other intervertebral disc degeneration, lumbar region: Secondary | ICD-10-CM

## 2020-12-13 LAB — URINALYSIS, ROUTINE W REFLEX MICROSCOPIC
Bilirubin, UA: NEGATIVE
Glucose, UA: NEGATIVE
Ketones, UA: NEGATIVE
Leukocytes,UA: NEGATIVE
Nitrite, UA: NEGATIVE
Protein,UA: NEGATIVE
RBC, UA: NEGATIVE
Specific Gravity, UA: 1.02 (ref 1.005–1.030)
Urobilinogen, Ur: 0.2 mg/dL (ref 0.2–1.0)
pH, UA: 7 (ref 5.0–7.5)

## 2020-12-13 LAB — BAYER DCA HB A1C WAIVED: HB A1C (BAYER DCA - WAIVED): 5.8 % — ABNORMAL HIGH (ref 4.8–5.6)

## 2020-12-13 LAB — MICROALBUMIN, URINE WAIVED
Creatinine, Urine Waived: 200 mg/dL (ref 10–300)
Microalb, Ur Waived: 30 mg/L — ABNORMAL HIGH (ref 0–19)
Microalb/Creat Ratio: <30 mg/g (ref <30)

## 2020-12-13 MED ORDER — NAPROXEN 500 MG PO TABS: ORAL_TABLET | ORAL | 1 refills | Status: DC

## 2020-12-13 MED ORDER — GABAPENTIN 300 MG PO CAPS: 600.0000 mg | ORAL_CAPSULE | Freq: Three times a day (TID) | ORAL | 1 refills | Status: DC

## 2020-12-13 MED ORDER — OMEPRAZOLE 20 MG PO CPDR: 20.0000 mg | DELAYED_RELEASE_CAPSULE | Freq: Every day | ORAL | 1 refills | Status: DC

## 2020-12-13 MED ORDER — HYDROCHLOROTHIAZIDE 12.5 MG PO TABS: ORAL_TABLET | ORAL | 1 refills | Status: DC

## 2020-12-13 MED ORDER — ROSUVASTATIN CALCIUM 5 MG PO TABS: 5.0000 mg | ORAL_TABLET | ORAL | 3 refills | Status: DC

## 2020-12-13 MED ORDER — ALBUTEROL SULFATE HFA 108 (90 BASE) MCG/ACT IN AERS: INHALATION_SPRAY | RESPIRATORY_TRACT | 1 refills | Status: DC

## 2020-12-13 MED ORDER — ALLOPURINOL 300 MG PO TABS: 300.0000 mg | ORAL_TABLET | Freq: Every day | ORAL | 1 refills | Status: DC

## 2020-12-13 MED ORDER — AMLODIPINE BESYLATE 2.5 MG PO TABS: 2.5000 mg | ORAL_TABLET | Freq: Every day | ORAL | 1 refills | Status: DC

## 2020-12-13 MED ORDER — LORATADINE 10 MG PO TABS: 10.0000 mg | ORAL_TABLET | Freq: Every day | ORAL | 0 refills | Status: DC

## 2020-12-13 MED ORDER — EZETIMIBE 10 MG PO TABS: 10.0000 mg | ORAL_TABLET | Freq: Every day | ORAL | 1 refills | Status: DC

## 2020-12-13 MED ORDER — ATORVASTATIN CALCIUM 80 MG PO TABS: 80.0000 mg | ORAL_TABLET | Freq: Every day | ORAL | 1 refills | Status: DC

## 2020-12-13 MED ORDER — LOSARTAN POTASSIUM 100 MG PO TABS: ORAL_TABLET | ORAL | 1 refills | Status: DC

## 2020-12-13 MED ORDER — ICOSAPENT ETHYL 1 G PO CAPS: 2.0000 g | ORAL_CAPSULE | Freq: Two times a day (BID) | ORAL | 1 refills | Status: DC

## 2020-12-13 MED ORDER — COLCHICINE 0.6 MG PO TABS: 0.6000 mg | ORAL_TABLET | Freq: Every day | ORAL | 1 refills | Status: DC | PRN

## 2020-12-13 NOTE — Assessment & Plan Note (Signed)
Under good control on current regimen. Continue current regimen. Continue to monitor. Call with any concerns. Refills given. Labs drawn today.   

## 2020-12-13 NOTE — Assessment & Plan Note (Signed)
Will keep BP and cholesterol under good control. Continue to monitor. Call with any concerns.  

## 2020-12-13 NOTE — Assessment & Plan Note (Signed)
Doing well with A1c of 5.6. Continue diet and exercise. Call with any concerns. Continue to monitor.

## 2020-12-14 LAB — CBC WITH DIFFERENTIAL/PLATELET
Basophils Absolute: 0.1 10*3/uL (ref 0.0–0.2)
Basos: 1 %
EOS (ABSOLUTE): 0.2 10*3/uL (ref 0.0–0.4)
Eos: 3 %
Hematocrit: 41.2 % (ref 37.5–51.0)
Hemoglobin: 13.3 g/dL (ref 13.0–17.7)
Immature Grans (Abs): 0 10*3/uL (ref 0.0–0.1)
Immature Granulocytes: 0 %
Lymphocytes Absolute: 1.7 10*3/uL (ref 0.7–3.1)
Lymphs: 21 %
MCH: 30.7 pg (ref 26.6–33.0)
MCHC: 32.3 g/dL (ref 31.5–35.7)
MCV: 95 fL (ref 79–97)
Monocytes Absolute: 0.6 10*3/uL (ref 0.1–0.9)
Monocytes: 8 %
Neutrophils Absolute: 5.3 10*3/uL (ref 1.4–7.0)
Neutrophils: 67 %
Platelets: 260 10*3/uL (ref 150–450)
RBC: 4.33 x10E6/uL (ref 4.14–5.80)
RDW: 13.7 % (ref 11.6–15.4)
WBC: 7.9 10*3/uL (ref 3.4–10.8)

## 2020-12-14 LAB — LIPID PANEL W/O CHOL/HDL RATIO
Cholesterol, Total: 180 mg/dL (ref 100–199)
HDL: 54 mg/dL (ref >39)
LDL Chol Calc (NIH): 107 mg/dL — ABNORMAL HIGH (ref 0–99)
Triglycerides: 107 mg/dL (ref 0–149)
VLDL Cholesterol Cal: 19 mg/dL (ref 5–40)

## 2020-12-14 LAB — COMPREHENSIVE METABOLIC PANEL
ALT: 26 IU/L (ref 0–44)
AST: 22 IU/L (ref 0–40)
Albumin/Globulin Ratio: 1.8 (ref 1.2–2.2)
Albumin: 4.5 g/dL (ref 3.8–4.8)
Alkaline Phosphatase: 67 IU/L (ref 44–121)
BUN/Creatinine Ratio: 21 (ref 10–24)
BUN: 21 mg/dL (ref 8–27)
Bilirubin Total: 0.5 mg/dL (ref 0.0–1.2)
CO2: 26 mmol/L (ref 20–29)
Calcium: 9.7 mg/dL (ref 8.6–10.2)
Chloride: 100 mmol/L (ref 96–106)
Creatinine, Ser: 1 mg/dL (ref 0.76–1.27)
Globulin, Total: 2.5 g/dL (ref 1.5–4.5)
Glucose: 103 mg/dL — ABNORMAL HIGH (ref 65–99)
Potassium: 4.7 mmol/L (ref 3.5–5.2)
Sodium: 140 mmol/L (ref 134–144)
Total Protein: 7 g/dL (ref 6.0–8.5)
eGFR: 85 mL/min/{1.73_m2} (ref >59)

## 2020-12-14 LAB — TSH: TSH: 1.62 u[IU]/mL (ref 0.450–4.500)

## 2020-12-14 LAB — URIC ACID: Uric Acid: 4.6 mg/dL (ref 3.8–8.4)

## 2020-12-14 LAB — PSA: Prostate Specific Ag, Serum: 0.4 ng/mL (ref 0.0–4.0)

## 2020-12-26 ENCOUNTER — Other Ambulatory Visit: Payer: Self-pay

## 2020-12-26 ENCOUNTER — Ambulatory Visit (INDEPENDENT_AMBULATORY_CARE_PROVIDER_SITE_OTHER): Payer: BC Managed Care – PPO | Admitting: Cardiovascular Disease

## 2020-12-26 ENCOUNTER — Telehealth: Payer: Self-pay | Admitting: Cardiovascular Disease

## 2020-12-26 ENCOUNTER — Encounter: Payer: Self-pay | Admitting: Cardiovascular Disease

## 2020-12-26 VITALS — BP 138/62 | HR 44 | Ht 69.0 in | Wt 214.1 lb

## 2020-12-26 DIAGNOSIS — I700 Atherosclerosis of aorta: Secondary | ICD-10-CM

## 2020-12-26 DIAGNOSIS — Z79899 Other long term (current) drug therapy: Secondary | ICD-10-CM | POA: Diagnosis not present

## 2020-12-26 DIAGNOSIS — E7800 Pure hypercholesterolemia, unspecified: Secondary | ICD-10-CM

## 2020-12-26 DIAGNOSIS — Z87891 Personal history of nicotine dependence: Secondary | ICD-10-CM

## 2020-12-26 DIAGNOSIS — I10 Essential (primary) hypertension: Secondary | ICD-10-CM

## 2020-12-26 DIAGNOSIS — Z720 Tobacco use: Secondary | ICD-10-CM

## 2020-12-26 DIAGNOSIS — R001 Bradycardia, unspecified: Secondary | ICD-10-CM | POA: Diagnosis not present

## 2020-12-26 DIAGNOSIS — M5416 Radiculopathy, lumbar region: Secondary | ICD-10-CM | POA: Diagnosis not present

## 2020-12-26 DIAGNOSIS — E118 Type 2 diabetes mellitus with unspecified complications: Secondary | ICD-10-CM

## 2020-12-26 NOTE — Telephone Encounter (Signed)
Call transferred directly to this RN from scheduling team.  The patient called with reported HR's in the 30's/40's at recent office visits with other providers.  He states he saw his PCP on 12/13/20- HR 40 (had to be taken manually) Today, he was seen by Physical Medicine- HR 38 (again had to be taken manually).  He was seen by Dr. Quentin Ore & Dr. 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 HR's were 42 & 38 at those visits.  He is concerned that this is still running low. Currently he is reporting fatigue, but no real worsening of symptoms.  I inquired if he obtained a Ecologist as per Dr. Donivan Scull last office note to track his HR for chronotropic incompetence. He advised he did and "sent these in." I do not currently see any of these readings in his chart, but he advised that at one point, his HR went up to 160 bpm, but [place].  I do not see that any procedures/ medications were given at physical medicine today.  The patient is requesting an appointment to follow up.  I have advised that Dr. Rockey Situ has an opening today at 3:20 pm and the patient is agreeable to coming in at that time.

## 2020-12-26 NOTE — Telephone Encounter (Signed)
STAT if HR is under 50 or over 120 (normal HR is 60-100 beats per minute)  What is your heart rate? 30's -40's   Do you have a log of your heart rate readings (document readings)? No advised at recent visits with other offices to eval with Cardio.    Do you have any other symptoms? fatigue

## 2020-12-26 NOTE — Progress Notes (Signed)
Cardiology Office Note  Date:  12/26/2020   ID:  Daniel, Cook Mar 09, 1956, MRN [id number]  PCP:  Daniel Roys, DO   Chief Complaint  Patient presents with   Bradycardia    Patient c/o bradycardia with feeling exhausted. Medications reviewed by the patient verbally.     HPI:  [name] is a 65 year old gentleman with past medical history of Hypertension Sleep apnea on CPAP Hyperlipidemia Former smoker Chronic bradycardia rate in the 40 bpm CT coronary calcium scoring 185 in February 2022 Who presents for routine follow-up of his coronary calcification, bradycardia  Continues to work in [employer] in [place] Has been asymptomatic Denies chest pain or shortness of breath No regular exercise program Reports he is changed his diet, weight trending downward  Was told heart rate was low review with him on today's visit of prior heart rates typically low 40s with good blood pressure, asymptomatic  Lab work reviewed A1C 5.8  Heart rate in 2019 in the low 40s July 26, 2020, Heart rate of 40  Aug 08, 2020 was 51  Dr. Quentin Ore Aug 16, 2020 heart rate 42  Stress test was ordered for atypical chest pain Concern for bradycardia and dizziness Did not go through with a stress test, was too expensive  Had COVID February 2022, severe symptoms, was still recovering Reported intermittent nagging in the left chest  ZIO previously performed showing brief episodes of nonsustained atrial tachycardia  Could not afford cardiac CTA Stress test: too expensive  For unclear reasons was still on amlodipine 10 mg daily Will change to 2.5 mg he reports  Lab work reviewed with him Total chol 178, LDL 95 A1C 6.9  EKG personally reviewed by myself on todays visit Sinus bradycardia rate 44 bpm  Other past medical history reviewed Echo March 2022 Normal LV function, normal RV function, no significant valve disease   PMH:   has a past medical history of COPD (chronic  obstructive pulmonary disease) ([place]), COVID-19 (12/2019), Gout, High cholesterol, Hypertension, Obesity, Sleep apnea, and Vertigo.  PSH:    Past Surgical History:  Procedure Laterality Date   COLONOSCOPY WITH PROPOFOL N/A 10/06/2020   Procedure: COLONOSCOPY WITH PROPOFOL;  Surgeon: Lucilla Lame, MD;  Location: [facility];  Service: Endoscopy;  Laterality: N/A;   ESOPHAGOGASTRODUODENOSCOPY (EGD) WITH PROPOFOL N/A 10/06/2020   Procedure: ESOPHAGOGASTRODUODENOSCOPY (EGD) WITH PROPOFOL;  Surgeon: Lucilla Lame, MD;  Location: [facility];  Service: Endoscopy;  Laterality: N/A;   knee surgeries      Current Outpatient Medications  Medication Sig Dispense Refill   albuterol (VENTOLIN HFA) 108 (90 Base) MCG/ACT inhaler INHALE 1-2 PUFFS INTO THE LUNGS EVERY 6 HOURS AS NEEDED FOR WHEEZING/ SHORTNESS OF BREATH 8.5 g 1   allopurinol (ZYLOPRIM) 300 MG tablet Take 1 tablet (300 mg total) by mouth daily. 90 tablet 1   amLODipine (NORVASC) 2.5 MG tablet Take 1 tablet (2.5 mg total) by mouth daily. 90 tablet 1   Ascorbic Acid (VITAMIN C) 1000 MG tablet Take 1,000 mg by mouth daily.     Aspirin 81 MG CAPS Take 81 mg by mouth daily.     atorvastatin (LIPITOR) 80 MG tablet Take 1 tablet (80 mg total) by mouth daily. 90 tablet 1   budesonide-formoterol (SYMBICORT) 160-4.5 MCG/ACT inhaler Inhale 2 puffs into the lungs 2 (two) times daily. 3 each 3   Cholecalciferol (VITAMIN D3) 25 MCG (1000 UT) CAPS Take 1 capsule by mouth daily.     colchicine 0.6  MG tablet Take 1 tablet (0.6 mg total) by mouth daily as needed. 90 tablet 1   ezetimibe (ZETIA) 10 MG tablet Take 1 tablet (10 mg total) by mouth daily. 90 tablet 1   gabapentin (NEURONTIN) 300 MG capsule Take 2 capsules (600 mg total) by mouth 3 (three) times daily. 540 capsule 1   Glucosamine-Chondroit-Biofl-Mn (GLUCOSAMINE CHONDROIT,BIOFLAV, PO) Take 1 capsule by mouth daily.     hydrochlorothiazide (HYDRODIURIL) 12.5 MG tablet TAKE ONE TABLET (12.5  MG) BY MOUTH EVERYDAY 90 tablet 1   HYDROcodone-acetaminophen (NORCO/VICODIN) 5-325 MG tablet 1 po tid prn 15 tablet 0   icosapent Ethyl (VASCEPA) 1 g capsule Take 2 capsules (2 g total) by mouth 2 (two) times daily. 360 capsule 1   loratadine (CLARITIN) 10 MG tablet Take 1 tablet (10 mg total) by mouth daily. 90 tablet 0   losartan (COZAAR) 100 MG tablet TAKE ONE TABLET (100 MG) BY MOUTH EVERY DAY 90 tablet 1   magnesium gluconate (MAGONATE) 500 MG tablet Take 500 mg by mouth daily.     naproxen (NAPROSYN) 500 MG tablet TAKE ONE TABLET TWICE DAILY WITH A MEAL 180 tablet 1   Omega-3 Fatty Acids (FISH OIL) 1200 MG CAPS Take 1 capsule by mouth daily.     omeprazole (PRILOSEC) 20 MG capsule Take 1 capsule (20 mg total) by mouth daily. 90 capsule 1   ondansetron (ZOFRAN) 4 MG tablet Take 1 tablet (4 mg total) by mouth every 8 (eight) hours as needed for nausea or vomiting. 30 tablet 0   polyethylene glycol-electrolytes (GAVILYTE-N WITH FLAVOR PACK) 420 g solution Drink one 8 oz glass every 20 mins until entire container is finished starting at 5:00pm on 10/05/20 4000 mL 0   rosuvastatin (CRESTOR) 5 MG tablet Take 1 tablet (5 mg total) by mouth 3 (three) times a week. 39 tablet 3   simethicone (MYLICON) 40 IO/2.7OJ drops Take 0.6 mLs (40 mg total) by mouth 4 (four) times daily as needed for flatulence. 120 mL 1   tiZANidine (ZANAFLEX) 4 MG tablet Take 4 mg by mouth as needed.     Vitamin E 134 MG (200 UNIT) TABS Take 1 tablet by mouth daily.     Zinc 50 MG CAPS Take 1 capsule by mouth daily.     No current facility-administered medications for this visit.     Allergies:   Patient has no known allergies.   Social History:  The patient  reports that he quit smoking about 22 years ago. His smoking use included cigarettes. He has a 40.00 pack-year smoking history. His smokeless tobacco use includes snuff. He reports current alcohol use. He reports that he does not use drugs.   Family History:   family  history includes Heart attack (age of onset: 60) in his father; Heart disease in his father; Hyperlipidemia in his father; Hypertension in his father.    Review of Systems: Review of Systems  Constitutional: Negative.   HENT: Negative.    Respiratory: Negative.    Cardiovascular: Negative.   Gastrointestinal: Negative.   Musculoskeletal: Negative.   Neurological: Negative.   Psychiatric/Behavioral: Negative.    All other systems reviewed and are negative.  PHYSICAL EXAM: VS:  BP 138/62 (BP Location: Left Arm, Patient Position: Sitting, Cuff Size: Normal)   Pulse (!) 44   Ht 5\' 9"  (1.753 m)   Wt 214 lb 2 oz (97.1 kg)   SpO2 98%   BMI 31.62 kg/m  , BMI Body mass index is 31.62  kg/m. Constitutional:  oriented to person, place, and time. No distress.  HENT:  Head: Grossly normal Eyes:  no discharge. No scleral icterus.  Neck: No JVD, no carotid bruits  Cardiovascular: Regular rate and rhythm, no murmurs appreciated Pulmonary/Chest: Clear to auscultation bilaterally, no wheezes or rails Abdominal: Soft.  no distension.  no tenderness.  Musculoskeletal: Normal range of motion Neurological:  normal muscle tone. Coordination normal. No atrophy Skin: Skin warm and dry Psychiatric: normal affect, pleasant  Recent Labs: 06/02/2020: Magnesium 2.0 12/13/2020: ALT 26; BUN 21; Creatinine, Ser 1.00; Hemoglobin 13.3; Platelets 260; Potassium 4.7; Sodium 140; TSH 1.620    Lipid Panel Lab Results  Component Value Date   CHOL 180 12/13/2020   HDL 54 12/13/2020   LDLCALC 107 (H) 12/13/2020   TRIG 107 12/13/2020      Wt Readings from Last 3 Encounters:  12/26/20 214 lb 2 oz (97.1 kg)  12/13/20 208 lb (94.3 kg)  10/06/20 205 lb (93 kg)      ASSESSMENT AND PLAN:  Problem List Items Addressed This Visit       Cardiology Problems   High cholesterol   Hypertension   Aortic atherosclerosis (HCC)     Other   Controlled type 2 diabetes mellitus with complication, without  long-term current use of insulin (HCC)   Smokeless tobacco use   Former smoker   Bradycardia - Primary   Relevant Orders   EKG 12-Lead   Coronary calcification Low calcium score 185 Cholesterol at goal  Bradycardia asymptomatic, seen by EP,  No near syncope or syncope No indication for pacemaker  Essential hypertension Blood pressure is well controlled on today's visit. No changes made to the medications.  Hyperlipidemia Cholesterol at goal  Obstructive sleep apnea on CPAP Compliant with a CPAP   Total encounter time more than 25 minutes  Greater than 50% was spent in counseling and coordination of care with the patient  Signed, Esmond Plants, M.D., Ph.D. [place], [place]

## 2020-12-26 NOTE — Patient Instructions (Addendum)
Medication Instructions:  No changes  If you need a refill on your cardiac medications before your next appointment, please call your pharmacy.   Lab work: No new labs needed  Testing/Procedures: No new testing needed  Follow-Up: At [facility], you and your health needs are our priority.  As part of our continuing mission to provide you with exceptional heart care, we have created designated Provider Care Teams.  These Care Teams include your primary Cardiologist (physician) and Advanced Practice Providers (APPs -  Physician Assistants and Nurse Practitioners) who all work together to provide you with the care you need, when you need it.  You will need a follow up appointment in 12 months  Providers on your designated Care Team:   Christopher Berge, NP Ryan Dunn, PA-C Jacquelyn Visser, PA-C Cadence Furth, PA-C  COVID-19 Vaccine Information can be found at: [email or web address] For questions related to vaccine distribution or appointments, please email [email or web address] or call [phone].    

## 2021-02-20 ENCOUNTER — Ambulatory Visit: Payer: BC Managed Care – PPO | Admitting: Internal Medicine

## 2021-03-15 ENCOUNTER — Other Ambulatory Visit: Payer: Self-pay | Admitting: Family Medicine

## 2021-03-20 DIAGNOSIS — M5412 Radiculopathy, cervical region: Secondary | ICD-10-CM | POA: Diagnosis not present

## 2021-03-20 DIAGNOSIS — M5136 Other intervertebral disc degeneration, lumbar region: Secondary | ICD-10-CM | POA: Diagnosis not present

## 2021-03-20 DIAGNOSIS — R29890 Loss of height: Secondary | ICD-10-CM | POA: Diagnosis not present

## 2021-03-20 DIAGNOSIS — M5416 Radiculopathy, lumbar region: Secondary | ICD-10-CM | POA: Diagnosis not present

## 2021-03-20 DIAGNOSIS — M5126 Other intervertebral disc displacement, lumbar region: Secondary | ICD-10-CM | POA: Diagnosis not present

## 2021-03-20 DIAGNOSIS — M5030 Other cervical disc degeneration, unspecified cervical region: Secondary | ICD-10-CM | POA: Diagnosis not present

## 2021-03-20 DIAGNOSIS — Z79899 Other long term (current) drug therapy: Secondary | ICD-10-CM | POA: Diagnosis not present

## 2021-03-21 ENCOUNTER — Other Ambulatory Visit: Payer: Self-pay | Admitting: Family Medicine

## 2021-03-21 DIAGNOSIS — M25562 Pain in left knee: Secondary | ICD-10-CM | POA: Diagnosis not present

## 2021-03-21 NOTE — Telephone Encounter (Signed)
Requested Prescriptions  Pending Prescriptions Disp Refills   loratadine (CLARITIN) 10 MG tablet [Pharmacy Med Name: LORATADINE 10 MG TAB] 90 tablet 0    Sig: TAKE ONE TABLET (10 MG) BY MOUTH EVERY DAY     Ear, Nose, and Throat:  Antihistamines Passed - 03/21/2021  8:12 AM      Passed - Valid encounter within last 12 months    Recent Outpatient Visits          3 months ago Routine general medical examination at a health care facility   [facility], [place] P, DO   6 months ago Controlled type 2 diabetes mellitus with complication, without long-term current use of insulin ([place])   [place], Megan P, DO   9 months ago Multifocal pneumonia   [facility] [place], Megan P, DO   10 months ago Routine general medical examination at a health care facility   [place], [place], DO   1 year ago Personal history of covid-19   [facility] Eulogio Bear, NP      Future Appointments            In 1 month Parrett, Fonnie Mu, NP [facility]   In 2 months Wynetta Emery, Barb Merino, DO [employer], [facility]

## 2021-04-04 DIAGNOSIS — M25562 Pain in left knee: Secondary | ICD-10-CM | POA: Diagnosis not present

## 2021-04-08 DIAGNOSIS — J8410 Pulmonary fibrosis, unspecified: Secondary | ICD-10-CM

## 2021-04-08 DIAGNOSIS — Z20822 Contact with and (suspected) exposure to covid-19: Secondary | ICD-10-CM | POA: Diagnosis not present

## 2021-04-08 HISTORY — DX: Pulmonary fibrosis, unspecified: J84.10

## 2021-04-11 DIAGNOSIS — M25562 Pain in left knee: Secondary | ICD-10-CM | POA: Diagnosis not present

## 2021-04-27 DIAGNOSIS — M179 Osteoarthritis of knee, unspecified: Secondary | ICD-10-CM | POA: Diagnosis not present

## 2021-04-27 DIAGNOSIS — M25562 Pain in left knee: Secondary | ICD-10-CM | POA: Diagnosis not present

## 2021-05-01 DIAGNOSIS — M5412 Radiculopathy, cervical region: Secondary | ICD-10-CM | POA: Diagnosis not present

## 2021-05-01 DIAGNOSIS — M5030 Other cervical disc degeneration, unspecified cervical region: Secondary | ICD-10-CM | POA: Diagnosis not present

## 2021-05-01 DIAGNOSIS — M5416 Radiculopathy, lumbar region: Secondary | ICD-10-CM | POA: Diagnosis not present

## 2021-05-03 ENCOUNTER — Other Ambulatory Visit: Payer: Self-pay | Admitting: Family Medicine

## 2021-05-03 DIAGNOSIS — M5412 Radiculopathy, cervical region: Secondary | ICD-10-CM

## 2021-05-04 DIAGNOSIS — M25562 Pain in left knee: Secondary | ICD-10-CM | POA: Diagnosis not present

## 2021-05-04 DIAGNOSIS — M179 Osteoarthritis of knee, unspecified: Secondary | ICD-10-CM | POA: Diagnosis not present

## 2021-05-07 ENCOUNTER — Ambulatory Visit
Admission: RE | Admit: 2021-05-07 | Discharge: 2021-05-07 | Disposition: A | Discharge status: Home / Self Care | Payer: BC Managed Care – PPO | Source: Ambulatory Visit | Attending: Family Medicine | Admitting: Family Medicine

## 2021-05-07 DIAGNOSIS — M4802 Spinal stenosis, cervical region: Secondary | ICD-10-CM | POA: Diagnosis not present

## 2021-05-07 DIAGNOSIS — R200 Anesthesia of skin: Secondary | ICD-10-CM | POA: Diagnosis not present

## 2021-05-07 DIAGNOSIS — M5412 Radiculopathy, cervical region: Secondary | ICD-10-CM

## 2021-05-08 ENCOUNTER — Other Ambulatory Visit: Payer: Self-pay

## 2021-05-08 ENCOUNTER — Encounter: Payer: Self-pay | Admitting: Adult Health

## 2021-05-08 ENCOUNTER — Telehealth: Payer: Self-pay

## 2021-05-08 ENCOUNTER — Ambulatory Visit (INDEPENDENT_AMBULATORY_CARE_PROVIDER_SITE_OTHER): Payer: BC Managed Care – PPO | Admitting: Adult Health

## 2021-05-08 DIAGNOSIS — R001 Bradycardia, unspecified: Secondary | ICD-10-CM

## 2021-05-08 DIAGNOSIS — J449 Chronic obstructive pulmonary disease, unspecified: Secondary | ICD-10-CM

## 2021-05-08 DIAGNOSIS — J8410 Pulmonary fibrosis, unspecified: Secondary | ICD-10-CM | POA: Diagnosis not present

## 2021-05-08 DIAGNOSIS — G4733 Obstructive sleep apnea (adult) (pediatric): Secondary | ICD-10-CM | POA: Diagnosis not present

## 2021-05-08 MED ORDER — BUDESONIDE-FORMOTEROL FUMARATE 160-4.5 MCG/ACT IN AERO: 2.0000 | INHALATION_SPRAY | Freq: Two times a day (BID) | RESPIRATORY_TRACT | 3 refills | Status: DC

## 2021-05-08 MED ORDER — ALBUTEROL SULFATE HFA 108 (90 BASE) MCG/ACT IN AERS: INHALATION_SPRAY | RESPIRATORY_TRACT | 2 refills | Status: DC

## 2021-05-08 NOTE — Telephone Encounter (Signed)
Rx for Symbicort and Ventolin refilled per Daniel Cook, during office visit.

## 2021-05-08 NOTE — Assessment & Plan Note (Signed)
Notable chronic bradycardia.  Patient is continue follow-up with cardiology.  Asymptomatic

## 2021-05-08 NOTE — Assessment & Plan Note (Signed)
COPD controlled on current regimen. Continue on Symbicort twice daily.  Patient declines flu and COVID-vaccine.  Pneumovax is up-to-date  Plan  Patient Instructions  Continue on CPAP At bedtime   Keep up good work  Work on healthy weight  Do not drive if sleepy   CT chest  Continue on Symbicort 2 puffs Twice daily  , rinse after use.  Albuterol inhaler As needed   Activity as tolerated.   Follow up with with Dr. Mortimer Fries in 6 months and As needed

## 2021-05-08 NOTE — Patient Instructions (Addendum)
Continue on CPAP At bedtime   Keep up good work  Work on [employer]  Do not drive if sleepy   CT chest  Continue on Symbicort 2 puffs Twice daily  , rinse after use.  Albuterol inhaler As needed   Activity as tolerated.   Follow up with with Dr. Mortimer Fries in 6 months and As needed

## 2021-05-08 NOTE — Telephone Encounter (Signed)
Dr Mortimer Fries, just an FYI.   Patient came into office today for a ROV, he was supposed to have a Ct scan per you last office note and states he canceled it because he states its too expensive. Nothing further.

## 2021-05-08 NOTE — Assessment & Plan Note (Signed)
Postinflammatory fibrosis changes on CT chest after COVID-19.  Patient has a planned CT chest that is pending. Continue to follow.  Appears to be clinically stable.

## 2021-05-08 NOTE — Assessment & Plan Note (Signed)
Moderate obstructive sleep apnea with excellent compliance and control on nocturnal CPAP.  Patient is encouraged to contact his DME company to discuss different mask options. Continue on current settings.  CPAP education was given  Plan  Patient Instructions  Continue on CPAP At bedtime   Keep up good work  Work on healthy weight  Do not drive if sleepy   CT chest  Continue on Symbicort 2 puffs Twice daily  , rinse after use.  Albuterol inhaler As needed   Activity as tolerated.   Follow up with with Dr. Mortimer Fries in 6 months and As needed

## 2021-05-08 NOTE — Progress Notes (Signed)
@Patient  ID: Daniel Cook, male    DOB: January 04, 1956, 67 y.o.   MRN: [id number]  Chief Complaint  Patient presents with   Follow-up    Referring provider: Valerie Roys, DO  HPI: 66 year old male former smoker followed for COPD and obstructive sleep apnea 12/2019 COVID-19 infection with postinflammatory changes on CT scan  TEST/EVENTS :  2015 PSG--AHI 27 Supine/REM  Cardiac CT May 30, 2020 showed resolving Multi lobar bilateral pneumonia with peripheral areas of postinfectious or inflammatory scarring.  PFT June 30, 2020 FEV1 68%, 72 ratio, FVC 71%, no significant bronchodilator response, DLCO 75%.  05/08/2021 Follow up : COPD , OSA , Abnormal CT chest  Patient presents for a 58-month follow-up.  Patient has underlying obstructive sleep apnea.  He is on nocturnal CPAP.  Patient says he is doing very well on CPAP.  Wears a CPAP every single night.  Feels that he benefits from CPAP.  Has decreased daytime sleepiness.  Patient says he cannot sleep without his CPAP.  He usually wears a CPAP about 7 to 8 hours.  CPAP download shows excellent compliance with 90% compliance.  Daily average usage at 8 hours.  Patient is on auto CPAP 5 to 15 cm H2O.  Daily average pressure at 13 cm H2O.  AHI 1.2. Wears full face mask. Wants to try different one, we discussed going to DME for mask fitting.   Patient is a former smoker.  Previous PFT 06/2020 showed moderate restrictive process and decreased diffusing capacity .  Patient is maintained on Symbicort twice daily.  Says overall breathing is doing As above patient did have COVID-19 infection in September 2021.  He was treated in the outpatient setting.  Patient has some lingering symptoms of cough and shortness of breath.  CT chest February 2022 showed a resolving Multi lobar pneumonia with peripheral areas of postinfectious/postinflammatory scarring.    Repeat CT chest is pending .   Patient declines COVID and flu -vaccine.  Pneumonia vaccine is  up-to-date. .   Patient's activity level is good usually , currently in PT for knee issues.  Works Biochemist, clinical.    No Known Allergies  Immunization History  Administered Date(s) Administered   Influenza,inj,Quad PF,6+ Mos 12/26/2015, 02/04/2017   Pneumococcal Polysaccharide-23 12/13/2020   Tdap 12/19/2010, 12/13/2020    Past Medical History:  Diagnosis Date   COPD (chronic obstructive pulmonary disease) ([place])    COVID-19 12/2019   Has seen pulmonology for "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   Gout    High cholesterol    Hypertension    Obesity    Sleep apnea    CPAP   Vertigo     Tobacco History: Social History   Tobacco Use  Smoking Status Former   Packs/day: 2.00   Years: 20.00   Pack years: 40.00   Types: Cigarettes   Quit date: 10/04/1998   Years since quitting: 22.6  Smokeless Tobacco Current   Types: Snuff  Tobacco Comments   dip occassionally   Ready to quit: Not Answered Counseling given: Not Answered Tobacco comments: dip occassionally   Outpatient Medications Prior to Visit  Medication Sig Dispense Refill   allopurinol (ZYLOPRIM) 300 MG tablet Take 1 tablet (300 mg total) by mouth daily. 90 tablet 1   amLODipine (NORVASC) 2.5 MG tablet Take 1 tablet (2.5 mg total) by mouth daily. 90 tablet 1   Ascorbic Acid (VITAMIN C) 1000 MG tablet Take 1,000 mg by mouth daily.     Aspirin 81 MG CAPS  Take 81 mg by mouth daily.     atorvastatin (LIPITOR) 80 MG tablet Take 1 tablet (80 mg total) by mouth daily. 90 tablet 1   Cholecalciferol (VITAMIN D3) 25 MCG (1000 UT) CAPS Take 1 capsule by mouth daily.     colchicine 0.6 MG tablet Take 1 tablet (0.6 mg total) by mouth daily as needed. 90 tablet 1   ezetimibe (ZETIA) 10 MG tablet Take 1 tablet (10 mg total) by mouth daily. 90 tablet 1   gabapentin (NEURONTIN) 300 MG capsule Take 2 capsules (600 mg total) by mouth 3 (three) times daily. 540 capsule 1   Glucosamine-Chondroit-Biofl-Mn (GLUCOSAMINE CHONDROIT,BIOFLAV, PO) Take 1  capsule by mouth daily.     hydrochlorothiazide (HYDRODIURIL) 12.5 MG tablet TAKE ONE TABLET (12.5 MG) BY MOUTH EVERYDAY 90 tablet 1   HYDROcodone-acetaminophen (NORCO/VICODIN) 5-325 MG tablet 1 po tid prn 15 tablet 0   loratadine (CLARITIN) 10 MG tablet TAKE ONE TABLET (10 MG) BY MOUTH EVERY DAY 90 tablet 0   losartan (COZAAR) 100 MG tablet TAKE ONE TABLET (100 MG) BY MOUTH EVERY DAY 90 tablet 1   magnesium gluconate (MAGONATE) 500 MG tablet Take 500 mg by mouth daily.     naproxen (NAPROSYN) 500 MG tablet TAKE ONE TABLET TWICE DAILY WITH A MEAL 180 tablet 1   Omega-3 Fatty Acids (FISH OIL) 1200 MG CAPS Take 1 capsule by mouth daily.     omeprazole (PRILOSEC) 20 MG capsule Take 1 capsule (20 mg total) by mouth daily. 90 capsule 1   ondansetron (ZOFRAN) 4 MG tablet Take 1 tablet (4 mg total) by mouth every 8 (eight) hours as needed for nausea or vomiting. 30 tablet 0   polyethylene glycol-electrolytes (GAVILYTE-N WITH FLAVOR PACK) 420 g solution Drink one 8 oz glass every 20 mins until entire container is finished starting at 5:00pm on 10/05/20 4000 mL 0   simethicone (MYLICON) 40 JI/9.6VE drops Take 0.6 mLs (40 mg total) by mouth 4 (four) times daily as needed for flatulence. 120 mL 1   tiZANidine (ZANAFLEX) 4 MG tablet Take 4 mg by mouth as needed.     Vitamin E 134 MG (200 UNIT) TABS Take 1 tablet by mouth daily.     Zinc 50 MG CAPS Take 1 capsule by mouth daily.     albuterol (VENTOLIN HFA) 108 (90 Base) MCG/ACT inhaler INHALE 1-2 PUFFS INTO THE LUNGS EVERY 6 HOURS AS NEEDED FOR WHEEZING/ SHORTNESS OF BREATH 8.5 g 1   budesonide-formoterol (SYMBICORT) 160-4.5 MCG/ACT inhaler Inhale 2 puffs into the lungs 2 (two) times daily. 3 each 3   icosapent Ethyl (VASCEPA) 1 g capsule Take 2 capsules (2 g total) by mouth 2 (two) times daily. 360 capsule 1   rosuvastatin (CRESTOR) 5 MG tablet Take 1 tablet (5 mg total) by mouth 3 (three) times a week. 39 tablet 3   No facility-administered medications  prior to visit.     Review of Systems:   Constitutional:   No  weight loss, night sweats,  Fevers, chills, + fatigue, or  lassitude.  HEENT:   No headaches,  Difficulty swallowing,  Tooth/dental problems, or  Sore throat,                No sneezing, itching, ear ache, nasal congestion, post nasal drip,   CV:  No chest pain,  Orthopnea, PND, swelling in lower extremities, anasarca, dizziness, palpitations, syncope.   GI  No heartburn, indigestion, abdominal pain, nausea, vomiting, diarrhea, change in bowel habits,  loss of appetite, bloody stools.   Resp: No shortness of breath with exertion or at rest.  No excess mucus, no productive cough,  No non-productive cough,  No coughing up of blood.  No change in color of mucus.  No wheezing.  No chest wall deformity  Skin: no rash or lesions.  GU: no dysuria, change in color of urine, no urgency or frequency.  No flank pain, no hematuria   MS:  knee pain     Physical Exam  BP 118/72 (BP Location: Left Arm, Patient Position: Sitting, Cuff Size: Normal)    Pulse (!) 44    Temp (!) 97.5 F (36.4 C) (Oral)    Ht 5\' 8"  (1.727 m)    Wt 211 lb 9.6 oz (96 kg)    SpO2 97%    BMI 32.17 kg/m   GEN: A/Ox3; pleasant , NAD, well nourished    HEENT:  [place]/AT,  EACs-clear, TMs-wnl, NOSE-clear, THROAT-clear, no lesions, no postnasal drip or exudate noted. Class 2 MP airway   NECK:  Supple w/ fair ROM; no JVD; normal carotid impulses w/o bruits; no thyromegaly or nodules palpated; no lymphadenopathy.    RESP  Clear  P & A; w/o, wheezes/ rales/ or rhonchi. no accessory muscle use, no dullness to percussion  CARD:  RRR, no m/r/g, no peripheral edema, pulses intact, no cyanosis or clubbing.  GI:   Soft & nt; nml bowel sounds; no organomegaly or masses detected.   Musco: Warm bil, left knee brace   Neuro: alert, no focal deficits noted.    Skin: Warm, no lesions or rashes    Lab Results:    BMET   BNP No results found for:  BNP  ProBNP No results found for: PROBNP  Imaging:     No flowsheet data found.  No results found for: NITRICOXIDE      Assessment & Plan:   Sleep apnea Moderate obstructive sleep apnea with excellent compliance and control on nocturnal CPAP.  Patient is encouraged to contact his DME company to discuss different mask options. Continue on current settings.  CPAP education was given  Plan  Patient Instructions  Continue on CPAP At bedtime   Keep up good work  Work on healthy weight  Do not drive if sleepy   CT chest  Continue on Symbicort 2 puffs Twice daily  , rinse after use.  Albuterol inhaler As needed   Activity as tolerated.   Follow up with with Dr. Mortimer Fries in 6 months and As needed       COPD (chronic obstructive pulmonary disease) ([place]) COPD controlled on current regimen. Continue on Symbicort twice daily.  Patient declines flu and COVID-vaccine.  Pneumovax is up-to-date  Plan  Patient Instructions  Continue on CPAP At bedtime   Keep up good work  Work on healthy weight  Do not drive if sleepy   CT chest  Continue on Symbicort 2 puffs Twice daily  , rinse after use.  Albuterol inhaler As needed   Activity as tolerated.   Follow up with with Dr. Mortimer Fries in 6 months and As needed       Postinflammatory pulmonary fibrosis ([place]) Postinflammatory fibrosis changes on CT chest after COVID-19.  Patient has a planned CT chest that is pending. Continue to follow.  Appears to be clinically stable.  Bradycardia Notable chronic bradycardia.  Patient is continue follow-up with cardiology.  Asymptomatic     Margel Joens, NP 05/08/2021

## 2021-05-11 DIAGNOSIS — M25562 Pain in left knee: Secondary | ICD-10-CM | POA: Diagnosis not present

## 2021-05-11 DIAGNOSIS — M179 Osteoarthritis of knee, unspecified: Secondary | ICD-10-CM | POA: Diagnosis not present

## 2021-05-14 ENCOUNTER — Other Ambulatory Visit: Payer: Self-pay | Admitting: Family Medicine

## 2021-05-14 DIAGNOSIS — M5136 Other intervertebral disc degeneration, lumbar region: Secondary | ICD-10-CM

## 2021-05-14 NOTE — Telephone Encounter (Signed)
Has one refill left at same pharm Requested Prescriptions  Pending Prescriptions Disp Refills   gabapentin (NEURONTIN) 300 MG capsule [Pharmacy Med Name: GABAPENTIN 300 MG CAP] 540 capsule 1    Sig: TAKE 2 [place] DAILY     Neurology: Anticonvulsants - gabapentin Passed - 05/14/2021 12:19 PM      Passed - Cr in normal range and within 360 days    Creatinine, Ser  Date Value Ref Range Status  12/13/2020 1.00 0.76 - 1.27 mg/dL Final         Passed - Completed PHQ-2 or PHQ-9 in the last 360 days      Passed - Valid encounter within last 12 months    Recent Outpatient Visits          5 months ago Routine general medical examination at a health care facility   [facility], [place] P, DO   8 months ago Controlled type 2 diabetes mellitus with complication, without long-term current use of insulin ([place])   [place], Megan P, DO   11 months ago Multifocal pneumonia   [place], Megan P, DO   1 year ago Routine general medical examination at a health care facility   [place], [place], DO   1 year ago Personal history of covid-19   [facility] Eulogio Bear, NP      Future Appointments            In 4 weeks Wynetta Emery, Barb Merino, DO [facility], [facility]

## 2021-05-29 ENCOUNTER — Telehealth: Payer: Self-pay | Admitting: *Deleted

## 2021-05-29 NOTE — Telephone Encounter (Signed)
PA has been submitted via covermymeds. Awaiting approval.   Your information has been submitted to [employer]. [employer] will review the request and notify you of the determination decision directly, typically within 72 hours of receiving all information.  You will also receive your request decision electronically. To check for an update later, open this request again from your dashboard.  If [employer] [place] has not responded within the specified timeframe or if you have any questions about your PA submission, contact Sigel Coppock directly at [phone].

## 2021-06-08 ENCOUNTER — Other Ambulatory Visit: Payer: Self-pay | Admitting: Family Medicine

## 2021-06-08 DIAGNOSIS — E785 Hyperlipidemia, unspecified: Secondary | ICD-10-CM

## 2021-06-08 NOTE — Telephone Encounter (Signed)
Requested Prescriptions  ?Pending Prescriptions Disp Refills  ?? atorvastatin (LIPITOR) 80 MG tablet [Pharmacy Med Name: ATORVASTATIN CALCIUM 80 MG TAB] 90 tablet 0  ?  Sig: TAKE ONE TABLET BY MOUTH EVERY DAY  ?  ? Cardiovascular:  Antilipid - Statins Failed - 06/08/2021 10:20 AM  ?  ?  Failed - Lipid Panel in normal range within the last 12 months  ?  Cholesterol, Total  ?Date Value Ref Range Status  ?12/13/2020 180 100 - 199 mg/dL Final  ? ?Cholesterol [place], [place]  ?Date Value Ref Range Status  ?09/22/2018 212 (H) <200 mg/dL Final  ?  Comment:  ?                          Desirable                <200 ?                        Borderline High      200- 239 ?                        High                     >239 ?  ? ?LDL Chol Calc (NIH)  ?Date Value Ref Range Status  ?12/13/2020 107 (H) 0 - 99 mg/dL Final  ? ?HDL  ?Date Value Ref Range Status  ?12/13/2020 54 >39 mg/dL Final  ? ?Triglycerides  ?Date Value Ref Range Status  ?12/13/2020 107 0 - 149 mg/dL Final  ? ?Triglycerides Piccolo,Waived  ?Date Value Ref Range Status  ?09/22/2018 192 (H) <150 mg/dL Final  ?  Comment:  ?                          Normal                   <150 ?                        Borderline High     150 - 199 ?                        High                200 - 499 ?                        Very High                >499 ?  ? ?  ?  ?  Passed - Patient is not pregnant  ?  ?  Passed - Valid encounter within last 12 months  ?  Recent Outpatient Visits   ?      ? 5 months ago Routine general medical examination at a health care facility  ? [place], Megan P, DO  ? 8 months ago Controlled type 2 diabetes mellitus with complication, without long-term current use of insulin ([place])  ? [place], Megan P, DO  ? 12 months ago Multifocal pneumonia  ? [place], DO  ? 1 year ago Routine general medical examination at a health care facility  ? [facility] [place], [place]  P, DO  ? 1 year ago Personal history of covid-19  ? [facility] Eulogio Bear, NP  ?  ?  ?Future Appointments   ?        ? In 4 days Valerie Roys, DO [facility]  ?  ? ?  ?  ?  ? ?

## 2021-06-12 ENCOUNTER — Other Ambulatory Visit: Payer: Self-pay

## 2021-06-12 ENCOUNTER — Encounter: Payer: Self-pay | Admitting: Family Medicine

## 2021-06-12 ENCOUNTER — Ambulatory Visit (INDEPENDENT_AMBULATORY_CARE_PROVIDER_SITE_OTHER): Payer: BC Managed Care – PPO | Admitting: Family Medicine

## 2021-06-12 VITALS — BP 132/72 | HR 44 | Temp 98.1°F | Wt 211.8 lb

## 2021-06-12 DIAGNOSIS — E118 Type 2 diabetes mellitus with unspecified complications: Secondary | ICD-10-CM | POA: Diagnosis not present

## 2021-06-12 DIAGNOSIS — J8410 Pulmonary fibrosis, unspecified: Secondary | ICD-10-CM

## 2021-06-12 DIAGNOSIS — M1000 Idiopathic gout, unspecified site: Secondary | ICD-10-CM

## 2021-06-12 DIAGNOSIS — M5136 Other intervertebral disc degeneration, lumbar region: Secondary | ICD-10-CM

## 2021-06-12 DIAGNOSIS — J449 Chronic obstructive pulmonary disease, unspecified: Secondary | ICD-10-CM

## 2021-06-12 DIAGNOSIS — E7800 Pure hypercholesterolemia, unspecified: Secondary | ICD-10-CM | POA: Diagnosis not present

## 2021-06-12 DIAGNOSIS — Z0000 Encounter for general adult medical examination without abnormal findings: Secondary | ICD-10-CM | POA: Diagnosis not present

## 2021-06-12 DIAGNOSIS — E785 Hyperlipidemia, unspecified: Secondary | ICD-10-CM

## 2021-06-12 DIAGNOSIS — I700 Atherosclerosis of aorta: Secondary | ICD-10-CM | POA: Diagnosis not present

## 2021-06-12 DIAGNOSIS — I10 Essential (primary) hypertension: Secondary | ICD-10-CM | POA: Diagnosis not present

## 2021-06-12 LAB — BAYER DCA HB A1C WAIVED: HB A1C (BAYER DCA - WAIVED): 5.7 % — ABNORMAL HIGH (ref 4.8–5.6)

## 2021-06-12 MED ORDER — OMEPRAZOLE 20 MG PO CPDR: 20.0000 mg | DELAYED_RELEASE_CAPSULE | Freq: Every day | ORAL | 1 refills | Status: DC

## 2021-06-12 MED ORDER — BUDESONIDE-FORMOTEROL FUMARATE 160-4.5 MCG/ACT IN AERO: 2.0000 | INHALATION_SPRAY | Freq: Two times a day (BID) | RESPIRATORY_TRACT | 3 refills | Status: DC

## 2021-06-12 MED ORDER — HYDROCHLOROTHIAZIDE 12.5 MG PO TABS: ORAL_TABLET | ORAL | 1 refills | Status: DC

## 2021-06-12 MED ORDER — COLCHICINE 0.6 MG PO TABS: 0.6000 mg | ORAL_TABLET | Freq: Every day | ORAL | 1 refills | Status: DC | PRN

## 2021-06-12 MED ORDER — EZETIMIBE 10 MG PO TABS: 10.0000 mg | ORAL_TABLET | Freq: Every day | ORAL | 1 refills | Status: DC

## 2021-06-12 MED ORDER — ALLOPURINOL 300 MG PO TABS: 300.0000 mg | ORAL_TABLET | Freq: Every day | ORAL | 1 refills | Status: DC

## 2021-06-12 MED ORDER — ALBUTEROL SULFATE HFA 108 (90 BASE) MCG/ACT IN AERS: INHALATION_SPRAY | RESPIRATORY_TRACT | 2 refills | Status: DC

## 2021-06-12 MED ORDER — GABAPENTIN 300 MG PO CAPS: 600.0000 mg | ORAL_CAPSULE | Freq: Three times a day (TID) | ORAL | 1 refills | Status: DC

## 2021-06-12 MED ORDER — LORATADINE 10 MG PO TABS
ORAL_TABLET | ORAL | 3 refills | Status: DC
Start: 2021-06-12 — End: 2022-06-28

## 2021-06-12 MED ORDER — ROSUVASTATIN CALCIUM 5 MG PO TABS: 5.0000 mg | ORAL_TABLET | ORAL | 3 refills | Status: DC

## 2021-06-12 MED ORDER — NAPROXEN 500 MG PO TABS: ORAL_TABLET | ORAL | 1 refills | Status: DC

## 2021-06-12 MED ORDER — AMLODIPINE BESYLATE 2.5 MG PO TABS: 2.5000 mg | ORAL_TABLET | Freq: Every day | ORAL | 1 refills | Status: DC

## 2021-06-12 MED ORDER — ATORVASTATIN CALCIUM 80 MG PO TABS: 80.0000 mg | ORAL_TABLET | Freq: Every day | ORAL | 1 refills | Status: DC

## 2021-06-12 MED ORDER — ICOSAPENT ETHYL 1 G PO CAPS: 2.0000 g | ORAL_CAPSULE | Freq: Two times a day (BID) | ORAL | 1 refills | Status: DC

## 2021-06-12 MED ORDER — LOSARTAN POTASSIUM 100 MG PO TABS: ORAL_TABLET | ORAL | 1 refills | Status: DC

## 2021-06-12 NOTE — Assessment & Plan Note (Signed)
Under good control on current regimen. Continue current regimen. Continue to monitor. Call with any concerns. Refills given. Labs drawn today.   

## 2021-06-12 NOTE — Progress Notes (Signed)
BP 132/72    Pulse (!) 44    Temp 98.1 F (36.7 C)    Wt 211 lb 12.8 oz (96.1 kg)    SpO2 96%    BMI 32.20 kg/m    Subjective:    Patient ID: Daniel Cook, male    DOB: 09-Mar-1956, 66 y.o.   MRN: [id number]  HPI: Daniel Cook is a 66 y.o. male presenting on 06/12/2021 for comprehensive medical examination. Current medical complaints include:  Considering 2nd opinion. Has issue with his L knee- not resolving with PT.   HYPERTENSION / HYPERLIPIDEMIA Satisfied with current treatment? yes Duration of hypertension: chronic BP monitoring frequency: not checking BP medication side effects: no Past BP meds: amlodipine, losartan, HCTZ Duration of hyperlipidemia: chronic Cholesterol medication side effects: no Cholesterol supplements: fish oil Past cholesterol medications: atorvastatin and crestor and vesepa Medication compliance: excellent compliance Aspirin: yes Recent stressors: no Recurrent headaches: no Visual changes: no Palpitations: no Dyspnea: no Chest pain: no Lower extremity edema: no Dizzy/lightheaded: no  No gout flares. Tolerating his medicine well.  DIABETES Hypoglycemic episodes:no Polydipsia/polyuria: no Visual disturbance: no Chest pain: no Paresthesias: yes- R pinky finger Glucose Monitoring: no Taking Insulin?: no Blood Pressure Monitoring: not checking Retinal Examination: Not up to Date Foot Exam: Up to Date Diabetic Education: Completed Pneumovax: Up to Date Influenza: Not up to Date Aspirin: yes  Interim Problems from his last visit: no  Depression Screen done today and results listed below:  Depression screen [facility] 2/9 06/12/2021 12/13/2020 06/09/2020 04/27/2019 03/29/2019  Decreased Interest 0 0 0 0 0  Down, Depressed, Hopeless 0 0 0 0 0  PHQ - 2 Score 0 0 0 0 0  Altered sleeping 0 - - 0 -  Tired, decreased energy 1 - - 0 -  Change in appetite 0 - - 0 -  Feeling bad or failure about yourself  0 - - 0 -  Trouble concentrating 0 - - 0 -   Moving slowly or fidgety/restless 0 - - 0 -  Suicidal thoughts 0 - - 0 -  PHQ-9 Score 1 - - 0 -  Difficult doing work/chores - - - Not difficult at all -    Past Medical History:  Past Medical History:  Diagnosis Date   COPD (chronic obstructive pulmonary disease) ([place])    COVID-19 12/2019   Has seen pulmonology for "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   Gout    High cholesterol    Hypertension    Obesity    Sleep apnea    CPAP   Vertigo     Surgical History:  Past Surgical History:  Procedure Laterality Date   COLONOSCOPY WITH PROPOFOL N/A 10/06/2020   Procedure: COLONOSCOPY WITH PROPOFOL;  Surgeon: Lucilla Lame, MD;  Location: [place];  Service: Endoscopy;  Laterality: N/A;   ESOPHAGOGASTRODUODENOSCOPY (EGD) WITH PROPOFOL N/A 10/06/2020   Procedure: ESOPHAGOGASTRODUODENOSCOPY (EGD) WITH PROPOFOL;  Surgeon: Lucilla Lame, MD;  Location: [place];  Service: Endoscopy;  Laterality: N/A;   knee surgeries      Medications:  Current Outpatient Medications on File Prior to Visit  Medication Sig   Ascorbic Acid (VITAMIN C) 1000 MG tablet Take 1,000 mg by mouth daily.   Aspirin 81 MG CAPS Take 81 mg by mouth daily.   Cholecalciferol (VITAMIN D3) 25 MCG (1000 UT) CAPS Take 1 capsule by mouth daily.   Glucosamine-Chondroit-Biofl-Mn (GLUCOSAMINE CHONDROIT,BIOFLAV, PO) Take 1 capsule by mouth daily.   HYDROcodone-acetaminophen (NORCO/VICODIN) 5-325 MG tablet 1  po tid prn   magnesium gluconate (MAGONATE) 500 MG tablet Take 500 mg by mouth daily.   Omega-3 Fatty Acids (FISH OIL) 1200 MG CAPS Take 1 capsule by mouth daily.   ondansetron (ZOFRAN) 4 MG tablet Take 1 tablet (4 mg total) by mouth every 8 (eight) hours as needed for nausea or vomiting.   polyethylene glycol-electrolytes (GAVILYTE-N WITH FLAVOR PACK) 420 g solution Drink one 8 oz glass every 20 mins until entire container is finished starting at 5:00pm on 10/05/20   simethicone (MYLICON) 40 QP/5.9FM drops Take 0.6 mLs  (40 mg total) by mouth 4 (four) times daily as needed for flatulence.   tiZANidine (ZANAFLEX) 4 MG tablet Take 4 mg by mouth as needed.   Vitamin E 134 MG (200 UNIT) TABS Take 1 tablet by mouth daily.   Zinc 50 MG CAPS Take 1 capsule by mouth daily.   No current facility-administered medications on file prior to visit.    Allergies:  No Known Allergies  Social History:  Social History   Socioeconomic History   Marital status: Divorced    Spouse name: Not on file   Number of children: Not on file   Years of education: Not on file   Highest education level: Not on file  Occupational History   Not on file  Tobacco Use   Smoking status: Former    Packs/day: 2.00    Years: 20.00    Pack years: 40.00    Types: Cigarettes    Quit date: 10/04/1998    Years since quitting: 22.7   Smokeless tobacco: Current    Types: Snuff   Tobacco comments:    dip occassionally  Vaping Use   Vaping Use: Never used  Substance and Sexual Activity   Alcohol use: Yes    Comment: occ (weekends)   Drug use: No    Types: Marijuana   Sexual activity: Not on file  Other Topics Concern   Not on file  Social History Narrative   Not on file   Social Determinants of Health   Financial Resource Strain: Not on file  Food Insecurity: Not on file  Transportation Needs: Not on file  Physical Activity: Not on file  Stress: Not on file  Social Connections: Not on file  Intimate Partner Violence: Not on file   Social History   Tobacco Use  Smoking Status Former   Packs/day: 2.00   Years: 20.00   Pack years: 40.00   Types: Cigarettes   Quit date: 10/04/1998   Years since quitting: 22.7  Smokeless Tobacco Current   Types: Snuff  Tobacco Comments   dip occassionally   Social History   Substance and Sexual Activity  Alcohol Use Yes   Comment: occ (weekends)    Family History:  Family History  Problem Relation Age of Onset   Hyperlipidemia Father    Hypertension Father    Heart disease  Father    Heart attack Father 39    Past medical history, surgical history, medications, allergies, family history and social history reviewed with patient today and changes made to appropriate areas of the chart.   Review of Systems  Constitutional: Negative.   HENT: Negative.    Eyes: Negative.   Respiratory:  Positive for cough. Negative for hemoptysis, sputum production, shortness of breath and wheezing.   Cardiovascular: Negative.   Gastrointestinal: Negative.   Genitourinary: Negative.   Musculoskeletal:  Positive for joint pain. Negative for back pain, falls, myalgias and neck pain.  Skin: Negative.   Neurological:  Positive for tingling. Negative for dizziness, tremors, sensory change, speech change, focal weakness, seizures, loss of consciousness, weakness and headaches.  Endo/Heme/Allergies:  Positive for environmental allergies. Negative for polydipsia. Does not bruise/bleed easily.  Psychiatric/Behavioral: Negative.    All other ROS negative except what is listed above and in the HPI.      Objective:    BP 132/72    Pulse (!) 44    Temp 98.1 F (36.7 C)    Wt 211 lb 12.8 oz (96.1 kg)    SpO2 96%    BMI 32.20 kg/m   Wt Readings from Last 3 Encounters:  06/12/21 211 lb 12.8 oz (96.1 kg)  05/08/21 211 lb 9.6 oz (96 kg)  12/26/20 214 lb 2 oz (97.1 kg)    Physical Exam Vitals and nursing note reviewed.  Constitutional:      General: He is not in acute distress.    Appearance: Normal appearance. He is obese. He is not ill-appearing, toxic-appearing or diaphoretic.  HENT:     Head: Normocephalic and atraumatic.     Right Ear: Tympanic membrane, ear canal and external ear normal. There is no impacted cerumen.     Left Ear: Tympanic membrane, ear canal and external ear normal. There is no impacted cerumen.     Nose: Nose normal. No congestion or rhinorrhea.     Mouth/Throat:     Mouth: Mucous membranes are moist.     Pharynx: Oropharynx is clear. No oropharyngeal  exudate or posterior oropharyngeal erythema.  Eyes:     General: No scleral icterus.       Right eye: No discharge.        Left eye: No discharge.     Extraocular Movements: Extraocular movements intact.     Conjunctiva/sclera: Conjunctivae normal.     Pupils: Pupils are equal, round, and reactive to light.  Neck:     Vascular: No carotid bruit.  Cardiovascular:     Rate and Rhythm: Normal rate and regular rhythm.     Pulses: Normal pulses.     Heart sounds: No murmur heard.   No friction rub. No gallop.  Pulmonary:     Effort: Pulmonary effort is normal. No respiratory distress.     Breath sounds: Normal breath sounds. No stridor. No wheezing, rhonchi or rales.  Chest:     Chest wall: No tenderness.  Abdominal:     General: Abdomen is flat. Bowel sounds are normal. There is no distension.     Palpations: Abdomen is soft. There is no mass.     Tenderness: There is no abdominal tenderness. There is no right CVA tenderness, left CVA tenderness, guarding or rebound.     Hernia: No hernia is present.  Genitourinary:    Comments: Genital exam deferred with shared decision making Musculoskeletal:        General: No swelling, tenderness, deformity or signs of injury.     Cervical back: Normal range of motion and neck supple. No rigidity. No muscular tenderness.     Right lower leg: No edema.     Left lower leg: No edema.  Lymphadenopathy:     Cervical: No cervical adenopathy.  Skin:    General: Skin is warm and dry.     Capillary Refill: Capillary refill takes less than 2 seconds.     Coloration: Skin is not jaundiced or pale.     Findings: No bruising, erythema, lesion or rash.  Neurological:  General: No focal deficit present.     Mental Status: He is alert and oriented to person, place, and time.     Cranial Nerves: No cranial nerve deficit.     Sensory: No sensory deficit.     Motor: No weakness.     Coordination: Coordination normal.     Gait: Gait normal.     Deep  Tendon Reflexes: Reflexes normal.  Psychiatric:        Mood and Affect: Mood normal.        Behavior: Behavior normal.        Thought Content: Thought content normal.        Judgment: Judgment normal.    Results for orders placed or performed in visit on 12/13/20  Bayer DCA Hb A1c Waived  Result Value Ref Range   HB A1C (BAYER DCA - WAIVED) 5.8 (H) 4.8 - 5.6 %  Microalbumin, Urine Waived  Result Value Ref Range   Microalb, Ur Waived 30 (H) 0 - 19 mg/L   Creatinine, Urine Waived 200 10 - 300 mg/dL   Microalb/Creat Ratio <30 <30 mg/g  Comprehensive metabolic panel  Result Value Ref Range   Glucose 103 (H) 65 - 99 mg/dL   BUN 21 8 - 27 mg/dL   Creatinine, Ser 1.00 0.76 - 1.27 mg/dL   eGFR 85 >59 mL/min/1.73   BUN/Creatinine Ratio 21 10 - 24   Sodium 140 134 - 144 mmol/L   Potassium 4.7 3.5 - 5.2 mmol/L   Chloride 100 96 - 106 mmol/L   CO2 26 20 - 29 mmol/L   Calcium 9.7 8.6 - 10.2 mg/dL   Total Protein 7.0 6.0 - 8.5 g/dL   Albumin 4.5 3.8 - 4.8 g/dL   Globulin, Total 2.5 1.5 - 4.5 g/dL   Albumin/Globulin Ratio 1.8 1.2 - 2.2   Bilirubin Total 0.5 0.0 - 1.2 mg/dL   Alkaline Phosphatase 67 44 - 121 IU/L   AST 22 0 - 40 IU/L   ALT 26 0 - 44 IU/L  CBC with Differential/Platelet  Result Value Ref Range   WBC 7.9 3.4 - 10.8 x10E3/uL   RBC 4.33 4.14 - 5.80 x10E6/uL   Hemoglobin 13.3 13.0 - 17.7 g/dL   Hematocrit 41.2 37.5 - 51.0 %   MCV 95 79 - 97 fL   MCH 30.7 26.6 - 33.0 pg   MCHC 32.3 31.5 - 35.7 g/dL   RDW 13.7 11.6 - 15.4 %   Platelets 260 150 - 450 x10E3/uL   Neutrophils 67 Not Estab. %   Lymphs 21 Not Estab. %   Monocytes 8 Not Estab. %   Eos 3 Not Estab. %   Basos 1 Not Estab. %   Neutrophils Absolute 5.3 1.4 - 7.0 x10E3/uL   Lymphocytes Absolute 1.7 0.7 - 3.1 x10E3/uL   Monocytes Absolute 0.6 0.1 - 0.9 x10E3/uL   EOS (ABSOLUTE) 0.2 0.0 - 0.4 x10E3/uL   Basophils Absolute 0.1 0.0 - 0.2 x10E3/uL   Immature Granulocytes 0 Not Estab. %   Immature Grans (Abs) 0.0  0.0 - 0.1 x10E3/uL  Lipid Panel w/o Chol/HDL Ratio  Result Value Ref Range   Cholesterol, Total 180 100 - 199 mg/dL   Triglycerides 107 0 - 149 mg/dL   HDL 54 >39 mg/dL   VLDL Cholesterol Cal 19 5 - 40 mg/dL   LDL Chol Calc (NIH) 107 (H) 0 - 99 mg/dL  PSA  Result Value Ref Range   Prostate Specific Ag, Serum 0.4 0.0 -  4.0 ng/mL  TSH  Result Value Ref Range   TSH 1.620 0.450 - 4.500 uIU/mL  Urinalysis, Routine w reflex microscopic  Result Value Ref Range   Specific Gravity, UA 1.020 1.005 - 1.030   pH, UA 7.0 5.0 - 7.5   Color, UA Yellow Yellow   Appearance Ur Clear Clear   Leukocytes,UA Negative Negative   Protein,UA Negative Negative/Trace   Glucose, UA Negative Negative   Ketones, UA Negative Negative   RBC, UA Negative Negative   Bilirubin, UA Negative Negative   Urobilinogen, Ur 0.2 0.2 - 1.0 mg/dL   Nitrite, UA Negative Negative  Uric acid  Result Value Ref Range   Uric Acid 4.6 3.8 - 8.4 mg/dL      Assessment & Plan:   Problem List Items Addressed This Visit       Cardiovascular and Mediastinum   Hypertension    Under good control on current regimen. Continue current regimen. Continue to monitor. Call with any concerns. Refills given. Labs drawn today.       Relevant Medications   rosuvastatin (CRESTOR) 5 MG tablet (Start on 06/13/2021)   losartan (COZAAR) 100 MG tablet   icosapent Ethyl (VASCEPA) 1 g capsule   hydrochlorothiazide (HYDRODIURIL) 12.5 MG tablet   ezetimibe (ZETIA) 10 MG tablet   atorvastatin (LIPITOR) 80 MG tablet   amLODipine (NORVASC) 2.5 MG tablet   Other Relevant Orders   Comprehensive metabolic panel   CBC with Differential/Platelet   Aortic atherosclerosis (HCC)    Will keep BP, sugars and cholesterol under good control. Continue to monitor. Call with any concerns.       Relevant Medications   rosuvastatin (CRESTOR) 5 MG tablet (Start on 06/13/2021)   losartan (COZAAR) 100 MG tablet   icosapent Ethyl (VASCEPA) 1 g capsule    hydrochlorothiazide (HYDRODIURIL) 12.5 MG tablet   ezetimibe (ZETIA) 10 MG tablet   atorvastatin (LIPITOR) 80 MG tablet   amLODipine (NORVASC) 2.5 MG tablet   Other Relevant Orders   Comprehensive metabolic panel   CBC with Differential/Platelet   Lipid Panel w/o Chol/HDL Ratio     Respiratory   COPD (chronic obstructive pulmonary disease) (HCC)    Lungs clear today. Continue to follow with pulmonology. Call with any concerns.       Relevant Medications   loratadine (CLARITIN) 10 MG tablet   budesonide-formoterol (SYMBICORT) 160-4.5 MCG/ACT inhaler   albuterol (VENTOLIN HFA) 108 (90 Base) MCG/ACT inhaler   Other Relevant Orders   Comprehensive metabolic panel   CBC with Differential/Platelet   Postinflammatory pulmonary fibrosis (HCC)    Lungs clear today. Continue to follow with pulmonology. Call with any concerns.         Endocrine   Controlled type 2 diabetes mellitus with complication, without long-term current use of insulin ([place])    Doing well with A1c of 5.7. Continue current regimen. Continue to monitor. CAll with any cocnerns.       Relevant Medications   rosuvastatin (CRESTOR) 5 MG tablet (Start on 06/13/2021)   losartan (COZAAR) 100 MG tablet   atorvastatin (LIPITOR) 80 MG tablet   Other Relevant Orders   Comprehensive metabolic panel   CBC with Differential/Platelet   Bayer DCA Hb A1c Waived     Musculoskeletal and Integument   DDD (degenerative disc disease), lumbar   Relevant Medications   naproxen (NAPROSYN) 500 MG tablet   gabapentin (NEURONTIN) 300 MG capsule   colchicine 0.6 MG tablet   allopurinol (ZYLOPRIM) 300 MG tablet  Other   High cholesterol    Under good control on current regimen. Continue current regimen. Continue to monitor. Call with any concerns. Refills given. Labs drawn today.       Relevant Medications   rosuvastatin (CRESTOR) 5 MG tablet (Start on 06/13/2021)   losartan (COZAAR) 100 MG tablet   icosapent Ethyl (VASCEPA) 1 g  capsule   hydrochlorothiazide (HYDRODIURIL) 12.5 MG tablet   ezetimibe (ZETIA) 10 MG tablet   atorvastatin (LIPITOR) 80 MG tablet   amLODipine (NORVASC) 2.5 MG tablet   Other Relevant Orders   Comprehensive metabolic panel   CBC with Differential/Platelet   Lipid Panel w/o Chol/HDL Ratio   Gout    Under good control on current regimen. Continue current regimen. Continue to monitor. Call with any concerns. Refills given. Labs drawn today.       Relevant Medications   naproxen (NAPROSYN) 500 MG tablet   allopurinol (ZYLOPRIM) 300 MG tablet   Other Relevant Orders   Comprehensive metabolic panel   CBC with Differential/Platelet   Uric acid   Other Visit Diagnoses     Routine general medical examination at a health care facility    -  Primary   Vaccines up to date/declined. Screening labs checked today. Colonoscopy up to date. Continue diet and exercise. Call with any concerns.    Essential hypertension       Relevant Medications   rosuvastatin (CRESTOR) 5 MG tablet (Start on 06/13/2021)   losartan (COZAAR) 100 MG tablet   icosapent Ethyl (VASCEPA) 1 g capsule   hydrochlorothiazide (HYDRODIURIL) 12.5 MG tablet   ezetimibe (ZETIA) 10 MG tablet   atorvastatin (LIPITOR) 80 MG tablet   amLODipine (NORVASC) 2.5 MG tablet   Hyperlipidemia LDL goal <70       Relevant Medications   rosuvastatin (CRESTOR) 5 MG tablet (Start on 06/13/2021)   losartan (COZAAR) 100 MG tablet   icosapent Ethyl (VASCEPA) 1 g capsule   hydrochlorothiazide (HYDRODIURIL) 12.5 MG tablet   ezetimibe (ZETIA) 10 MG tablet   atorvastatin (LIPITOR) 80 MG tablet   amLODipine (NORVASC) 2.5 MG tablet        Discussed aspirin prophylaxis for myocardial infarction prevention and decision was made to continue ASA  LABORATORY TESTING:  Health maintenance labs ordered today as discussed above.   The natural history of prostate cancer and ongoing controversy regarding screening and potential treatment outcomes of  prostate cancer has been discussed with the patient. The meaning of a false positive PSA and a false negative PSA has been discussed. He indicates understanding of the limitations of this screening test and wishes to proceed with screening PSA testing.   IMMUNIZATIONS:   - Tdap: Tetanus vaccination status reviewed: last tetanus booster within 10 years. - Influenza: Refused - Pneumovax: Up to date - Prevnar: Not applicable - COVID: Refused - HPV: Not applicable - Shingrix vaccine: Refused  SCREENING: - Colonoscopy: Up to date  Discussed with patient purpose of the colonoscopy is to detect colon cancer at curable precancerous or early stages   PATIENT COUNSELING:    Sexuality: Discussed sexually transmitted diseases, partner selection, use of condoms, avoidance of unintended pregnancy  and contraceptive alternatives.   Advised to avoid cigarette smoking.  I discussed with the patient that most people either abstain from alcohol or drink within safe limits (<=14/week and <=4 drinks/occasion for males, <=7/weeks and <= 3 drinks/occasion for females) and that the risk for alcohol disorders and other health effects rises proportionally with the number of drinks  per week and how often a drinker exceeds daily limits.  Discussed cessation/primary prevention of drug use and availability of treatment for abuse.   Diet: Encouraged to adjust caloric intake to maintain  or achieve ideal body weight, to reduce intake of dietary saturated fat and total fat, to limit sodium intake by avoiding high sodium foods and not adding table salt, and to maintain adequate dietary potassium and calcium preferably from fresh fruits, vegetables, and low-fat dairy products.    stressed the importance of regular exercise  Injury prevention: Discussed safety belts, safety helmets, smoke detector, smoking near bedding or upholstery.   Dental health: Discussed importance of regular tooth brushing, flossing, and dental  visits.   Follow up plan: NEXT PREVENTATIVE PHYSICAL DUE IN 1 YEAR. Return in about 6 months (around 12/13/2021).

## 2021-06-12 NOTE — Assessment & Plan Note (Signed)
Lungs clear today. Continue to follow with pulmonology. Call with any concerns.  ?

## 2021-06-12 NOTE — Assessment & Plan Note (Signed)
Will keep BP, sugars and cholesterol under good control. Continue to monitor. Call with any concerns.  

## 2021-06-12 NOTE — Assessment & Plan Note (Signed)
Doing well with A1c of 5.7. Continue current regimen. Continue to monitor. CAll with any cocnerns.  ?

## 2021-06-13 LAB — CBC WITH DIFFERENTIAL/PLATELET
Basophils Absolute: 0.1 10*3/uL (ref 0.0–0.2)
Basos: 1 %
EOS (ABSOLUTE): 0.2 10*3/uL (ref 0.0–0.4)
Eos: 3 %
Hematocrit: 39.5 % (ref 37.5–51.0)
Hemoglobin: 13 g/dL (ref 13.0–17.7)
Immature Grans (Abs): 0 10*3/uL (ref 0.0–0.1)
Immature Granulocytes: 0 %
Lymphocytes Absolute: 1.6 10*3/uL (ref 0.7–3.1)
Lymphs: 18 %
MCH: 32 pg (ref 26.6–33.0)
MCHC: 32.9 g/dL (ref 31.5–35.7)
MCV: 97 fL (ref 79–97)
Monocytes Absolute: 0.7 10*3/uL (ref 0.1–0.9)
Monocytes: 8 %
Neutrophils Absolute: 6.1 10*3/uL (ref 1.4–7.0)
Neutrophils: 70 %
Platelets: 208 10*3/uL (ref 150–450)
RBC: 4.06 x10E6/uL — ABNORMAL LOW (ref 4.14–5.80)
RDW: 13.3 % (ref 11.6–15.4)
WBC: 8.7 10*3/uL (ref 3.4–10.8)

## 2021-06-13 LAB — COMPREHENSIVE METABOLIC PANEL
ALT: 20 IU/L (ref 0–44)
AST: 17 IU/L (ref 0–40)
Albumin/Globulin Ratio: 2.2 (ref 1.2–2.2)
Albumin: 4.3 g/dL (ref 3.8–4.8)
Alkaline Phosphatase: 52 IU/L (ref 44–121)
BUN/Creatinine Ratio: 20 (ref 10–24)
BUN: 20 mg/dL (ref 8–27)
Bilirubin Total: 0.2 mg/dL (ref 0.0–1.2)
CO2: 23 mmol/L (ref 20–29)
Calcium: 9.4 mg/dL (ref 8.6–10.2)
Chloride: 106 mmol/L (ref 96–106)
Creatinine, Ser: 0.98 mg/dL (ref 0.76–1.27)
Globulin, Total: 2 g/dL (ref 1.5–4.5)
Glucose: 117 mg/dL — ABNORMAL HIGH (ref 70–99)
Potassium: 4.6 mmol/L (ref 3.5–5.2)
Sodium: 145 mmol/L — ABNORMAL HIGH (ref 134–144)
Total Protein: 6.3 g/dL (ref 6.0–8.5)
eGFR: 87 mL/min/{1.73_m2} (ref >59)

## 2021-06-13 LAB — LIPID PANEL W/O CHOL/HDL RATIO
Cholesterol, Total: 163 mg/dL (ref 100–199)
HDL: 62 mg/dL (ref >39)
LDL Chol Calc (NIH): 89 mg/dL (ref 0–99)
Triglycerides: 62 mg/dL (ref 0–149)
VLDL Cholesterol Cal: 12 mg/dL (ref 5–40)

## 2021-06-13 LAB — URIC ACID: Uric Acid: 4.3 mg/dL (ref 3.8–8.4)

## 2021-07-10 ENCOUNTER — Other Ambulatory Visit: Payer: Self-pay

## 2021-07-10 MED ORDER — ICOSAPENT ETHYL 1 G PO CAPS: 2.0000 g | ORAL_CAPSULE | Freq: Two times a day (BID) | ORAL | 1 refills | Status: DC

## 2021-07-26 DIAGNOSIS — K047 Periapical abscess without sinus: Secondary | ICD-10-CM | POA: Diagnosis not present

## 2021-07-31 DIAGNOSIS — M5126 Other intervertebral disc displacement, lumbar region: Secondary | ICD-10-CM | POA: Diagnosis not present

## 2021-07-31 DIAGNOSIS — M5412 Radiculopathy, cervical region: Secondary | ICD-10-CM | POA: Diagnosis not present

## 2021-07-31 DIAGNOSIS — M5136 Other intervertebral disc degeneration, lumbar region: Secondary | ICD-10-CM | POA: Diagnosis not present

## 2021-07-31 DIAGNOSIS — M5030 Other cervical disc degeneration, unspecified cervical region: Secondary | ICD-10-CM | POA: Diagnosis not present

## 2021-08-24 ENCOUNTER — Ambulatory Visit: Payer: BC Managed Care – PPO | Admitting: Family Medicine

## 2021-08-24 ENCOUNTER — Encounter: Payer: Self-pay | Admitting: Family Medicine

## 2021-08-24 VITALS — BP 122/63 | HR 44 | Temp 97.8°F | Wt 206.8 lb

## 2021-08-24 DIAGNOSIS — G4733 Obstructive sleep apnea (adult) (pediatric): Secondary | ICD-10-CM | POA: Diagnosis not present

## 2021-08-24 DIAGNOSIS — R001 Bradycardia, unspecified: Secondary | ICD-10-CM

## 2021-08-24 DIAGNOSIS — R5382 Chronic fatigue, unspecified: Secondary | ICD-10-CM | POA: Diagnosis not present

## 2021-08-24 NOTE — Assessment & Plan Note (Signed)
This is chronic and he's seen cardiology for it. If fatigue does not improve, get him back in with cards.

## 2021-08-24 NOTE — Progress Notes (Signed)
BP 122/63   Pulse (!) 44   Temp 97.8 F (36.6 C)   Wt 206 lb 12.8 oz (93.8 kg)   SpO2 97%   BMI 31.44 kg/m    Subjective:    Patient ID: Daniel Cook, male    DOB: 1955/10/06, 66 y.o.   MRN: [id number]  HPI: Daniel Cook is a 66 y.o. male  Chief Complaint  Patient presents with   Fatigue    Patient states he is dragging and has no energy at all. Patient has been feeling like this for about a month.    FATIGUE Duration:  chronic, much worse in the last month Severity: severe  Onset: sudden Context when symptoms started:  unknown Symptoms improve with rest: no  Depressive symptoms: yes Stress/anxiety: no    08/24/2021    8:28 AM 06/12/2021    8:13 AM 12/13/2020    8:16 AM 06/09/2020    4:15 PM 04/27/2019    3:21 PM  Depression screen PHQ 2/9  Decreased Interest 2 0 0 0 0  Down, Depressed, Hopeless 2 0 0 0 0  PHQ - 2 Score 4 0 0 0 0  Altered sleeping 2 0   0  Tired, decreased energy 3 1   0  Change in appetite 0 0   0  Feeling bad or failure about yourself  0 0   0  Trouble concentrating 1 0   0  Moving slowly or fidgety/restless 0 0   0  Suicidal thoughts 0 0   0  PHQ-9 Score 10 1   0  Difficult doing work/chores     Not difficult at all   Insomnia: yes  Snoring: no Observed apnea by bed partner: uses CPAP Daytime hypersomnolence:yes Wakes feeling refreshed: no History of sleep study: yes Dysnea on exertion:  no Orthopnea/PND: no Chest pain: no Chronic cough: no Lower extremity edema: no Arthralgias:yes Myalgias: no Weakness: no Rash: no  SLEEP APNEA Sleep apnea status: controlled Duration: chronic Satisfied with current treatment?:  yes CPAP use:  yes Sleep quality with CPAP use:  good Treament compliance:excellent compliance Last sleep study: 8 years ago Treatments attempted: CPAP Wakes feeling refreshed:  no Daytime hypersomnolence:  yes Fatigue:  yes Insomnia:  yes Good sleep hygiene:  yes Difficulty falling asleep:  no Difficulty  staying asleep:  no Snoring bothers bed partner:  no Observed apnea by bed partner: no Obesity:  yes Hypertension: yes  Pulmonary hypertension:  no Coronary artery disease:  no   Relevant past medical, surgical, family and social history reviewed and updated as indicated. Interim medical history since our last visit reviewed. Allergies and medications reviewed and updated.  Review of Systems  Constitutional:  Positive for fatigue. Negative for activity change, appetite change, chills, diaphoresis, fever and unexpected weight change.  Respiratory: Negative.    Cardiovascular: Negative.   Gastrointestinal: Negative.   Musculoskeletal: Negative.   Psychiatric/Behavioral:  Positive for dysphoric mood. Negative for agitation, behavioral problems, confusion, decreased concentration, hallucinations, self-injury, sleep disturbance and suicidal ideas. The patient is not nervous/anxious and is not hyperactive.    Per HPI unless specifically indicated above     Objective:    BP 122/63   Pulse (!) 44   Temp 97.8 F (36.6 C)   Wt 206 lb 12.8 oz (93.8 kg)   SpO2 97%   BMI 31.44 kg/m   Wt Readings from Last 3 Encounters:  08/24/21 206 lb 12.8 oz (93.8 kg)  06/12/21 211 lb  12.8 oz (96.1 kg)  05/08/21 211 lb 9.6 oz (96 kg)    Physical Exam Vitals and nursing note reviewed.  Constitutional:      General: He is not in acute distress.    Appearance: Normal appearance. He is obese. He is not ill-appearing, toxic-appearing or diaphoretic.  HENT:     Head: Normocephalic and atraumatic.     Right Ear: External ear normal.     Left Ear: External ear normal.     Nose: Nose normal.     Mouth/Throat:     Mouth: Mucous membranes are moist.     Pharynx: Oropharynx is clear.  Eyes:     General: No scleral icterus.       Right eye: No discharge.        Left eye: No discharge.     Extraocular Movements: Extraocular movements intact.     Conjunctiva/sclera: Conjunctivae normal.     Pupils:  Pupils are equal, round, and reactive to light.  Cardiovascular:     Rate and Rhythm: Regular rhythm. Bradycardia present.     Pulses: Normal pulses.     Heart sounds: Normal heart sounds. No murmur heard.   No friction rub. No gallop.  Pulmonary:     Effort: Pulmonary effort is normal. No respiratory distress.     Breath sounds: Normal breath sounds. No stridor. No wheezing, rhonchi or rales.  Chest:     Chest wall: No tenderness.  Musculoskeletal:        General: Normal range of motion.     Cervical back: Normal range of motion and neck supple.  Skin:    General: Skin is warm and dry.     Capillary Refill: Capillary refill takes less than 2 seconds.     Coloration: Skin is not jaundiced or pale.     Findings: No bruising, erythema, lesion or rash.  Neurological:     General: No focal deficit present.     Mental Status: He is alert and oriented to person, place, and time. Mental status is at baseline.  Psychiatric:        Mood and Affect: Mood normal.        Behavior: Behavior normal.        Thought Content: Thought content normal.        Judgment: Judgment normal.    Results for orders placed or performed in visit on 06/12/21  Comprehensive metabolic panel  Result Value Ref Range   Glucose 117 (H) 70 - 99 mg/dL   BUN 20 8 - 27 mg/dL   Creatinine, Ser 0.98 0.76 - 1.27 mg/dL   eGFR 87 >59 mL/min/1.73   BUN/Creatinine Ratio 20 10 - 24   Sodium 145 (H) 134 - 144 mmol/L   Potassium 4.6 3.5 - 5.2 mmol/L   Chloride 106 96 - 106 mmol/L   CO2 23 20 - 29 mmol/L   Calcium 9.4 8.6 - 10.2 mg/dL   Total Protein 6.3 6.0 - 8.5 g/dL   Albumin 4.3 3.8 - 4.8 g/dL   Globulin, Total 2.0 1.5 - 4.5 g/dL   Albumin/Globulin Ratio 2.2 1.2 - 2.2   Bilirubin Total 0.2 0.0 - 1.2 mg/dL   Alkaline Phosphatase 52 44 - 121 IU/L   AST 17 0 - 40 IU/L   ALT 20 0 - 44 IU/L  CBC with Differential/Platelet  Result Value Ref Range   WBC 8.7 3.4 - 10.8 x10E3/uL   RBC 4.06 (L) 4.14 - 5.80 x10E6/uL  Hemoglobin 13.0 13.0 - 17.7 g/dL   Hematocrit 39.5 37.5 - 51.0 %   MCV 97 79 - 97 fL   MCH 32.0 26.6 - 33.0 pg   MCHC 32.9 31.5 - 35.7 g/dL   RDW 13.3 11.6 - 15.4 %   Platelets 208 150 - 450 x10E3/uL   Neutrophils 70 Not Estab. %   Lymphs 18 Not Estab. %   Monocytes 8 Not Estab. %   Eos 3 Not Estab. %   Basos 1 Not Estab. %   Neutrophils Absolute 6.1 1.4 - 7.0 x10E3/uL   Lymphocytes Absolute 1.6 0.7 - 3.1 x10E3/uL   Monocytes Absolute 0.7 0.1 - 0.9 x10E3/uL   EOS (ABSOLUTE) 0.2 0.0 - 0.4 x10E3/uL   Basophils Absolute 0.1 0.0 - 0.2 x10E3/uL   Immature Granulocytes 0 Not Estab. %   Immature Grans (Abs) 0.0 0.0 - 0.1 x10E3/uL  Bayer DCA Hb A1c Waived  Result Value Ref Range   HB A1C (BAYER DCA - WAIVED) 5.7 (H) 4.8 - 5.6 %  Lipid Panel w/o Chol/HDL Ratio  Result Value Ref Range   Cholesterol, Total 163 100 - 199 mg/dL   Triglycerides 62 0 - 149 mg/dL   HDL 62 >39 mg/dL   VLDL Cholesterol Cal 12 5 - 40 mg/dL   LDL Chol Calc (NIH) 89 0 - 99 mg/dL  Uric acid  Result Value Ref Range   Uric Acid 4.3 3.8 - 8.4 mg/dL      Assessment & Plan:   Problem List Items Addressed This Visit       Respiratory   Sleep apnea    May need repeat Sleep study if labs normal for fatigue         Other   Bradycardia    This is chronic and he's seen cardiology for it. If fatigue does not improve, get him back in with cards.        Other Visit Diagnoses     Chronic fatigue    -  Primary   Will check labs. If normal may need repeat sleep study. If normal may need to start low dose SSRI.   Relevant Orders   Comprehensive metabolic panel   CBC with Differential/Platelet   TSH   VITAMIN D 25 Hydroxy (Vit-D Deficiency, Fractures)   Lyme Disease Serology w/Reflex   Rocky mtn spotted fvr abs pnl(IgG+IgM)   Ehrlichia Antibody Panel   Babesia microti Antibody Panel   Testosterone, free, total([facility])        Follow up plan: Return in about 6 weeks (around  10/05/2021).

## 2021-08-24 NOTE — Assessment & Plan Note (Signed)
May need repeat Sleep study if labs normal for fatigue

## 2021-08-25 LAB — CBC WITH DIFFERENTIAL/PLATELET
Basophils Absolute: 0.1 10*3/uL (ref 0.0–0.2)
Basos: 1 %
EOS (ABSOLUTE): 0.3 10*3/uL (ref 0.0–0.4)
Eos: 4 %
Hematocrit: 39.5 % (ref 37.5–51.0)
Hemoglobin: 13 g/dL (ref 13.0–17.7)
Immature Grans (Abs): 0 10*3/uL (ref 0.0–0.1)
Immature Granulocytes: 0 %
Lymphocytes Absolute: 1.3 10*3/uL (ref 0.7–3.1)
Lymphs: 18 %
MCH: 31.8 pg (ref 26.6–33.0)
MCHC: 32.9 g/dL (ref 31.5–35.7)
MCV: 97 fL (ref 79–97)
Monocytes Absolute: 0.5 10*3/uL (ref 0.1–0.9)
Monocytes: 7 %
Neutrophils Absolute: 5.1 10*3/uL (ref 1.4–7.0)
Neutrophils: 70 %
Platelets: 193 10*3/uL (ref 150–450)
RBC: 4.09 x10E6/uL — ABNORMAL LOW (ref 4.14–5.80)
RDW: 12.7 % (ref 11.6–15.4)
WBC: 7.3 10*3/uL (ref 3.4–10.8)

## 2021-08-29 LAB — COMPREHENSIVE METABOLIC PANEL
ALT: 28 IU/L (ref 0–44)
AST: 23 IU/L (ref 0–40)
Albumin/Globulin Ratio: 2 (ref 1.2–2.2)
Albumin: 4.5 g/dL (ref 3.8–4.8)
Alkaline Phosphatase: 53 IU/L (ref 44–121)
BUN/Creatinine Ratio: 23 (ref 10–24)
BUN: 19 mg/dL (ref 8–27)
Bilirubin Total: 0.3 mg/dL (ref 0.0–1.2)
CO2: 24 mmol/L (ref 20–29)
Calcium: 9.3 mg/dL (ref 8.6–10.2)
Chloride: 104 mmol/L (ref 96–106)
Creatinine, Ser: 0.84 mg/dL (ref 0.76–1.27)
Globulin, Total: 2.3 g/dL (ref 1.5–4.5)
Glucose: 113 mg/dL — ABNORMAL HIGH (ref 70–99)
Potassium: 4.6 mmol/L (ref 3.5–5.2)
Sodium: 141 mmol/L (ref 134–144)
Total Protein: 6.8 g/dL (ref 6.0–8.5)
eGFR: 97 mL/min/{1.73_m2} (ref >59)

## 2021-08-29 LAB — ROCKY MTN SPOTTED FVR ABS PNL(IGG+IGM)
RMSF IgG: POSITIVE — AB
RMSF IgM: 0.84 index (ref 0.00–0.89)

## 2021-08-29 LAB — EHRLICHIA ANTIBODY PANEL
E. Chaffeensis (HME) IgM Titer: NEGATIVE
E.Chaffeensis (HME) IgG: NEGATIVE
HGE IgG Titer: NEGATIVE
HGE IgM Titer: NEGATIVE

## 2021-08-29 LAB — LYME DISEASE SEROLOGY W/REFLEX: Lyme Total Antibody EIA: NEGATIVE

## 2021-08-29 LAB — TSH: TSH: 1.34 u[IU]/mL (ref 0.450–4.500)

## 2021-08-29 LAB — BABESIA MICROTI ANTIBODY PANEL
Babesia microti IgG: <1:10 {titer}
Babesia microti IgM: <1:10 {titer}

## 2021-08-29 LAB — TESTOSTERONE, FREE, TOTAL, SHBG
Sex Hormone Binding: 43.6 nmol/L (ref 19.3–76.4)
Testosterone, Free: 5.1 pg/mL — ABNORMAL LOW (ref 6.6–18.1)
Testosterone: 354 ng/dL (ref 264–916)

## 2021-08-29 LAB — RMSF, IGG, IFA: RMSF, IGG, IFA: <1:64 {titer}

## 2021-08-29 LAB — VITAMIN D 25 HYDROXY (VIT D DEFICIENCY, FRACTURES): Vit D, 25-Hydroxy: 38.8 ng/mL (ref 30.0–100.0)

## 2021-09-24 ENCOUNTER — Telehealth: Payer: Self-pay

## 2021-09-24 NOTE — Telephone Encounter (Signed)
Called patient to clarify if he is taking both medications, patient states he currently is. Notified pharmacy per Dr. Wynetta Emery that is correct.

## 2021-09-24 NOTE — Telephone Encounter (Signed)
Pharmacy called to inquire whether patient should be taking Atorvastatin '80MG'$  and Rosuvastatin 5 MG?  Please call pharmacy (okay to speak with any pharmacist) to advise.

## 2021-10-05 ENCOUNTER — Encounter: Payer: Self-pay | Admitting: Family Medicine

## 2021-10-05 ENCOUNTER — Ambulatory Visit: Payer: BC Managed Care – PPO | Admitting: Family Medicine

## 2021-10-05 VITALS — BP 132/62 | HR 41 | Temp 98.6°F | Ht 67.99 in | Wt 204.6 lb

## 2021-10-05 DIAGNOSIS — G4733 Obstructive sleep apnea (adult) (pediatric): Secondary | ICD-10-CM | POA: Diagnosis not present

## 2021-10-05 DIAGNOSIS — R5382 Chronic fatigue, unspecified: Secondary | ICD-10-CM | POA: Diagnosis not present

## 2021-10-05 DIAGNOSIS — E118 Type 2 diabetes mellitus with unspecified complications: Secondary | ICD-10-CM | POA: Diagnosis not present

## 2021-10-05 DIAGNOSIS — E7800 Pure hypercholesterolemia, unspecified: Secondary | ICD-10-CM

## 2021-10-05 DIAGNOSIS — R519 Headache, unspecified: Secondary | ICD-10-CM | POA: Diagnosis not present

## 2021-10-05 LAB — BAYER DCA HB A1C WAIVED: HB A1C (BAYER DCA - WAIVED): 5.7 % — ABNORMAL HIGH (ref 4.8–5.6)

## 2021-10-05 NOTE — Assessment & Plan Note (Signed)
Will hold his crestor for 2 weeks then recheck to see if he feels better. Check back in 2 weeks.

## 2021-10-05 NOTE — Assessment & Plan Note (Signed)
Concern for uncontrolled OSA. Will repeat PSG, if no better, consider re-eval with cardiology.

## 2021-10-05 NOTE — Assessment & Plan Note (Signed)
Doing great with A1c of 5.7. Continue current regimen. Call with any concerns.

## 2021-10-05 NOTE — Progress Notes (Signed)
BP 132/62   Pulse (!) 41   Temp 98.6 F (37 C) (Oral)   Ht 5' 7.99" (1.727 m)   Wt 204 lb 9.6 oz (92.8 kg)   SpO2 96%   BMI 31.12 kg/m    Subjective:    Patient ID: Daniel Cook, male    DOB: 16-Dec-1955, 66 y.o.   MRN: [id number]  HPI: Daniel Cook is a 66 y.o. male  Chief Complaint  Patient presents with   Diabetes   Hypertension   Fatigue   HYPERTENSION Hypertension status: controlled  Satisfied with current treatment? yes Duration of hypertension: chronic BP monitoring frequency:  rarely BP range:  BP medication side effects:  no Medication compliance: excellent compliance Previous BP meds:amlodipine, losartan, HCTZ Aspirin: yes Recurrent headaches: no Visual changes: no Palpitations: no Dyspnea: no Chest pain: no Lower extremity edema: no Dizzy/lightheaded: no  FATIGUE Duration:  chronic Severity: moderate  Onset: gradual Context when symptoms started:  unknown Symptoms improve with rest: no  Depressive symptoms: no Stress/anxiety: no Insomnia: no  Snoring: yes Observed apnea by bed partner: yes Daytime hypersomnolence:yes Wakes feeling refreshed: no History of sleep study: yes Dysnea on exertion:  no Orthopnea/PND: no Chest pain: no Chronic cough: no Lower extremity edema: no Arthralgias:no Myalgias: no Weakness: no Rash: no  SLEEP APNEA Sleep apnea status: unsure Duration: chronic Satisfied with current treatment?:  unsure CPAP use:  yes Sleep quality with CPAP use:  good Treament compliance:good compliance Last sleep study: 2015 Treatments attempted: CPAP  Wakes feeling refreshed:  no Daytime hypersomnolence:  no Fatigue:  yes Insomnia:  no Good sleep hygiene:  yes Difficulty falling asleep:  no Difficulty staying asleep:  no Snoring bothers bed partner:  no Observed apnea by bed partner: no Obesity:  yes Hypertension: yes  Pulmonary hypertension:  no Coronary artery disease:  yes  DIABETES Hypoglycemic  episodes:no Polydipsia/polyuria: no Visual disturbance: no Chest pain: no Paresthesias: no Glucose Monitoring: no  Accucheck frequency: Not Checking Taking Insulin?: no Blood Pressure Monitoring: not checking Retinal Examination: Up to Date Foot Exam: Up to Date Diabetic Education: Completed Pneumovax: Up to Date Influenza: Up to Date Aspirin: yes  Relevant past medical, surgical, family and social history reviewed and updated as indicated. Interim medical history since our last visit reviewed. Allergies and medications reviewed and updated.  Review of Systems  Constitutional:  Positive for fatigue. Negative for activity change, appetite change, chills, diaphoresis, fever and unexpected weight change.  Respiratory: Negative.    Cardiovascular: Negative.   Gastrointestinal: Negative.   Musculoskeletal: Negative.   Psychiatric/Behavioral: Negative.      Per HPI unless specifically indicated above     Objective:    BP 132/62   Pulse (!) 41   Temp 98.6 F (37 C) (Oral)   Ht 5' 7.99" (1.727 m)   Wt 204 lb 9.6 oz (92.8 kg)   SpO2 96%   BMI 31.12 kg/m   Wt Readings from Last 3 Encounters:  10/05/21 204 lb 9.6 oz (92.8 kg)  08/24/21 206 lb 12.8 oz (93.8 kg)  06/12/21 211 lb 12.8 oz (96.1 kg)    Physical Exam Vitals and nursing note reviewed.  Constitutional:      General: He is not in acute distress.    Appearance: Normal appearance. He is obese. He is not ill-appearing, toxic-appearing or diaphoretic.  HENT:     Head: Normocephalic and atraumatic.     Right Ear: External ear normal.     Left Ear: External  ear normal.     Nose: Nose normal.     Mouth/Throat:     Mouth: Mucous membranes are moist.     Pharynx: Oropharynx is clear.  Eyes:     General: No scleral icterus.       Right eye: No discharge.        Left eye: No discharge.     Extraocular Movements: Extraocular movements intact.     Conjunctiva/sclera: Conjunctivae normal.     Pupils: Pupils are equal,  round, and reactive to light.  Cardiovascular:     Rate and Rhythm: Regular rhythm. Bradycardia present.     Pulses: Normal pulses.     Heart sounds: Normal heart sounds. No murmur heard.    No friction rub. No gallop.  Pulmonary:     Effort: Pulmonary effort is normal. No respiratory distress.     Breath sounds: Normal breath sounds. No stridor. No wheezing, rhonchi or rales.  Chest:     Chest wall: No tenderness.  Musculoskeletal:        General: Normal range of motion.     Cervical back: Normal range of motion and neck supple.  Skin:    General: Skin is warm and dry.     Capillary Refill: Capillary refill takes less than 2 seconds.     Coloration: Skin is not jaundiced or pale.     Findings: No bruising, erythema, lesion or rash.  Neurological:     General: No focal deficit present.     Mental Status: He is alert and oriented to person, place, and time. Mental status is at baseline.  Psychiatric:        Mood and Affect: Mood normal.        Behavior: Behavior normal.        Thought Content: Thought content normal.        Judgment: Judgment normal.     Results for orders placed or performed in visit on 10/05/21  Bayer DCA Hb A1c Waived  Result Value Ref Range   HB A1C (BAYER DCA - WAIVED) 5.7 (H) 4.8 - 5.6 %      Assessment & Plan:   Problem List Items Addressed This Visit       Respiratory   Sleep apnea    Concern for uncontrolled OSA. Will repeat PSG, if no better, consider re-eval with cardiology.      Relevant Orders   Ambulatory referral to Sleep Studies     Endocrine   Controlled type 2 diabetes mellitus with complication, without long-term current use of insulin ([place]) - Primary    Doing great with A1c of 5.7. Continue current regimen. Call with any concerns.       Relevant Orders   Bayer DCA Hb A1c Waived (Completed)     Other   High cholesterol    Will hold his crestor for 2 weeks then recheck to see if he feels better. Check back in 2 weeks.        Other Visit Diagnoses     Morning headache       Concern for uncontrolled OSA. Will repeat PSG, if no better, consider re-eval with cardiology.   Relevant Orders   Ambulatory referral to Sleep Studies   Chronic fatigue       Concern for uncontrolled OSA. Will repeat PSG, if no better, consider re-eval with cardiology given chronic bradycardia. Consider SSRI if still no better.         Follow up plan: Return in  about 6 months (around 04/06/2022).

## 2021-10-21 ENCOUNTER — Encounter: Payer: Self-pay | Admitting: Family Medicine

## 2021-10-22 ENCOUNTER — Other Ambulatory Visit: Payer: Self-pay | Admitting: Family Medicine

## 2021-10-22 DIAGNOSIS — E7800 Pure hypercholesterolemia, unspecified: Secondary | ICD-10-CM

## 2021-10-30 DIAGNOSIS — M5030 Other cervical disc degeneration, unspecified cervical region: Secondary | ICD-10-CM | POA: Diagnosis not present

## 2021-10-30 DIAGNOSIS — M5416 Radiculopathy, lumbar region: Secondary | ICD-10-CM | POA: Diagnosis not present

## 2021-10-30 DIAGNOSIS — M5412 Radiculopathy, cervical region: Secondary | ICD-10-CM | POA: Diagnosis not present

## 2021-12-06 ENCOUNTER — Ambulatory Visit (INDEPENDENT_AMBULATORY_CARE_PROVIDER_SITE_OTHER): Payer: BC Managed Care – PPO | Admitting: Family Medicine

## 2021-12-06 ENCOUNTER — Encounter: Payer: Self-pay | Admitting: Family Medicine

## 2021-12-06 VITALS — BP 129/65 | HR 45 | Temp 98.7°F | Wt 201.9 lb

## 2021-12-06 DIAGNOSIS — J441 Chronic obstructive pulmonary disease with (acute) exacerbation: Secondary | ICD-10-CM

## 2021-12-06 MED ORDER — ALBUTEROL SULFATE HFA 108 (90 BASE) MCG/ACT IN AERS
INHALATION_SPRAY | RESPIRATORY_TRACT | 2 refills | Status: DC
Start: 2021-12-06 — End: 2022-04-09

## 2021-12-06 MED ORDER — PREDNISONE 50 MG PO TABS: 50.0000 mg | ORAL_TABLET | Freq: Every day | ORAL | 0 refills | Status: DC

## 2021-12-06 MED ORDER — AZITHROMYCIN 250 MG PO TABS: ORAL_TABLET | ORAL | 0 refills | Status: AC

## 2021-12-06 MED ORDER — BUDESONIDE-FORMOTEROL FUMARATE 160-4.5 MCG/ACT IN AERO
2.0000 | INHALATION_SPRAY | Freq: Two times a day (BID) | RESPIRATORY_TRACT | 3 refills | Status: DC
Start: 2021-12-06 — End: 2022-10-08

## 2021-12-06 NOTE — Progress Notes (Signed)
BP 129/65   Pulse (!) 45   Temp 98.7 F (37.1 C)   Wt 201 lb 14.4 oz (91.6 kg)   SpO2 95%   BMI 30.71 kg/m    Subjective:    Patient ID: Daniel Cook, male    DOB: November 17, 1955, 66 y.o.   MRN: [id number]  HPI: Daniel Cook is a 66 y.o. male  Chief Complaint  Patient presents with   Cough    Patient states he has been coughing up yellow mucus for a week. Has tried DayQuill for his cough, but did not help.    UPPER RESPIRATORY TRACT INFECTION Duration: about a week Worst symptom: cough Fever: no Cough: yes Shortness of breath: yes Wheezing: yes Chest pain: no Chest tightness: yes Chest congestion: yes Nasal congestion: yes Runny nose: yes Post nasal drip: no Sneezing: no Sore throat: no Swollen glands: no Sinus pressure: yes Headache: yes Face pain: no Toothache: no Ear pain: no  Ear pressure: no  Eyes red/itching:yes Eye drainage/crusting: no  Vomiting: no Rash: no Fatigue: yes Sick contacts: no Strep contacts: no  Context: worse Recurrent sinusitis: no Relief with OTC cold/cough medications: no  Treatments attempted: ibuprofen    Relevant past medical, surgical, family and social history reviewed and updated as indicated. Interim medical history since our last visit reviewed. Allergies and medications reviewed and updated.  Review of Systems  Constitutional: Negative.   HENT:  Positive for congestion, postnasal drip, rhinorrhea and sinus pressure. Negative for dental problem, drooling, ear discharge, ear pain, facial swelling, hearing loss, mouth sores, nosebleeds, sinus pain, sneezing, sore throat, tinnitus, trouble swallowing and voice change.   Respiratory:  Positive for chest tightness, shortness of breath and wheezing. Negative for apnea, cough, choking and stridor.   Cardiovascular: Negative.   Gastrointestinal: Negative.   Skin: Negative.   Psychiatric/Behavioral: Negative.      Per HPI unless specifically indicated above      Objective:    BP 129/65   Pulse (!) 45   Temp 98.7 F (37.1 C)   Wt 201 lb 14.4 oz (91.6 kg)   SpO2 95%   BMI 30.71 kg/m   Wt Readings from Last 3 Encounters:  12/06/21 201 lb 14.4 oz (91.6 kg)  10/05/21 204 lb 9.6 oz (92.8 kg)  08/24/21 206 lb 12.8 oz (93.8 kg)    Physical Exam Vitals and nursing note reviewed.  Constitutional:      General: He is not in acute distress.    Appearance: Normal appearance. He is not ill-appearing, toxic-appearing or diaphoretic.  HENT:     Head: Normocephalic and atraumatic.     Right Ear: External ear normal.     Left Ear: External ear normal.     Nose: Nose normal.     Mouth/Throat:     Mouth: Mucous membranes are moist.     Pharynx: Oropharynx is clear.  Eyes:     General: No scleral icterus.       Right eye: No discharge.        Left eye: No discharge.     Extraocular Movements: Extraocular movements intact.     Conjunctiva/sclera: Conjunctivae normal.     Pupils: Pupils are equal, round, and reactive to light.  Cardiovascular:     Rate and Rhythm: Normal rate and regular rhythm.     Pulses: Normal pulses.     Heart sounds: Normal heart sounds. No murmur heard.    No friction rub. No gallop.  Pulmonary:  Effort: Pulmonary effort is normal. No respiratory distress.     Breath sounds: No stridor. Examination of the right-upper field reveals decreased breath sounds. Examination of the left-upper field reveals decreased breath sounds. Examination of the right-middle field reveals decreased breath sounds. Examination of the left-middle field reveals decreased breath sounds. Examination of the right-lower field reveals decreased breath sounds. Examination of the left-lower field reveals decreased breath sounds. Decreased breath sounds present. No wheezing, rhonchi or rales.  Chest:     Chest wall: No tenderness.  Musculoskeletal:        General: Normal range of motion.     Cervical back: Normal range of motion and neck supple.   Skin:    General: Skin is warm and dry.     Capillary Refill: Capillary refill takes less than 2 seconds.     Coloration: Skin is not jaundiced or pale.     Findings: No bruising, erythema, lesion or rash.  Neurological:     General: No focal deficit present.     Mental Status: He is alert and oriented to person, place, and time. Mental status is at baseline.  Psychiatric:        Mood and Affect: Mood normal.        Behavior: Behavior normal.        Thought Content: Thought content normal.        Judgment: Judgment normal.     Results for orders placed or performed in visit on 10/05/21  Bayer DCA Hb A1c Waived  Result Value Ref Range   HB A1C (BAYER DCA - WAIVED) 5.7 (H) 4.8 - 5.6 %      Assessment & Plan:   Problem List Items Addressed This Visit   None Visit Diagnoses     COPD exacerbation ([place])    -  Primary   Will treat with prednisone. If not better by Saturday will start z-pack. Continue to monitor. Call with any concerns.    Relevant Medications   predniSONE (DELTASONE) 50 MG tablet   azithromycin (ZITHROMAX) 250 MG tablet   budesonide-formoterol (SYMBICORT) 160-4.5 MCG/ACT inhaler   albuterol (VENTOLIN HFA) 108 (90 Base) MCG/ACT inhaler        Follow up plan: Return As scheduled.

## 2021-12-14 ENCOUNTER — Ambulatory Visit: Payer: BC Managed Care – PPO | Admitting: Family Medicine

## 2021-12-14 ENCOUNTER — Telehealth: Payer: BC Managed Care – PPO | Admitting: Family Medicine

## 2021-12-15 ENCOUNTER — Other Ambulatory Visit: Payer: Self-pay | Admitting: Family Medicine

## 2021-12-18 ENCOUNTER — Other Ambulatory Visit: Payer: Self-pay | Admitting: Family Medicine

## 2021-12-18 ENCOUNTER — Ambulatory Visit: Payer: BC Managed Care – PPO | Admitting: Family Medicine

## 2021-12-18 DIAGNOSIS — E785 Hyperlipidemia, unspecified: Secondary | ICD-10-CM

## 2021-12-18 NOTE — Telephone Encounter (Signed)
Requested Prescriptions  Pending Prescriptions Disp Refills  . amLODipine (NORVASC) 2.5 MG tablet [Pharmacy Med Name: AMLODIPINE BESYLATE 2.5 MG TAB] 90 tablet 1    Sig: TAKE 1 TABLET BY MOUTH DAILY     Cardiovascular: Calcium Channel Blockers 2 Passed - 12/15/2021 11:32 AM      Passed - Last BP in normal range    BP Readings from Last 1 Encounters:  12/06/21 129/65         Passed - Last Heart Rate in normal range    Pulse Readings from Last 1 Encounters:  12/06/21 (!) 45         Passed - Valid encounter within last 6 months    Recent Outpatient Visits          1 week ago COPD exacerbation ([place])   [place], Megan P, DO   2 months ago Controlled type 2 diabetes mellitus with complication, without long-term current use of insulin ([place])   [place], [place], DO   3 months ago Chronic fatigue   [place], [place], DO   6 months ago Routine general medical examination at a health care facility   [place], [place], DO   1 year ago Routine general medical examination at a health care facility   [place], Barb Merino, DO      Future Appointments            In 3 months Wynetta Emery, Barb Merino, DO [facility], [place]

## 2021-12-20 NOTE — Telephone Encounter (Signed)
Requested Prescriptions  Pending Prescriptions Disp Refills  . atorvastatin (LIPITOR) 80 MG tablet [Pharmacy Med Name: ATORVASTATIN CALCIUM 80 MG TAB] 90 tablet 1    Sig: Take 1 tablet (80 mg total) by mouth daily. To be taken with crestor for better control per cardiology     Cardiovascular:  Antilipid - Statins Failed - 12/18/2021 10:53 PM      Failed - Lipid Panel in normal range within the last 12 months    Cholesterol, Total  Date Value Ref Range Status  06/12/2021 163 100 - 199 mg/dL Final   Cholesterol [place], Waived  Date Value Ref Range Status  09/22/2018 212 (H) <200 mg/dL Final    Comment:                            Desirable                <200                         Borderline High      200- 239                         High                     >239    LDL Chol Calc (NIH)  Date Value Ref Range Status  06/12/2021 89 0 - 99 mg/dL Final   HDL  Date Value Ref Range Status  06/12/2021 62 >39 mg/dL Final   Triglycerides  Date Value Ref Range Status  06/12/2021 62 0 - 149 mg/dL Final   Triglycerides Piccolo,Waived  Date Value Ref Range Status  09/22/2018 192 (H) <150 mg/dL Final    Comment:                            Normal                   <150                         Borderline High     150 - 199                         High                200 - 499                         Very High                >499          Passed - Patient is not pregnant      Passed - Valid encounter within last 12 months    Recent Outpatient Visits          2 weeks ago COPD exacerbation ([place])   [place], Megan P, DO   2 months ago Controlled type 2 diabetes mellitus with complication, without long-term current use of insulin ([place])   [place], Megan P, DO   3 months ago Chronic fatigue   [facility] [place], [place], DO   6 months ago Routine general medical examination at a health care facility  [place] P, DO   1 year ago Routine general medical examination at a health care facility   [place], Barb Merino, DO      Future Appointments            In 3 months Wynetta Emery, Barb Merino, DO [facility], [place]           . omeprazole (PRILOSEC) 20 MG capsule [Pharmacy Med Name: OMEPRAZOLE DR 20 MG CAP] 90 capsule 1    Sig: TAKE 1 CAPSULE BY MOUTH ONCE DAILY     Gastroenterology: Proton Pump Inhibitors Passed - 12/18/2021 10:53 PM      Passed - Valid encounter within last 12 months    Recent Outpatient Visits          2 weeks ago COPD exacerbation ([place])   [place], Megan P, DO   2 months ago Controlled type 2 diabetes mellitus with complication, without long-term current use of insulin ([place])   [place], [place], DO   3 months ago Chronic fatigue   [place], [place], DO   6 months ago Routine general medical examination at a health care facility   [place], [place], DO   1 year ago Routine general medical examination at a health care facility   [place], Barb Merino, DO      Future Appointments            In 3 months Wynetta Emery, Barb Merino, DO [facility] [place], [place]

## 2021-12-24 ENCOUNTER — Other Ambulatory Visit: Payer: Self-pay | Admitting: *Deleted

## 2021-12-25 MED ORDER — ICOSAPENT ETHYL 1 G PO CAPS: 2.0000 g | ORAL_CAPSULE | Freq: Two times a day (BID) | ORAL | 0 refills | Status: DC

## 2021-12-25 NOTE — Telephone Encounter (Signed)
Scheduled

## 2022-01-03 ENCOUNTER — Other Ambulatory Visit: Payer: Self-pay | Admitting: Family Medicine

## 2022-01-03 DIAGNOSIS — E7800 Pure hypercholesterolemia, unspecified: Secondary | ICD-10-CM

## 2022-01-03 NOTE — Telephone Encounter (Signed)
Requested Prescriptions  Pending Prescriptions Disp Refills  . ezetimibe (ZETIA) 10 MG tablet [Pharmacy Med Name: EZETIMIBE 10 MG TAB] 90 tablet 0    Sig: TAKE 1 TABLET BY MOUTH DAILY     Cardiovascular:  Antilipid - Sterol Transport Inhibitors Failed - 01/03/2022  5:25 AM      Failed - Lipid Panel in normal range within the last 12 months    Cholesterol, Total  Date Value Ref Range Status  06/12/2021 163 100 - 199 mg/dL Final   Cholesterol Piccolo, Waived  Date Value Ref Range Status  09/22/2018 212 (H) <200 mg/dL Final    Comment:                            Desirable                <200                         Borderline High      200- 239                         High                     >239    LDL Chol Calc (NIH)  Date Value Ref Range Status  06/12/2021 89 0 - 99 mg/dL Final   HDL  Date Value Ref Range Status  06/12/2021 62 >39 mg/dL Final   Triglycerides  Date Value Ref Range Status  06/12/2021 62 0 - 149 mg/dL Final   Triglycerides Piccolo,Waived  Date Value Ref Range Status  09/22/2018 192 (H) <150 mg/dL Final    Comment:                            Normal                   <150                         Borderline High     150 - 199                         High                200 - 499                         Very High                >499          Passed - AST in normal range and within 360 days    AST  Date Value Ref Range Status  08/24/2021 23 0 - 40 IU/L Final   AST (SGOT) Piccolo, Waived  Date Value Ref Range Status  09/22/2018 17 11 - 38 U/L Final         Passed - ALT in normal range and within 360 days    ALT  Date Value Ref Range Status  08/24/2021 28 0 - 44 IU/L Final   ALT (SGPT) Piccolo, Waived  Date Value Ref Range Status  09/22/2018 22 10 - 47 U/L Final         Passed - Patient is not pregnant  Passed - Valid encounter within last 12 months    Recent Outpatient Visits          4 weeks ago COPD exacerbation ([place])   [place], Megan P, DO   3 months ago Controlled type 2 diabetes mellitus with complication, without long-term current use of insulin ([place])   [place], [place], DO   4 months ago Chronic fatigue   [facility] [place], [place], DO   6 months ago Routine general medical examination at a health care facility   [place], [place], DO   1 year ago Routine general medical examination at a health care facility   [facility], Barb Merino, DO      Future Appointments            In 5 days Gollan, Kathlene November, MD [place]. [place]   In 3 months Johnson, Barb Merino, DO [employer], [facility]

## 2022-01-07 DIAGNOSIS — M5412 Radiculopathy, cervical region: Secondary | ICD-10-CM | POA: Diagnosis not present

## 2022-01-07 DIAGNOSIS — M5416 Radiculopathy, lumbar region: Secondary | ICD-10-CM | POA: Diagnosis not present

## 2022-01-07 DIAGNOSIS — M5030 Other cervical disc degeneration, unspecified cervical region: Secondary | ICD-10-CM | POA: Diagnosis not present

## 2022-01-07 DIAGNOSIS — M5126 Other intervertebral disc displacement, lumbar region: Secondary | ICD-10-CM | POA: Diagnosis not present

## 2022-01-07 DIAGNOSIS — Z79899 Other long term (current) drug therapy: Secondary | ICD-10-CM | POA: Diagnosis not present

## 2022-01-07 NOTE — Progress Notes (Signed)
Cardiology Office Note  Date:  01/08/2022   ID:  Daniel, Cook 25-Dec-1955, MRN [id number]  PCP:  Valerie Roys, DO   Chief Complaint  Patient presents with   12 month follow up     Patient c/o shortness of breath. Medications reviewed by the patient verbally.     HPI:  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 Quentin Ore Aug 16, 2020 heart rate 42  Stress test was previously ordered for atypical chest pain Did not go through with a stress test, was too expensive Could not afford cardiac CTA  Had COVID February 2022, severe symptoms,   ZIO previously performed showing brief episodes of nonsustained atrial tachycardia  EKG personally reviewed by myself on todays visit Sinus bradycardia rate 46 bpm  Other past medical history reviewed Echo March 2022 Normal LV function, normal RV function, no significant valve disease   PMH:   has a past medical history of COPD (chronic obstructive pulmonary disease) ([place]), COVID-19 (12/2019), Gout, High cholesterol, Hypertension, Obesity, Sleep apnea, and Vertigo.  PSH:    Past Surgical History:   Procedure Laterality Date   COLONOSCOPY WITH PROPOFOL N/A 10/06/2020   Procedure: COLONOSCOPY WITH PROPOFOL;  Surgeon: Lucilla Lame, MD;  Location: [place];  Service: Endoscopy;  Laterality: N/A;   ESOPHAGOGASTRODUODENOSCOPY (EGD) WITH PROPOFOL N/A 10/06/2020   Procedure: ESOPHAGOGASTRODUODENOSCOPY (EGD) WITH PROPOFOL;  Surgeon: Lucilla Lame, MD;  Location: [place];  Service: Endoscopy;  Laterality: N/A;   knee surgeries      Current Outpatient Medications  Medication Sig Dispense Refill   albuterol (VENTOLIN HFA) 108 (90 Base) MCG/ACT inhaler INHALE 1-2 PUFFS INTO THE LUNGS EVERY 6 HOURS AS NEEDED FOR WHEEZING/ SHORTNESS OF BREATH 8.5 g 2   allopurinol (ZYLOPRIM) 300 MG tablet Take 1 tablet (300 mg total) by mouth daily. 90 tablet 1   amLODipine (NORVASC) 2.5 MG tablet TAKE 1 TABLET BY MOUTH DAILY 90 tablet 1   Ascorbic Acid (VITAMIN C) 1000 MG tablet Take 1,000 mg by mouth daily.     Aspirin 81 MG CAPS Take 81 mg by mouth daily.     atorvastatin (LIPITOR) 80 MG tablet Take 1 tablet (80 mg total) by mouth daily. To be taken with crestor for better control per cardiology 90 tablet 1   budesonide-formoterol (SYMBICORT) 160-4.5 MCG/ACT inhaler Inhale 2 puffs into the lungs 2 (two) times daily. 3 each 3   Cholecalciferol (VITAMIN D3) 25 MCG (1000 UT) CAPS Take 1 capsule by mouth daily.     colchicine 0.6 MG tablet Take 1 tablet (0.6 mg total) by mouth daily as needed.  90 tablet 1   ezetimibe (ZETIA) 10 MG tablet TAKE 1 TABLET BY MOUTH DAILY 90 tablet 0   gabapentin (NEURONTIN) 300 MG capsule Take 2 capsules (600 mg total) by mouth 3 (three) times daily. 540 capsule 1   Glucosamine-Chondroit-Biofl-Mn (GLUCOSAMINE CHONDROIT,BIOFLAV, PO) Take 1 capsule by mouth daily.     hydrochlorothiazide (HYDRODIURIL) 12.5 MG tablet TAKE ONE TABLET (12.5 MG) BY MOUTH EVERYDAY 90 tablet 1   HYDROcodone-acetaminophen (NORCO/VICODIN) 5-325 MG tablet 1 po tid prn 15 tablet 0   icosapent Ethyl  (VASCEPA) 1 g capsule Take 2 capsules (2 g total) by mouth 2 (two) times daily. 360 capsule 0   loratadine (CLARITIN) 10 MG tablet TAKE ONE TABLET (10 MG) BY MOUTH EVERY DAY 90 tablet 3   losartan (COZAAR) 100 MG tablet TAKE ONE TABLET (100 MG) BY MOUTH EVERY DAY 90 tablet 1   magnesium gluconate (MAGONATE) 500 MG tablet Take 500 mg by mouth daily.     naproxen (NAPROSYN) 500 MG tablet TAKE ONE TABLET TWICE DAILY WITH A MEAL 180 tablet 1   Omega-3 Fatty Acids (FISH OIL) 1200 MG CAPS Take 1 capsule by mouth daily.     omeprazole (PRILOSEC) 20 MG capsule TAKE 1 CAPSULE BY MOUTH ONCE DAILY 90 capsule 1   ondansetron (ZOFRAN) 4 MG tablet Take 1 tablet (4 mg total) by mouth every 8 (eight) hours as needed for nausea or vomiting. 30 tablet 0   simethicone (MYLICON) 40 RK/2.7CW drops Take 0.6 mLs (40 mg total) by mouth 4 (four) times daily as needed for flatulence. 120 mL 1   tiZANidine (ZANAFLEX) 4 MG tablet Take 4 mg by mouth as needed.     Vitamin E 134 MG (200 UNIT) TABS Take 1 tablet by mouth daily.     Zinc 50 MG CAPS Take 1 capsule by mouth daily.     predniSONE (DELTASONE) 50 MG tablet Take 1 tablet (50 mg total) by mouth daily with breakfast. (Patient not taking: Reported on 01/08/2022) 5 tablet 0   [START ON 01/09/2022] rosuvastatin (CRESTOR) 5 MG tablet Take 1 tablet (5 mg total) by mouth 3 (three) times a week. 39 tablet 3   No current facility-administered medications for this visit.    Allergies:   Patient has no known allergies.   Social History:  The patient  reports that he quit smoking about 23 years ago. His smoking use included cigarettes. He has a 40.00 pack-year smoking history. His smokeless tobacco use includes snuff. He reports current alcohol use. He reports that he does not use drugs.   Family History:   family history includes Heart attack (age of onset: 44) in his father; Heart disease in his father; Hyperlipidemia in his father; Hypertension in his father.   Review of  Systems: Review of Systems  Constitutional: Negative.   HENT: Negative.    Respiratory: Negative.    Cardiovascular: Negative.   Gastrointestinal: Negative.   Musculoskeletal: Negative.   Neurological: Negative.   Psychiatric/Behavioral: Negative.    All other systems reviewed and are negative.  PHYSICAL EXAM: VS:  BP 138/64 (BP Location: Left Arm, Patient Position: Sitting, Cuff Size: Normal)   Pulse (!) 46   Ht '5\' 8"'$  (1.727 m)   Wt 202 lb 8 oz (91.9 kg)   BMI 30.79 kg/m  , BMI Body mass index is 30.79 kg/m. Constitutional:  oriented to person, place, and time. No distress.  HENT:  Head: Grossly normal Eyes:  no discharge. No scleral icterus.  Neck:  No JVD, no carotid bruits  Cardiovascular: Regular rate and rhythm, no murmurs appreciated Pulmonary/Chest: Clear to auscultation bilaterally, no wheezes or rails Abdominal: Soft.  no distension.  no tenderness.  Musculoskeletal: Normal range of motion Neurological:  normal muscle tone. Coordination normal. No atrophy Skin: Skin warm and dry Psychiatric: normal affect, pleasant  Recent Labs: 08/24/2021: ALT 28; BUN 19; Creatinine, Ser 0.84; Hemoglobin 13.0; Platelets 193; Potassium 4.6; Sodium 141; TSH 1.340    Lipid Panel Lab Results  Component Value Date   CHOL 163 06/12/2021   HDL 62 06/12/2021   LDLCALC 89 06/12/2021   TRIG 62 06/12/2021     Wt Readings from Last 3 Encounters:  01/08/22 202 lb 8 oz (91.9 kg)  12/06/21 201 lb 14.4 oz (91.6 kg)  10/05/21 204 lb 9.6 oz (92.8 kg)     ASSESSMENT AND PLAN:  Problem List Items Addressed This Visit       Cardiology Problems   High cholesterol   Relevant Medications   rosuvastatin (CRESTOR) 5 MG tablet (Start on 01/09/2022)   Hypertension   Relevant Medications   rosuvastatin (CRESTOR) 5 MG tablet (Start on 01/09/2022)   Other Relevant Orders   EKG 12-Lead   Aortic atherosclerosis ([place]) - Primary   Relevant Medications   rosuvastatin (CRESTOR) 5 MG tablet  (Start on 01/09/2022)   Other Relevant Orders   EKG 12-Lead     Other   Controlled type 2 diabetes mellitus with complication, without long-term current use of insulin (HCC)   Relevant Medications   rosuvastatin (CRESTOR) 5 MG tablet (Start on 01/09/2022)   Other Relevant Orders   EKG 12-Lead   Smokeless tobacco use   Other Visit Diagnoses     Atrial tachycardia       Relevant Medications   rosuvastatin (CRESTOR) 5 MG tablet (Start on 01/09/2022)   Other Relevant Orders   EKG 12-Lead   OSA on CPAP         Coronary calcification Low calcium score 185 Reports that he stopped Crestor 3 times a week secondary to fatigue/milligrams sleepy, was taking this in the morning Cholesterol has been above goal, recommend he try to restart rosuvastatin and take this in the evening Continue Zetia  Bradycardia asymptomatic, seen by EP,  No near syncope or syncope No indication for pacemaker No change on today's visit, blood pressure stable  Essential hypertension Blood pressure is well controlled on today's visit. No changes made to the medications.  Hyperlipidemia Recommend he try to restart Crestor 3 days a week, take in the evenings, stay on Zetia  Obstructive sleep apnea on CPAP Compliant with a CPAP   Total encounter time more than 30 minutes  Greater than 50% was spent in counseling and coordination of care with the patient  Signed, Esmond Plants, M.D., Ph.D. [place], [place]

## 2022-01-08 ENCOUNTER — Encounter: Payer: Self-pay | Admitting: Cardiovascular Disease

## 2022-01-08 ENCOUNTER — Ambulatory Visit: Payer: BC Managed Care – PPO | Attending: Cardiovascular Disease | Admitting: Cardiovascular Disease

## 2022-01-08 VITALS — BP 138/64 | HR 46 | Ht 68.0 in | Wt 202.5 lb

## 2022-01-08 DIAGNOSIS — E7800 Pure hypercholesterolemia, unspecified: Secondary | ICD-10-CM | POA: Diagnosis not present

## 2022-01-08 DIAGNOSIS — E118 Type 2 diabetes mellitus with unspecified complications: Secondary | ICD-10-CM | POA: Diagnosis not present

## 2022-01-08 DIAGNOSIS — G4733 Obstructive sleep apnea (adult) (pediatric): Secondary | ICD-10-CM

## 2022-01-08 DIAGNOSIS — I4719 Other supraventricular tachycardia: Secondary | ICD-10-CM

## 2022-01-08 DIAGNOSIS — I10 Essential (primary) hypertension: Secondary | ICD-10-CM | POA: Diagnosis not present

## 2022-01-08 DIAGNOSIS — Z720 Tobacco use: Secondary | ICD-10-CM

## 2022-01-08 DIAGNOSIS — I700 Atherosclerosis of aorta: Secondary | ICD-10-CM | POA: Diagnosis not present

## 2022-01-08 NOTE — Patient Instructions (Signed)
Medication Instructions:  No changes  Try the crestor 3x a week, take at night  If you need a refill on your cardiac medications before your next appointment, please call your pharmacy.   Lab work: No new labs needed  Testing/Procedures: No new testing needed  Follow-Up: At [facility], you and your health needs are our priority.  As part of our continuing mission to provide you with exceptional heart care, we have created designated Provider Care Teams.  These Care Teams include your primary Cardiologist (physician) and Advanced Practice Providers (APPs -  Physician Assistants and Nurse Practitioners) who all work together to provide you with the care you need, when you need it.  You will need a follow up appointment in 12 months  Providers on your designated Care Team:   Murray Hodgkins, NP Christell Faith, PA-C Cadence Kathlen Mody, [place]  COVID-19 Vaccine Information can be found at: [email or web address] For questions related to vaccine distribution or appointments, please email [email or web address] or call [phone].

## 2022-02-04 ENCOUNTER — Other Ambulatory Visit: Payer: Self-pay | Admitting: Family Medicine

## 2022-02-04 DIAGNOSIS — E7800 Pure hypercholesterolemia, unspecified: Secondary | ICD-10-CM

## 2022-02-04 DIAGNOSIS — M1000 Idiopathic gout, unspecified site: Secondary | ICD-10-CM

## 2022-02-05 ENCOUNTER — Other Ambulatory Visit: Payer: Self-pay | Admitting: Family Medicine

## 2022-02-05 DIAGNOSIS — M5136 Other intervertebral disc degeneration, lumbar region: Secondary | ICD-10-CM

## 2022-02-05 DIAGNOSIS — M1000 Idiopathic gout, unspecified site: Secondary | ICD-10-CM

## 2022-02-05 NOTE — Telephone Encounter (Signed)
Requested Prescriptions  Pending Prescriptions Disp Refills  . hydrochlorothiazide (HYDRODIURIL) 12.5 MG tablet [Pharmacy Med Name: HYDROCHLOROTHIAZIDE 12.5 MG TAB] 90 tablet 0    Sig: TAKE 1 TABLET BY MOUTH DAILY     Cardiovascular: Diuretics - Thiazide Passed - 02/05/2022  9:51 AM      Passed - Cr in normal range and within 180 days    Creatinine, Ser  Date Value Ref Range Status  08/24/2021 0.84 0.76 - 1.27 mg/dL Final         Passed - K in normal range and within 180 days    Potassium  Date Value Ref Range Status  08/24/2021 4.6 3.5 - 5.2 mmol/L Final         Passed - Na in normal range and within 180 days    Sodium  Date Value Ref Range Status  08/24/2021 141 134 - 144 mmol/L Final         Passed - Last BP in normal range    BP Readings from Last 1 Encounters:  01/08/22 138/64         Passed - Valid encounter within last 6 months    Recent Outpatient Visits          2 months ago COPD exacerbation ([place])   [place], Megan P, DO   4 months ago Controlled type 2 diabetes mellitus with complication, without long-term current use of insulin ([place])   [place], Megan P, DO   5 months ago Chronic fatigue   [facility] [place], [place], DO   7 months ago Routine general medical examination at a health care facility   [place], [place], DO   1 year ago Routine general medical examination at a health care facility   [place], Barb Merino, DO      Future Appointments            In 2 months Wynetta Emery, Barb Merino, DO [facility], [facility]

## 2022-02-05 NOTE — Telephone Encounter (Signed)
Requested Prescriptions  Pending Prescriptions Disp Refills  . allopurinol (ZYLOPRIM) 300 MG tablet [Pharmacy Med Name: ALLOPURINOL 300 MG TAB] 90 tablet 1    Sig: TAKE 1 TABLET BY MOUTH DAILY     Endocrinology:  Gout Agents - allopurinol Passed - 02/04/2022  5:46 PM      Passed - Uric Acid in normal range and within 360 days    Uric Acid  Date Value Ref Range Status  06/12/2021 4.3 3.8 - 8.4 mg/dL Final    Comment:               Therapeutic target for gout patients: <6.0         Passed - Cr in normal range and within 360 days    Creatinine, Ser  Date Value Ref Range Status  08/24/2021 0.84 0.76 - 1.27 mg/dL Final         Passed - Valid encounter within last 12 months    Recent Outpatient Visits          2 months ago COPD exacerbation ([place])   [place], Megan P, DO   4 months ago Controlled type 2 diabetes mellitus with complication, without long-term current use of insulin ([place])   [place], Megan P, DO   5 months ago Chronic fatigue   [facility] [place], Megan P, DO   7 months ago Routine general medical examination at a health care facility   [place], [place], DO   1 year ago Routine general medical examination at a health care facility   [place], [place] P, DO      Future Appointments            In 2 months Johnson, Megan P, DO [facility] [place]           Passed - CBC within normal limits and completed in the last 12 months    WBC  Date Value Ref Range Status  08/24/2021 7.3 3.4 - 10.8 x10E3/uL Final  05/26/2020 10.7 (H) 4.0 - 10.5 K/uL Final   RBC  Date Value Ref Range Status  08/24/2021 4.09 (L) 4.14 - 5.80 x10E6/uL Final  05/26/2020 4.15 (L) 4.22 - 5.81 MIL/uL Final   Hemoglobin  Date Value Ref Range Status  08/24/2021 13.0 13.0 - 17.7 g/dL Final   Hematocrit  Date Value Ref Range Status  08/24/2021 39.5 37.5 - 51.0 % Final   MCHC   Date Value Ref Range Status  08/24/2021 32.9 31.5 - 35.7 g/dL Final  05/26/2020 33.9 30.0 - 36.0 g/dL Final   [facility]  Date Value Ref Range Status  08/24/2021 31.8 26.6 - 33.0 pg Final  05/26/2020 31.6 26.0 - 34.0 pg Final   MCV  Date Value Ref Range Status  08/24/2021 97 79 - 97 fL Final   No results found for: "PLTCOUNTKUC", "LABPLAT", "POCPLA" RDW  Date Value Ref Range Status  08/24/2021 12.7 11.6 - 15.4 % Final         . ezetimibe (ZETIA) 10 MG tablet [Pharmacy Med Name: EZETIMIBE 10 MG TAB] 90 tablet 0    Sig: TAKE 1 TABLET BY MOUTH DAILY     Cardiovascular:  Antilipid - Sterol Transport Inhibitors Failed - 02/04/2022  5:46 PM      Failed - Lipid Panel in normal range within the last 12 months    Cholesterol, Total  Date Value Ref Range Status  06/12/2021 163 100 - 199 mg/dL  Final   Cholesterol Piccolo, Waived  Date Value Ref Range Status  09/22/2018 212 (H) <200 mg/dL Final    Comment:                            Desirable                <200                         Borderline High      200- 239                         High                     >239    LDL Chol Calc (NIH)  Date Value Ref Range Status  06/12/2021 89 0 - 99 mg/dL Final   HDL  Date Value Ref Range Status  06/12/2021 62 >39 mg/dL Final   Triglycerides  Date Value Ref Range Status  06/12/2021 62 0 - 149 mg/dL Final   Triglycerides Piccolo,Waived  Date Value Ref Range Status  09/22/2018 192 (H) <150 mg/dL Final    Comment:                            Normal                   <150                         Borderline High     150 - 199                         High                200 - 499                         Very High                >499          Passed - AST in normal range and within 360 days    AST  Date Value Ref Range Status  08/24/2021 23 0 - 40 IU/L Final   AST (SGOT) Piccolo, Waived  Date Value Ref Range Status  09/22/2018 17 11 - 38 U/L Final         Passed - ALT in  normal range and within 360 days    ALT  Date Value Ref Range Status  08/24/2021 28 0 - 44 IU/L Final   ALT (SGPT) Piccolo, Waived  Date Value Ref Range Status  09/22/2018 22 10 - 47 U/L Final         Passed - Patient is not pregnant      Passed - Valid encounter within last 12 months    Recent Outpatient Visits          2 months ago COPD exacerbation ([place])   [place], Megan P, DO   4 months ago Controlled type 2 diabetes mellitus with complication, without long-term current use of insulin ([place])   [place], Megan P, DO   5 months ago Chronic fatigue   [facility] [place], [place], DO  7 months ago Routine general medical examination at a health care facility   [place], [place], DO   1 year ago Routine general medical examination at a health care facility   [facility] [place] Valerie Roys, DO      Future Appointments            In 2 months Wynetta Emery, Barb Merino, DO [facility], [facility]

## 2022-02-06 NOTE — Telephone Encounter (Signed)
Requested Prescriptions  Pending Prescriptions Disp Refills  . naproxen (NAPROSYN) 500 MG tablet [Pharmacy Med Name: NAPROXEN 500 MG TAB] 180 tablet 1    Sig: TAKE ONE TABLET TWICE DAILY WITH MEALS     Analgesics:  NSAIDS Failed - 02/05/2022  4:27 PM      Failed - Manual Review: Labs are only required if the patient has taken medication for more than 8 weeks.      Passed - Cr in normal range and within 360 days    Creatinine, Ser  Date Value Ref Range Status  08/24/2021 0.84 0.76 - 1.27 mg/dL Final         Passed - HGB in normal range and within 360 days    Hemoglobin  Date Value Ref Range Status  08/24/2021 13.0 13.0 - 17.7 g/dL Final         Passed - PLT in normal range and within 360 days    Platelets  Date Value Ref Range Status  08/24/2021 193 150 - 450 x10E3/uL Final         Passed - HCT in normal range and within 360 days    Hematocrit  Date Value Ref Range Status  08/24/2021 39.5 37.5 - 51.0 % Final         Passed - eGFR is 30 or above and within 360 days    GFR calc Af Amer  Date Value Ref Range Status  06/02/2020 99 >59 mL/min/1.73 Final    Comment:    **In accordance with recommendations from the NKF-ASN Task force,**   [facility] is in the process of updating its eGFR calculation to the   2021 CKD-EPI creatinine equation that estimates kidney function   without a race variable.    GFR, Estimated  Date Value Ref Range Status  05/26/2020 >60 >60 mL/min Final    Comment:    (NOTE) Calculated using the CKD-EPI Creatinine Equation (2021)    GFR calc non Af Amer  Date Value Ref Range Status  06/02/2020 85 >59 mL/min/1.73 Final   eGFR  Date Value Ref Range Status  08/24/2021 97 >59 mL/min/1.73 Final         Passed - Patient is not pregnant      Passed - Valid encounter within last 12 months    Recent Outpatient Visits          2 months ago COPD exacerbation ([place])   [place], Megan P, DO   4 months ago Controlled type 2  diabetes mellitus with complication, without long-term current use of insulin ([place])   [place], Megan P, DO   5 months ago Chronic fatigue   [facility] [place], [place], DO   7 months ago Routine general medical examination at a health care facility   [place], [place], DO   1 year ago Routine general medical examination at a health care facility   [place], Barb Merino, DO      Future Appointments            In 2 months Wynetta Emery, Barb Merino, DO [facility], [place]

## 2022-02-07 ENCOUNTER — Other Ambulatory Visit: Payer: Self-pay | Admitting: Family Medicine

## 2022-02-07 DIAGNOSIS — I10 Essential (primary) hypertension: Secondary | ICD-10-CM

## 2022-02-07 NOTE — Telephone Encounter (Signed)
Requested Prescriptions  Pending Prescriptions Disp Refills   losartan (COZAAR) 100 MG tablet [Pharmacy Med Name: LOSARTAN POTASSIUM 100 MG TAB] 90 tablet 1    Sig: TAKE 1 TABLET BY MOUTH DAILY     Cardiovascular:  Angiotensin Receptor Blockers Passed - 02/07/2022 12:16 PM      Passed - Cr in normal range and within 180 days    Creatinine, Ser  Date Value Ref Range Status  08/24/2021 0.84 0.76 - 1.27 mg/dL Final         Passed - K in normal range and within 180 days    Potassium  Date Value Ref Range Status  08/24/2021 4.6 3.5 - 5.2 mmol/L Final         Passed - Patient is not pregnant      Passed - Last BP in normal range    BP Readings from Last 1 Encounters:  01/08/22 138/64         Passed - Valid encounter within last 6 months    Recent Outpatient Visits           2 months ago COPD exacerbation ([place])   [place], Megan P, DO   4 months ago Controlled type 2 diabetes mellitus with complication, without long-term current use of insulin ([place])   [place], Megan P, DO   5 months ago Chronic fatigue   [place], [place], DO   8 months ago Routine general medical examination at a health care facility   [place], [place], DO   1 year ago Routine general medical examination at a health care facility   [place], Barb Merino, DO       Future Appointments             In 2 months Wynetta Emery, Barb Merino, DO [facility], [place]

## 2022-04-03 ENCOUNTER — Other Ambulatory Visit: Payer: Self-pay | Admitting: Family Medicine

## 2022-04-03 DIAGNOSIS — E785 Hyperlipidemia, unspecified: Secondary | ICD-10-CM

## 2022-04-05 NOTE — Telephone Encounter (Signed)
Unable to refill per protocol, Rx request is too soon. Last refill 12/20/21 for 90 days and 1 refill. Will refuse.  Requested Prescriptions  Pending Prescriptions Disp Refills   atorvastatin (LIPITOR) 80 MG tablet [Pharmacy Med Name: ATORVASTATIN CALCIUM 80 MG TAB] 90 tablet 1    Sig: TAKE ONE TABLET BY MOUTH EVERY DAY     Cardiovascular:  Antilipid - Statins Failed - 04/03/2022 11:48 AM      Failed - Lipid Panel in normal range within the last 12 months    Cholesterol, Total  Date Value Ref Range Status  06/12/2021 163 100 - 199 mg/dL Final   Cholesterol Piccolo, Waived  Date Value Ref Range Status  09/22/2018 212 (H) <200 mg/dL Final    Comment:                            Desirable                <200                         Borderline High      200- 239                         High                     >239    LDL Chol Calc (NIH)  Date Value Ref Range Status  06/12/2021 89 0 - 99 mg/dL Final   HDL  Date Value Ref Range Status  06/12/2021 62 >39 mg/dL Final   Triglycerides  Date Value Ref Range Status  06/12/2021 62 0 - 149 mg/dL Final   Triglycerides Piccolo,Waived  Date Value Ref Range Status  09/22/2018 192 (H) <150 mg/dL Final    Comment:                            Normal                   <150                         Borderline High     150 - 199                         High                200 - 499                         Very High                >499          Passed - Patient is not pregnant      Passed - Valid encounter within last 12 months    Recent Outpatient Visits           4 months ago COPD exacerbation ([place])   [place], Megan P, DO   6 months ago Controlled type 2 diabetes mellitus with complication, without long-term current use of insulin ([place])   [place], Megan P, DO   7 months ago Chronic fatigue   [facility] [place], [place], DO   9 months ago  Routine general medical  examination at a health care facility   [place], [place], DO   1 year ago Routine general medical examination at a health care facility   [facility], [place], DO       Future Appointments             In 4 days Wynetta Emery, Megan P, DO [place]             omeprazole (PRILOSEC) 20 MG capsule [Pharmacy Med Name: OMEPRAZOLE DR 20 MG CAP] 90 capsule 1    Sig: TAKE 1 CAPSULE BY MOUTH ONCE DAILY     Gastroenterology: Proton Pump Inhibitors Passed - 04/03/2022 11:48 AM      Passed - Valid encounter within last 12 months    Recent Outpatient Visits           4 months ago COPD exacerbation ([place])   [place], Megan P, DO   6 months ago Controlled type 2 diabetes mellitus with complication, without long-term current use of insulin ([place])   [place], Megan P, DO   7 months ago Chronic fatigue   [facility] [place], [place], DO   9 months ago Routine general medical examination at a health care facility   [place], [place], DO   1 year ago Routine general medical examination at a health care facility   [facility], [place], DO       Future Appointments             In 4 days Wynetta Emery, Megan P, DO [place]             amLODipine (NORVASC) 2.5 MG tablet [Pharmacy Med Name: AMLODIPINE BESYLATE 2.5 MG TAB] 90 tablet 1    Sig: TAKE 1 TABLET BY MOUTH DAILY     Cardiovascular: Calcium Channel Blockers 2 Passed - 04/03/2022 11:48 AM      Passed - Last BP in normal range    BP Readings from Last 1 Encounters:  01/08/22 138/64         Passed - Last Heart Rate in normal range    Pulse Readings from Last 1 Encounters:  01/08/22 (!) 46         Passed - Valid encounter within last 6 months    Recent Outpatient Visits           4 months ago COPD exacerbation ([place])   [place], Megan P, DO   6 months ago Controlled type 2 diabetes mellitus with complication, without long-term current use of insulin ([place])   [place], Megan P, DO   7 months ago Chronic fatigue   [place], [place], DO   9 months ago Routine general medical examination at a health care facility   [place], [place], DO   1 year ago Routine general medical examination at a health care facility   [place], Barb Merino, DO       Future Appointments             In 4 days Valerie Roys, DO [facility], [place]

## 2022-04-08 IMAGING — MR MR CERVICAL SPINE W/O CM
5 series · 30 of 48 positions shown · non-contrast
Comparison: 06/16/2013

CLINICAL DATA: Right shoulder pain extending down the arm with
numbness. Symptoms for 3 months

EXAM:
MRI CERVICAL SPINE WITHOUT CONTRAST
TECHNIQUE: Multiplanar, multisequence MR imaging of the cervical spine was
performed. No intravenous contrast was administered.

[Series 6: T1 · sagittal · 3.0mm · 0.86mm/px · 6 of 12 slices shown]
[im 1/12]
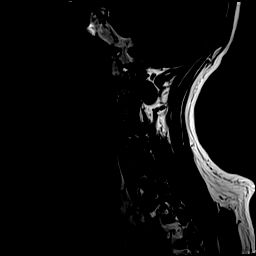
[im 3/12]
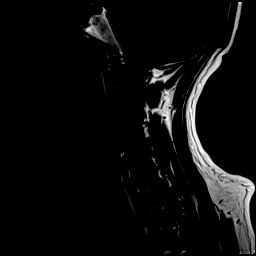
[im 5/12]
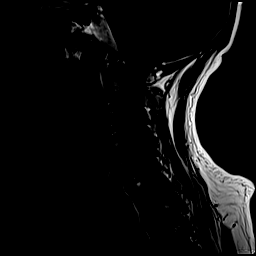
[im 7/12]
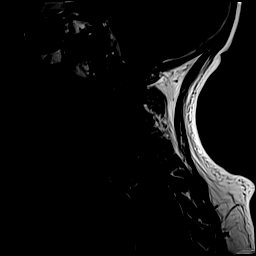
[im 9/12]
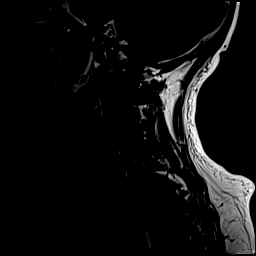
[im 12/12]
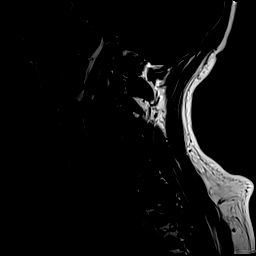

[Series 7: STIR · sagittal · 3.0mm · 0.34mm/px · 6 of 12 slices shown]
[im 1/12]
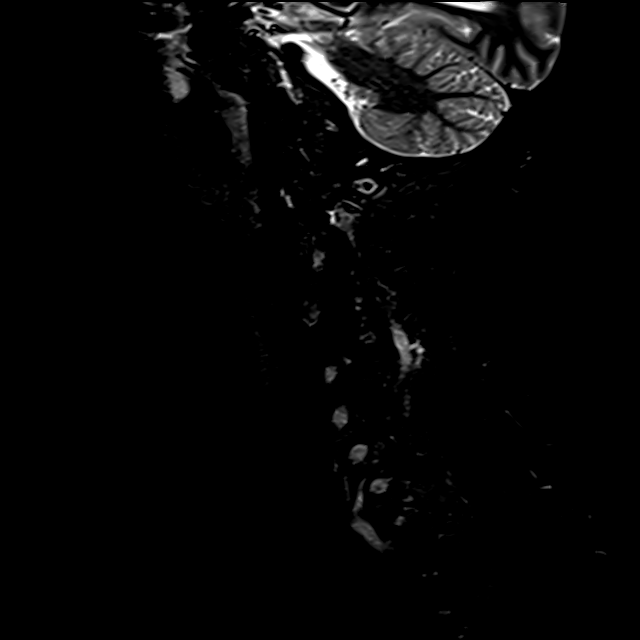
[im 3/12]
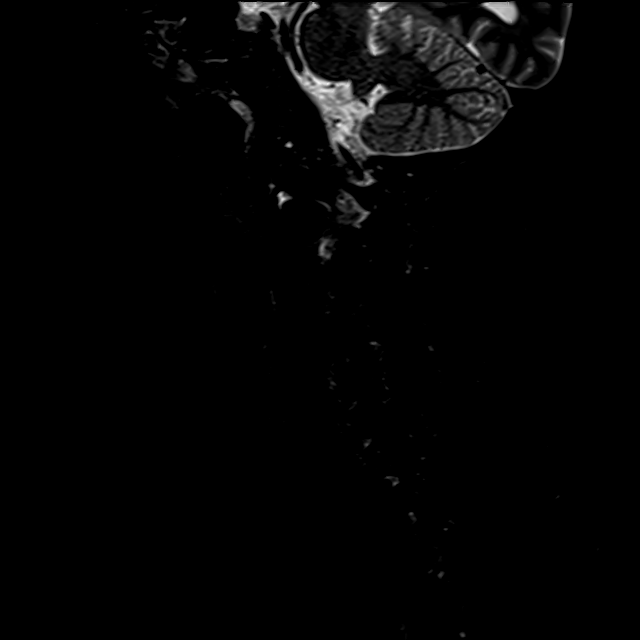
[im 5/12]
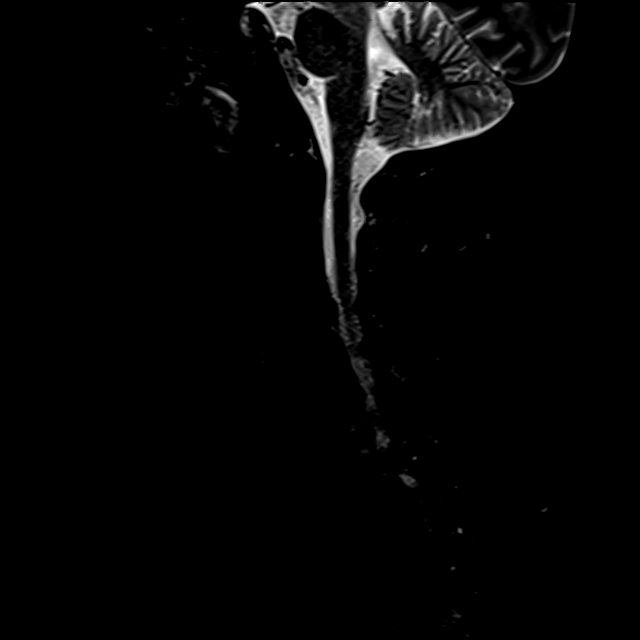
[im 7/12]
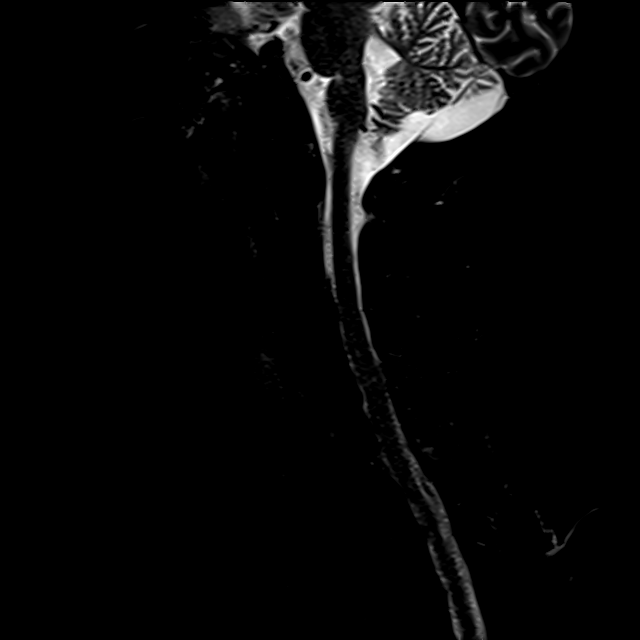
[im 9/12]
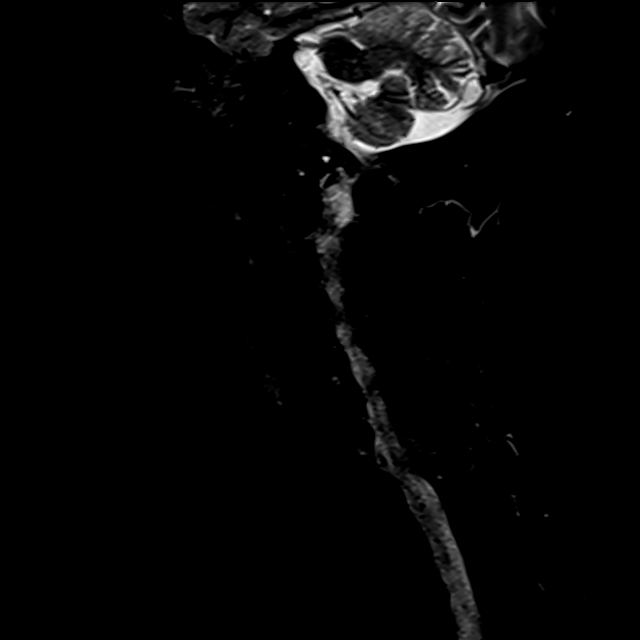
[im 12/12]
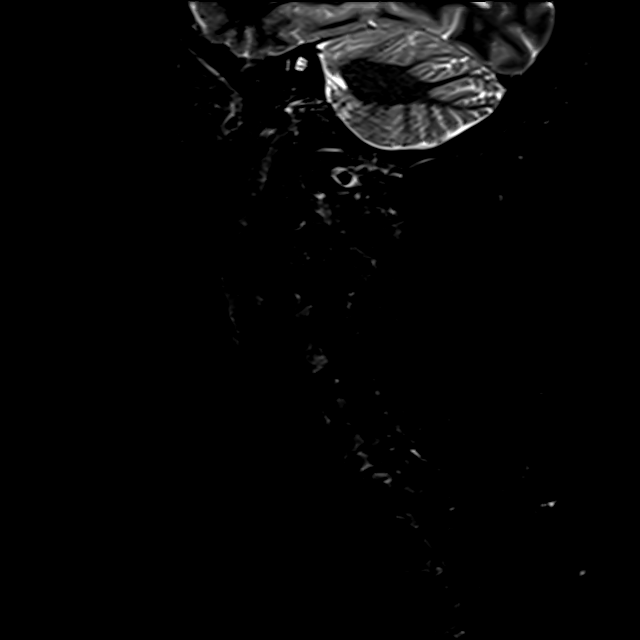

[Series 8: T2 · sagittal · 3.0mm · 0.69mm/px · 6 of 12 slices shown (1 of 2)]
[im 1/12]
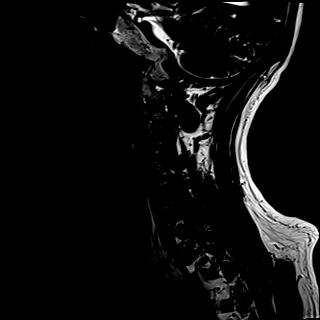
[im 3/12]
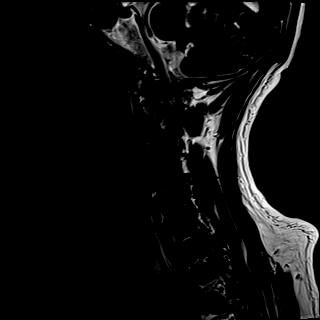
[im 5/12]
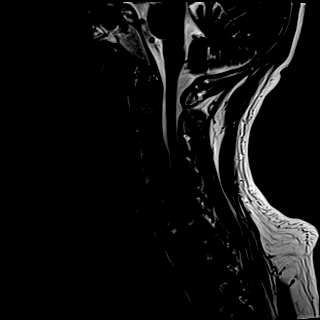
[im 7/12]
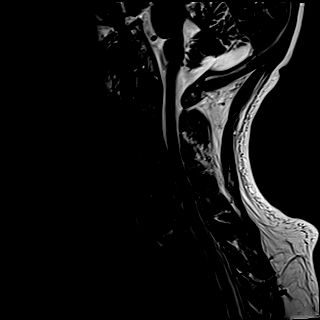
[im 9/12]
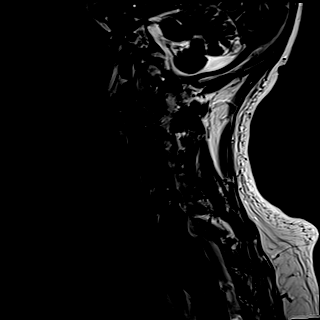
[im 12/12]
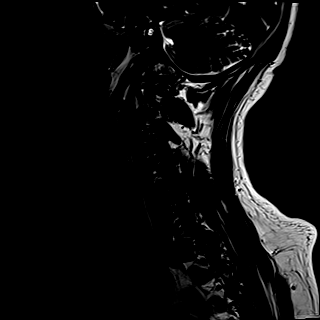

[Series 9: T2 · axial · 3.0mm · 0.70mm/px · z∈[-27,+74]mm · 9 of 32 slices shown (2 of 2)]
[im 1/32]
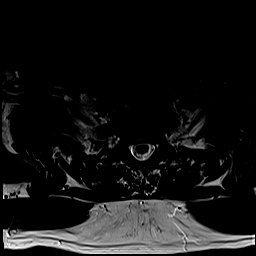
[im 5/32]
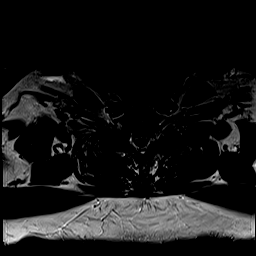
[im 9/32]
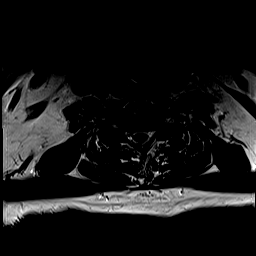
[im 14/32]
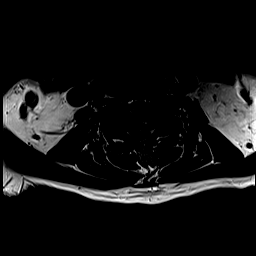
[im 16/32]
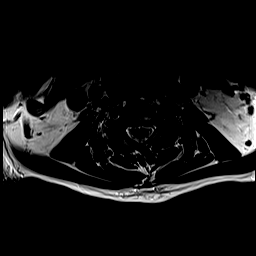
[im 18/32]
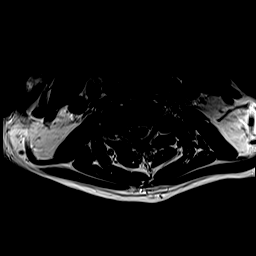
[im 23/32]
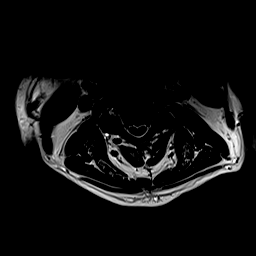
[im 27/32]
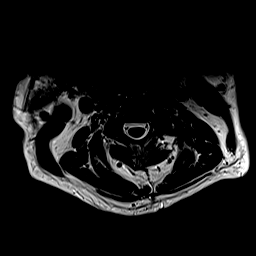
[im 32/32]
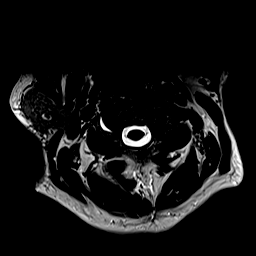

[Series 10: GRE · axial · 3.0mm · 0.35mm/px · z∈[-27,-1]mm · 3 of 32 slices shown]
[im 1/32]
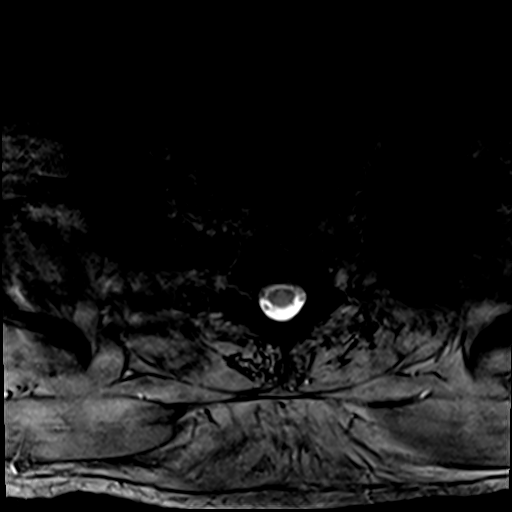
[im 5/32]
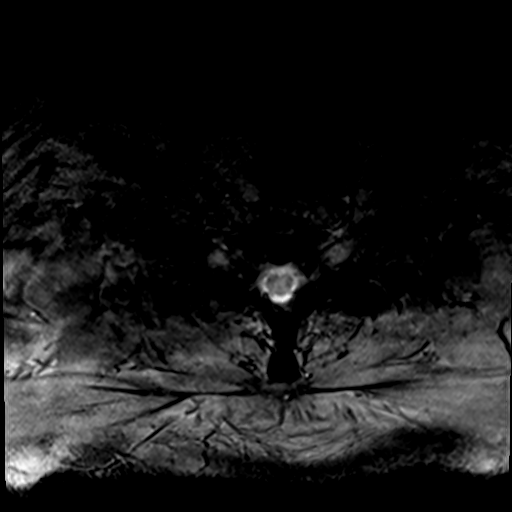
[im 9/32]
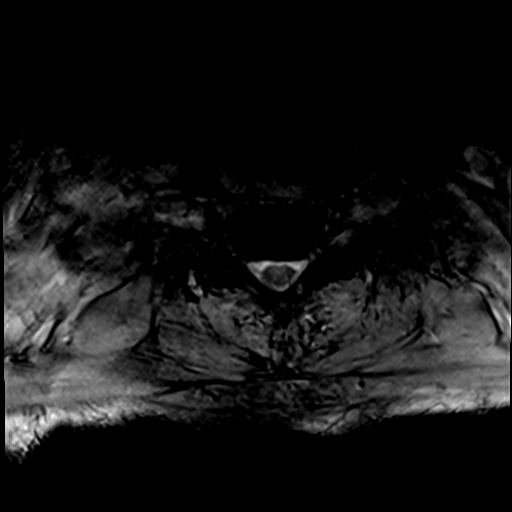

[30 of 48 positions shown; findings below may reference images not displayed]

FINDINGS: Alignment: Degenerative anterolisthesis at C4-5 and especially
C7-T1, measuring 4 mm at the latter. Mild retrolisthesis at C5-6.

Vertebrae: Mild degenerative marrow edema at the left more than
right C7-T1 facets.

Cord: Normal signal and morphology

Posterior Fossa, vertebral arteries, paraspinal tissues: Negative

Disc levels:

C2-3: Asymmetric left facet spurring with mild foraminal narrowing

C3-4: Central disc protrusion. Bilateral uncovertebral and facet
spurring. Advanced left and more moderate right foraminal stenosis

C4-5: Facet spurring and mild anterolisthesis. Uncovertebral ridging
bilaterally. Bilateral foraminal impingement, most advanced on the
right

C5-6: Disc collapse with endplate and uncovertebral ridging. Central
disc protrusion contacting the ventral cord. Facet spurring
asymmetric to the right. Advanced bilateral foraminal impingement

C6-7: Disc narrowing and bulging. Negative facets. Biforaminal
impingement

C7-T1:Facet osteoarthritis with spurring and anterolisthesis.
Advanced right and more moderate left foraminal narrowing.

T1-2: Small right paracentral protrusion only seen on sagittal
images.
IMPRESSION: 1. Generalized cervical spine degeneration especially affecting
facets with listhesis.
2. Right-sided foraminal impingement at C4-5 to C7-T1.
3. Left-sided foraminal impingement at C3-4 to C6-7.
4. Up to mild spinal stenosis at C5-6.
5. Mild active facet arthritis at C7-T1.

## 2022-04-09 ENCOUNTER — Ambulatory Visit: Payer: BC Managed Care – PPO | Admitting: Family Medicine

## 2022-04-09 ENCOUNTER — Encounter: Payer: Self-pay | Admitting: Family Medicine

## 2022-04-09 VITALS — BP 131/62 | HR 40 | Temp 98.0°F | Ht 68.0 in | Wt 201.6 lb

## 2022-04-09 DIAGNOSIS — E118 Type 2 diabetes mellitus with unspecified complications: Secondary | ICD-10-CM | POA: Diagnosis not present

## 2022-04-09 DIAGNOSIS — M1000 Idiopathic gout, unspecified site: Secondary | ICD-10-CM | POA: Diagnosis not present

## 2022-04-09 DIAGNOSIS — M5136 Other intervertebral disc degeneration, lumbar region: Secondary | ICD-10-CM

## 2022-04-09 DIAGNOSIS — I700 Atherosclerosis of aorta: Secondary | ICD-10-CM

## 2022-04-09 DIAGNOSIS — I10 Essential (primary) hypertension: Secondary | ICD-10-CM

## 2022-04-09 DIAGNOSIS — E7800 Pure hypercholesterolemia, unspecified: Secondary | ICD-10-CM | POA: Diagnosis not present

## 2022-04-09 LAB — MICROALBUMIN, URINE WAIVED
Creatinine, Urine Waived: 300 mg/dL (ref 10–300)
Microalb, Ur Waived: 80 mg/L — ABNORMAL HIGH (ref 0–19)

## 2022-04-09 LAB — BAYER DCA HB A1C WAIVED: HB A1C (BAYER DCA - WAIVED): 5.9 % — ABNORMAL HIGH (ref 4.8–5.6)

## 2022-04-09 MED ORDER — NAPROXEN 500 MG PO TABS: ORAL_TABLET | ORAL | 1 refills | Status: DC

## 2022-04-09 MED ORDER — GABAPENTIN 300 MG PO CAPS: 600.0000 mg | ORAL_CAPSULE | Freq: Three times a day (TID) | ORAL | 1 refills | Status: DC

## 2022-04-09 MED ORDER — LOSARTAN POTASSIUM 100 MG PO TABS: ORAL_TABLET | ORAL | 1 refills | Status: DC

## 2022-04-09 MED ORDER — EZETIMIBE 10 MG PO TABS: 10.0000 mg | ORAL_TABLET | Freq: Every day | ORAL | 1 refills | Status: DC

## 2022-04-09 MED ORDER — HYDROCHLOROTHIAZIDE 12.5 MG PO TABS: ORAL_TABLET | ORAL | 1 refills | Status: DC

## 2022-04-09 MED ORDER — ALBUTEROL SULFATE HFA 108 (90 BASE) MCG/ACT IN AERS: INHALATION_SPRAY | RESPIRATORY_TRACT | 2 refills | Status: DC

## 2022-04-09 MED ORDER — COLCHICINE 0.6 MG PO TABS: 0.6000 mg | ORAL_TABLET | Freq: Every day | ORAL | 1 refills | Status: DC | PRN

## 2022-04-09 MED ORDER — OMEPRAZOLE 20 MG PO CPDR: 20.0000 mg | DELAYED_RELEASE_CAPSULE | Freq: Every day | ORAL | 1 refills | Status: DC

## 2022-04-09 NOTE — Assessment & Plan Note (Signed)
Will keep BP and cholesterol under good control. Continue to monitor. Call with any concerns.  

## 2022-04-09 NOTE — Assessment & Plan Note (Signed)
Doing great with A1c of 5.9. Continue current regimen. Continue to monitor.

## 2022-04-09 NOTE — Assessment & Plan Note (Signed)
Under good control on current regimen. Continue current regimen. Continue to monitor. Call with any concerns. Refills given. Labs drawn today.   

## 2022-04-09 NOTE — Progress Notes (Signed)
BP 131/62   Pulse (!) 40   Temp 98 F (36.7 C) (Oral)   Ht '5\' 8"'$  (1.727 m)   Wt 201 lb 9.6 oz (91.4 kg)   SpO2 96%   BMI 30.65 kg/m    Subjective:    Patient ID: Daniel Cook, male    DOB: 10/11/1955, 67 y.o.   MRN: [id number]  HPI: DRAGO HAMMONDS is a 67 y.o. male  Chief Complaint  Patient presents with   Diabetes    Patient declines having a recent Diabetic Eye Exam.    HYPERTENSION / HYPERLIPIDEMIA Satisfied with current treatment? yes Duration of hypertension: chronic BP monitoring frequency: not checking BP medication side effects: no Past BP meds: amlodipine, HCTZ, losartan Duration of hyperlipidemia: chronic Cholesterol medication side effects: no Cholesterol supplements: fish oil Past cholesterol medications: atorvastatin, zetia Medication compliance: excellent compliance Aspirin: yes Recent stressors: no Recurrent headaches: no Visual changes: no Palpitations: no Dyspnea: no Chest pain: no Lower extremity edema: no Dizzy/lightheaded: no  No gout flares. Tolerating medicine well.   DIABETES Hypoglycemic episodes:no Polydipsia/polyuria: no Visual disturbance: no Chest pain: no Paresthesias: no Glucose Monitoring: no  Accucheck frequency: Not Checking Taking Insulin?: no Blood Pressure Monitoring: not checking Retinal Examination: Up to Date Foot Exam: Up to Date Diabetic Education: Completed Pneumovax: Up to Date Influenza: Not up to Date Aspirin: yes  Relevant past medical, surgical, family and social history reviewed and updated as indicated. Interim medical history since our last visit reviewed. Allergies and medications reviewed and updated.  Review of Systems  Constitutional: Negative.   Cardiovascular: Negative.   Gastrointestinal: Negative.   Musculoskeletal: Negative.   Neurological: Negative.   Psychiatric/Behavioral: Negative.      Per HPI unless specifically indicated above     Objective:    BP 131/62   Pulse (!)  40   Temp 98 F (36.7 C) (Oral)   Ht '5\' 8"'$  (1.727 m)   Wt 201 lb 9.6 oz (91.4 kg)   SpO2 96%   BMI 30.65 kg/m   Wt Readings from Last 3 Encounters:  04/09/22 201 lb 9.6 oz (91.4 kg)  01/08/22 202 lb 8 oz (91.9 kg)  12/06/21 201 lb 14.4 oz (91.6 kg)    Physical Exam Vitals and nursing note reviewed.  Constitutional:      General: He is not in acute distress.    Appearance: Normal appearance. He is not ill-appearing, toxic-appearing or diaphoretic.  HENT:     Head: Normocephalic and atraumatic.     Right Ear: External ear normal.     Left Ear: External ear normal.     Nose: Nose normal.     Mouth/Throat:     Mouth: Mucous membranes are moist.     Pharynx: Oropharynx is clear.  Eyes:     General: No scleral icterus.       Right eye: No discharge.        Left eye: No discharge.     Extraocular Movements: Extraocular movements intact.     Conjunctiva/sclera: Conjunctivae normal.     Pupils: Pupils are equal, round, and reactive to light.  Cardiovascular:     Rate and Rhythm: Normal rate and regular rhythm.     Pulses: Normal pulses.     Heart sounds: Normal heart sounds. No murmur heard.    No friction rub. No gallop.  Pulmonary:     Effort: Pulmonary effort is normal. No respiratory distress.     Breath sounds: Normal breath sounds.  No stridor. No wheezing, rhonchi or rales.  Chest:     Chest wall: No tenderness.  Musculoskeletal:        General: Normal range of motion.     Cervical back: Normal range of motion and neck supple.  Skin:    General: Skin is warm and dry.     Capillary Refill: Capillary refill takes less than 2 seconds.     Coloration: Skin is not jaundiced or pale.     Findings: No bruising, erythema, lesion or rash.  Neurological:     General: No focal deficit present.     Mental Status: He is alert and oriented to person, place, and time. Mental status is at baseline.  Psychiatric:        Mood and Affect: Mood normal.        Behavior: Behavior  normal.        Thought Content: Thought content normal.        Judgment: Judgment normal.     Results for orders placed or performed in visit on 04/09/22  Bayer DCA Hb A1c Waived  Result Value Ref Range   HB A1C (BAYER DCA - WAIVED) 5.9 (H) 4.8 - 5.6 %  Microalbumin, Urine Waived  Result Value Ref Range   Microalb, Ur Waived 80 (H) 0 - 19 mg/L   Creatinine, Urine Waived 300 10 - 300 mg/dL   Microalb/Creat Ratio 30-300 (H) <30 mg/g      Assessment & Plan:   Problem List Items Addressed This Visit       Cardiovascular and Mediastinum   Hypertension    Under good control on current regimen. Continue current regimen. Continue to monitor. Call with any concerns. Refills given. Labs drawn today.        Relevant Medications   ezetimibe (ZETIA) 10 MG tablet   losartan (COZAAR) 100 MG tablet   hydrochlorothiazide (HYDRODIURIL) 12.5 MG tablet   Aortic atherosclerosis (HCC)    Will keep BP and cholesterol under good control. Continue to monitor. Call with any concerns.       Relevant Medications   ezetimibe (ZETIA) 10 MG tablet   losartan (COZAAR) 100 MG tablet   hydrochlorothiazide (HYDRODIURIL) 12.5 MG tablet     Endocrine   Controlled type 2 diabetes mellitus with complication, without long-term current use of insulin (HCC)    Doing great with A1c of 5.9. Continue current regimen. Continue to monitor.       Relevant Medications   losartan (COZAAR) 100 MG tablet   Other Relevant Orders   Comprehensive metabolic panel   CBC with Differential/Platelet   Bayer DCA Hb A1c Waived (Completed)   Microalbumin, Urine Waived (Completed)     Musculoskeletal and Integument   DDD (degenerative disc disease), lumbar   Relevant Medications   colchicine 0.6 MG tablet   gabapentin (NEURONTIN) 300 MG capsule   naproxen (NAPROSYN) 500 MG tablet     Other   High cholesterol - Primary    Under good control on current regimen. Continue current regimen. Continue to monitor. Call with  any concerns. Refills given. Labs drawn today.       Relevant Medications   ezetimibe (ZETIA) 10 MG tablet   losartan (COZAAR) 100 MG tablet   hydrochlorothiazide (HYDRODIURIL) 12.5 MG tablet   Other Relevant Orders   Comprehensive metabolic panel   CBC with Differential/Platelet   Lipid Panel w/o Chol/HDL Ratio   Gout    Under good control on current regimen. Continue current regimen.  Continue to monitor. Call with any concerns. Refills given. Labs drawn today.       Relevant Medications   naproxen (NAPROSYN) 500 MG tablet   Other Relevant Orders   Uric acid   Other Visit Diagnoses     Essential hypertension       Relevant Medications   ezetimibe (ZETIA) 10 MG tablet   losartan (COZAAR) 100 MG tablet   hydrochlorothiazide (HYDRODIURIL) 12.5 MG tablet        Follow up plan: Return in about 6 months (around 10/08/2022) for physical.

## 2022-04-10 LAB — CBC WITH DIFFERENTIAL/PLATELET
Basophils Absolute: 0 10*3/uL (ref 0.0–0.2)
Basos: 1 %
EOS (ABSOLUTE): 0.2 10*3/uL (ref 0.0–0.4)
Eos: 5 %
Hematocrit: 38.4 % (ref 37.5–51.0)
Hemoglobin: 12.6 g/dL — ABNORMAL LOW (ref 13.0–17.7)
Immature Grans (Abs): 0 10*3/uL (ref 0.0–0.1)
Immature Granulocytes: 0 %
Lymphocytes Absolute: 1.4 10*3/uL (ref 0.7–3.1)
Lymphs: 30 %
MCH: 31.1 pg (ref 26.6–33.0)
MCHC: 32.8 g/dL (ref 31.5–35.7)
MCV: 95 fL (ref 79–97)
Monocytes Absolute: 0.5 10*3/uL (ref 0.1–0.9)
Monocytes: 10 %
Neutrophils Absolute: 2.6 10*3/uL (ref 1.4–7.0)
Neutrophils: 54 %
Platelets: 206 10*3/uL (ref 150–450)
RBC: 4.05 x10E6/uL — ABNORMAL LOW (ref 4.14–5.80)
RDW: 13.2 % (ref 11.6–15.4)
WBC: 4.8 10*3/uL (ref 3.4–10.8)

## 2022-04-10 LAB — COMPREHENSIVE METABOLIC PANEL
ALT: 26 IU/L (ref 0–44)
AST: 26 IU/L (ref 0–40)
Albumin/Globulin Ratio: 1.9 (ref 1.2–2.2)
Albumin: 4 g/dL (ref 3.9–4.9)
Alkaline Phosphatase: 59 IU/L (ref 44–121)
BUN/Creatinine Ratio: 19 (ref 10–24)
BUN: 23 mg/dL (ref 8–27)
Bilirubin Total: 0.3 mg/dL (ref 0.0–1.2)
CO2: 26 mmol/L (ref 20–29)
Calcium: 8.7 mg/dL (ref 8.6–10.2)
Chloride: 100 mmol/L (ref 96–106)
Creatinine, Ser: 1.22 mg/dL (ref 0.76–1.27)
Globulin, Total: 2.1 g/dL (ref 1.5–4.5)
Glucose: 112 mg/dL — ABNORMAL HIGH (ref 70–99)
Potassium: 4 mmol/L (ref 3.5–5.2)
Sodium: 142 mmol/L (ref 134–144)
Total Protein: 6.1 g/dL (ref 6.0–8.5)
eGFR: 66 mL/min/{1.73_m2} (ref >59)

## 2022-04-10 LAB — LIPID PANEL W/O CHOL/HDL RATIO
Cholesterol, Total: 172 mg/dL (ref 100–199)
HDL: 48 mg/dL (ref >39)
LDL Chol Calc (NIH): 110 mg/dL — ABNORMAL HIGH (ref 0–99)
Triglycerides: 71 mg/dL (ref 0–149)
VLDL Cholesterol Cal: 14 mg/dL (ref 5–40)

## 2022-04-10 LAB — URIC ACID: Uric Acid: 4.4 mg/dL (ref 3.8–8.4)

## 2022-04-19 DIAGNOSIS — R059 Cough, unspecified: Secondary | ICD-10-CM | POA: Diagnosis not present

## 2022-04-19 DIAGNOSIS — J45909 Unspecified asthma, uncomplicated: Secondary | ICD-10-CM | POA: Diagnosis not present

## 2022-04-19 DIAGNOSIS — J449 Chronic obstructive pulmonary disease, unspecified: Secondary | ICD-10-CM | POA: Diagnosis not present

## 2022-04-19 DIAGNOSIS — R0602 Shortness of breath: Secondary | ICD-10-CM | POA: Diagnosis not present

## 2022-04-19 DIAGNOSIS — J189 Pneumonia, unspecified organism: Secondary | ICD-10-CM | POA: Diagnosis not present

## 2022-04-23 ENCOUNTER — Ambulatory Visit: Payer: BC Managed Care – PPO | Admitting: Family Medicine

## 2022-04-23 ENCOUNTER — Encounter: Payer: Self-pay | Admitting: Family Medicine

## 2022-04-23 VITALS — BP 115/60 | HR 67 | Temp 98.7°F | Ht 68.0 in | Wt 193.5 lb

## 2022-04-23 DIAGNOSIS — J189 Pneumonia, unspecified organism: Secondary | ICD-10-CM

## 2022-04-23 NOTE — Progress Notes (Signed)
BP 115/60   Pulse 67   Temp 98.7 F (37.1 C) (Oral)   Ht '5\' 8"'$  (1.727 m)   Wt 193 lb 8 oz (87.8 kg)   SpO2 97%   BMI 29.42 kg/m    Subjective:    Patient ID: Daniel Cook, male    DOB: December 02, 1955, 67 y.o.   MRN: [id number]  HPI: Daniel Cook is a 67 y.o. male  Chief Complaint  Patient presents with   Pneumonia    Patient was he was recently seen at [facility] last Thursday and was diagnosed with Pneumonia. Patient is currently still taking medication.    ER FOLLOW UP Time since discharge: 5 days Hospital/facility: [facility] Diagnosis: Pneumonia Procedures/tests: CXR Consultants: None New medications: doxycycline and augmentin Discharge instructions:  Follow up here  Status: better  Had been sick for about 2 weeks. He has been tired. He has been coughing up phlegm and blood. No fevers. + chills, PND, rhinorrhea, SOB, wheezing.   Relevant past medical, surgical, family and social history reviewed and updated as indicated. Interim medical history since our last visit reviewed. Allergies and medications reviewed and updated.  Review of Systems  Constitutional: Negative.   HENT:  Positive for congestion, postnasal drip and rhinorrhea. Negative for dental problem, drooling, ear discharge, ear pain, facial swelling, hearing loss, mouth sores, nosebleeds, sinus pressure, sinus pain, sneezing, sore throat, tinnitus, trouble swallowing and voice change.   Respiratory:  Positive for cough, shortness of breath and wheezing. Negative for apnea, choking, chest tightness and stridor.   Cardiovascular: Negative.   Gastrointestinal: Negative.   Musculoskeletal: Negative.   Psychiatric/Behavioral: Negative.      Per HPI unless specifically indicated above     Objective:    BP 115/60   Pulse 67   Temp 98.7 F (37.1 C) (Oral)   Ht '5\' 8"'$  (1.727 m)   Wt 193 lb 8 oz (87.8 kg)   SpO2 97%   BMI 29.42 kg/m   Wt Readings from Last 3 Encounters:  04/23/22 193 lb 8 oz (87.8  kg)  04/09/22 201 lb 9.6 oz (91.4 kg)  01/08/22 202 lb 8 oz (91.9 kg)    Physical Exam Vitals and nursing note reviewed.  Constitutional:      General: He is not in acute distress.    Appearance: Normal appearance. He is not ill-appearing, toxic-appearing or diaphoretic.  HENT:     Head: Normocephalic and atraumatic.     Right Ear: External ear normal.     Left Ear: External ear normal.     Nose: Nose normal.     Mouth/Throat:     Mouth: Mucous membranes are moist.     Pharynx: Oropharynx is clear.  Eyes:     General: No scleral icterus.       Right eye: No discharge.        Left eye: No discharge.     Extraocular Movements: Extraocular movements intact.     Conjunctiva/sclera: Conjunctivae normal.     Pupils: Pupils are equal, round, and reactive to light.  Cardiovascular:     Rate and Rhythm: Normal rate and regular rhythm.     Pulses: Normal pulses.     Heart sounds: Normal heart sounds. No murmur heard.    No friction rub. No gallop.  Pulmonary:     Effort: Pulmonary effort is normal. No respiratory distress.     Breath sounds: Normal breath sounds. No stridor. No wheezing, rhonchi or rales.  Chest:  Chest wall: No tenderness.  Musculoskeletal:        General: Normal range of motion.     Cervical back: Normal range of motion and neck supple.  Skin:    General: Skin is warm and dry.     Capillary Refill: Capillary refill takes less than 2 seconds.     Coloration: Skin is not jaundiced or pale.     Findings: No bruising, erythema, lesion or rash.  Neurological:     General: No focal deficit present.     Mental Status: He is alert and oriented to person, place, and time. Mental status is at baseline.  Psychiatric:        Mood and Affect: Mood normal.        Behavior: Behavior normal.        Thought Content: Thought content normal.        Judgment: Judgment normal.     Results for orders placed or performed in visit on 04/09/22  Comprehensive metabolic panel   Result Value Ref Range   Glucose 112 (H) 70 - 99 mg/dL   BUN 23 8 - 27 mg/dL   Creatinine, Ser 1.22 0.76 - 1.27 mg/dL   eGFR 66 >59 mL/min/1.73   BUN/Creatinine Ratio 19 10 - 24   Sodium 142 134 - 144 mmol/L   Potassium 4.0 3.5 - 5.2 mmol/L   Chloride 100 96 - 106 mmol/L   CO2 26 20 - 29 mmol/L   Calcium 8.7 8.6 - 10.2 mg/dL   Total Protein 6.1 6.0 - 8.5 g/dL   Albumin 4.0 3.9 - 4.9 g/dL   Globulin, Total 2.1 1.5 - 4.5 g/dL   Albumin/Globulin Ratio 1.9 1.2 - 2.2   Bilirubin Total 0.3 0.0 - 1.2 mg/dL   Alkaline Phosphatase 59 44 - 121 IU/L   AST 26 0 - 40 IU/L   ALT 26 0 - 44 IU/L  CBC with Differential/Platelet  Result Value Ref Range   WBC 4.8 3.4 - 10.8 x10E3/uL   RBC 4.05 (L) 4.14 - 5.80 x10E6/uL   Hemoglobin 12.6 (L) 13.0 - 17.7 g/dL   Hematocrit 38.4 37.5 - 51.0 %   MCV 95 79 - 97 fL   MCH 31.1 26.6 - 33.0 pg   MCHC 32.8 31.5 - 35.7 g/dL   RDW 13.2 11.6 - 15.4 %   Platelets 206 150 - 450 x10E3/uL   Neutrophils 54 Not Estab. %   Lymphs 30 Not Estab. %   Monocytes 10 Not Estab. %   Eos 5 Not Estab. %   Basos 1 Not Estab. %   Neutrophils Absolute 2.6 1.4 - 7.0 x10E3/uL   Lymphocytes Absolute 1.4 0.7 - 3.1 x10E3/uL   Monocytes Absolute 0.5 0.1 - 0.9 x10E3/uL   EOS (ABSOLUTE) 0.2 0.0 - 0.4 x10E3/uL   Basophils Absolute 0.0 0.0 - 0.2 x10E3/uL   Immature Granulocytes 0 Not Estab. %   Immature Grans (Abs) 0.0 0.0 - 0.1 x10E3/uL  Bayer DCA Hb A1c Waived  Result Value Ref Range   HB A1C (BAYER DCA - WAIVED) 5.9 (H) 4.8 - 5.6 %  Lipid Panel w/o Chol/HDL Ratio  Result Value Ref Range   Cholesterol, Total 172 100 - 199 mg/dL   Triglycerides 71 0 - 149 mg/dL   HDL 48 >39 mg/dL   VLDL Cholesterol Cal 14 5 - 40 mg/dL   LDL Chol Calc (NIH) 110 (H) 0 - 99 mg/dL  Uric acid  Result Value Ref Range   Uric  Acid 4.4 3.8 - 8.4 mg/dL  Microalbumin, Urine Waived  Result Value Ref Range   Microalb, Ur Waived 80 (H) 0 - 19 mg/L   Creatinine, Urine Waived 300 10 - 300 mg/dL    Microalb/Creat Ratio 30-300 (H) <30 mg/g      Assessment & Plan:   Problem List Items Addressed This Visit   None Visit Diagnoses     Pneumonia of right upper lobe due to infectious organism    -  Primary   Lungs clear today. Will finish abx and recheck in 2 weeks. Will repeat CXR in about 4 weeks. Call with any concerns.   Relevant Medications   amoxicillin-clavulanate (AUGMENTIN) 875-125 MG tablet   doxycycline (VIBRAMYCIN) 100 MG capsule   Fluticasone-Salmeterol 232-14 MCG/ACT AEPB        Follow up plan: Return in about 2 weeks (around 05/07/2022).

## 2022-04-30 ENCOUNTER — Ambulatory Visit: Payer: BC Managed Care – PPO | Admitting: Family Medicine

## 2022-05-01 DIAGNOSIS — M5416 Radiculopathy, lumbar region: Secondary | ICD-10-CM | POA: Diagnosis not present

## 2022-05-01 DIAGNOSIS — M5126 Other intervertebral disc displacement, lumbar region: Secondary | ICD-10-CM | POA: Diagnosis not present

## 2022-05-01 DIAGNOSIS — M5136 Other intervertebral disc degeneration, lumbar region: Secondary | ICD-10-CM | POA: Diagnosis not present

## 2022-05-01 DIAGNOSIS — M5030 Other cervical disc degeneration, unspecified cervical region: Secondary | ICD-10-CM | POA: Diagnosis not present

## 2022-05-07 ENCOUNTER — Ambulatory Visit: Payer: BC Managed Care – PPO | Admitting: Family Medicine

## 2022-05-07 ENCOUNTER — Encounter: Payer: Self-pay | Admitting: Family Medicine

## 2022-05-07 VITALS — BP 119/70 | HR 43 | Temp 98.5°F | Ht 68.0 in | Wt 199.1 lb

## 2022-05-07 DIAGNOSIS — J189 Pneumonia, unspecified organism: Secondary | ICD-10-CM | POA: Diagnosis not present

## 2022-05-07 DIAGNOSIS — Z87891 Personal history of nicotine dependence: Secondary | ICD-10-CM

## 2022-05-07 DIAGNOSIS — J449 Chronic obstructive pulmonary disease, unspecified: Secondary | ICD-10-CM

## 2022-05-07 MED ORDER — BREZTRI AEROSPHERE 160-9-4.8 MCG/ACT IN AERO: 2.0000 | INHALATION_SPRAY | Freq: Two times a day (BID) | RESPIRATORY_TRACT | 0 refills | Status: DC

## 2022-05-07 NOTE — Assessment & Plan Note (Signed)
Will get him set up with low dose CT scan. Await results. Treat as needed.

## 2022-05-07 NOTE — Assessment & Plan Note (Signed)
Will treat with breztri for 1 month then change back to symbicort. Call if not getting better or getting worse.

## 2022-05-07 NOTE — Progress Notes (Signed)
BP 119/70   Pulse (!) 43   Temp 98.5 F (36.9 C) (Oral)   Ht '5\' 8"'$  (1.727 m)   Wt 199 lb 1.6 oz (90.3 kg)   SpO2 96%   BMI 30.27 kg/m    Subjective:    Patient ID: Daniel Cook, male    DOB: 06-12-1955, 67 y.o.   MRN: [id number]  HPI: Daniel Cook is a 67 y.o. male  Chief Complaint  Patient presents with   Pneumonia   Still coughing a bit. No fevers. No chills or sweats. Mild SOB, much better. Feeling about 60% better. No other concerns or complaints at this time.   Relevant past medical, surgical, family and social history reviewed and updated as indicated. Interim medical history since our last visit reviewed. Allergies and medications reviewed and updated.  Review of Systems  Constitutional: Negative.   Respiratory:  Positive for cough, shortness of breath and wheezing. Negative for apnea, choking, chest tightness and stridor.   Cardiovascular: Negative.   Gastrointestinal: Negative.   Musculoskeletal: Negative.   Psychiatric/Behavioral: Negative.      Per HPI unless specifically indicated above     Objective:    BP 119/70   Pulse (!) 43   Temp 98.5 F (36.9 C) (Oral)   Ht '5\' 8"'$  (1.727 m)   Wt 199 lb 1.6 oz (90.3 kg)   SpO2 96%   BMI 30.27 kg/m   Wt Readings from Last 3 Encounters:  05/07/22 199 lb 1.6 oz (90.3 kg)  04/23/22 193 lb 8 oz (87.8 kg)  04/09/22 201 lb 9.6 oz (91.4 kg)    Physical Exam Vitals and nursing note reviewed.  Constitutional:      General: He is not in acute distress.    Appearance: Normal appearance. He is normal weight. He is not ill-appearing, toxic-appearing or diaphoretic.  HENT:     Head: Normocephalic and atraumatic.     Right Ear: External ear normal.     Left Ear: External ear normal.     Nose: Nose normal.     Mouth/Throat:     Mouth: Mucous membranes are moist.     Pharynx: Oropharynx is clear.  Eyes:     General: No scleral icterus.       Right eye: No discharge.        Left eye: No discharge.      Extraocular Movements: Extraocular movements intact.     Conjunctiva/sclera: Conjunctivae normal.     Pupils: Pupils are equal, round, and reactive to light.  Cardiovascular:     Rate and Rhythm: Normal rate and regular rhythm.     Pulses: Normal pulses.     Heart sounds: Normal heart sounds. No murmur heard.    No friction rub. No gallop.  Pulmonary:     Effort: Pulmonary effort is normal. No respiratory distress.     Breath sounds: No stridor. Wheezing present. No rhonchi or rales.  Chest:     Chest wall: No tenderness.  Musculoskeletal:        General: Normal range of motion.     Cervical back: Normal range of motion and neck supple.  Skin:    General: Skin is warm and dry.     Capillary Refill: Capillary refill takes less than 2 seconds.     Coloration: Skin is not jaundiced or pale.     Findings: No bruising, erythema, lesion or rash.  Neurological:     General: No focal deficit present.  Mental Status: He is alert and oriented to person, place, and time. Mental status is at baseline.  Psychiatric:        Mood and Affect: Mood normal.        Behavior: Behavior normal.        Thought Content: Thought content normal.        Judgment: Judgment normal.     Results for orders placed or performed in visit on 04/09/22  Comprehensive metabolic panel  Result Value Ref Range   Glucose 112 (H) 70 - 99 mg/dL   BUN 23 8 - 27 mg/dL   Creatinine, Ser 1.22 0.76 - 1.27 mg/dL   eGFR 66 >59 mL/min/1.73   BUN/Creatinine Ratio 19 10 - 24   Sodium 142 134 - 144 mmol/L   Potassium 4.0 3.5 - 5.2 mmol/L   Chloride 100 96 - 106 mmol/L   CO2 26 20 - 29 mmol/L   Calcium 8.7 8.6 - 10.2 mg/dL   Total Protein 6.1 6.0 - 8.5 g/dL   Albumin 4.0 3.9 - 4.9 g/dL   Globulin, Total 2.1 1.5 - 4.5 g/dL   Albumin/Globulin Ratio 1.9 1.2 - 2.2   Bilirubin Total 0.3 0.0 - 1.2 mg/dL   Alkaline Phosphatase 59 44 - 121 IU/L   AST 26 0 - 40 IU/L   ALT 26 0 - 44 IU/L  CBC with Differential/Platelet   Result Value Ref Range   WBC 4.8 3.4 - 10.8 x10E3/uL   RBC 4.05 (L) 4.14 - 5.80 x10E6/uL   Hemoglobin 12.6 (L) 13.0 - 17.7 g/dL   Hematocrit 38.4 37.5 - 51.0 %   MCV 95 79 - 97 fL   MCH 31.1 26.6 - 33.0 pg   MCHC 32.8 31.5 - 35.7 g/dL   RDW 13.2 11.6 - 15.4 %   Platelets 206 150 - 450 x10E3/uL   Neutrophils 54 Not Estab. %   Lymphs 30 Not Estab. %   Monocytes 10 Not Estab. %   Eos 5 Not Estab. %   Basos 1 Not Estab. %   Neutrophils Absolute 2.6 1.4 - 7.0 x10E3/uL   Lymphocytes Absolute 1.4 0.7 - 3.1 x10E3/uL   Monocytes Absolute 0.5 0.1 - 0.9 x10E3/uL   EOS (ABSOLUTE) 0.2 0.0 - 0.4 x10E3/uL   Basophils Absolute 0.0 0.0 - 0.2 x10E3/uL   Immature Granulocytes 0 Not Estab. %   Immature Grans (Abs) 0.0 0.0 - 0.1 x10E3/uL  Bayer DCA Hb A1c Waived  Result Value Ref Range   HB A1C (BAYER DCA - WAIVED) 5.9 (H) 4.8 - 5.6 %  Lipid Panel w/o Chol/HDL Ratio  Result Value Ref Range   Cholesterol, Total 172 100 - 199 mg/dL   Triglycerides 71 0 - 149 mg/dL   HDL 48 >39 mg/dL   VLDL Cholesterol Cal 14 5 - 40 mg/dL   LDL Chol Calc (NIH) 110 (H) 0 - 99 mg/dL  Uric acid  Result Value Ref Range   Uric Acid 4.4 3.8 - 8.4 mg/dL  Microalbumin, Urine Waived  Result Value Ref Range   Microalb, Ur Waived 80 (H) 0 - 19 mg/L   Creatinine, Urine Waived 300 10 - 300 mg/dL   Microalb/Creat Ratio 30-300 (H) <30 mg/g      Assessment & Plan:   Problem List Items Addressed This Visit       Respiratory   COPD (chronic obstructive pulmonary disease) ([place])    Will treat with breztri for 1 month then change back to symbicort. Call if  not getting better or getting worse.       Relevant Medications   Budeson-Glycopyrrol-Formoterol (BREZTRI AEROSPHERE) 160-9-4.8 MCG/ACT AERO     Other   Former smoker - Primary    Will get him set up with low dose CT scan. Await results. Treat as needed.       Relevant Orders   CT CHEST LUNG CA SCREEN LOW DOSE W/O CM   Other Visit Diagnoses     Pneumonia of  right upper lobe due to infectious organism       Resolved. Continue to monitor. Call with any concerns.   Relevant Medications   Budeson-Glycopyrrol-Formoterol (BREZTRI AEROSPHERE) 160-9-4.8 MCG/ACT AERO        Follow up plan: Return As scheduled.

## 2022-05-29 ENCOUNTER — Telehealth: Payer: Self-pay

## 2022-05-29 NOTE — Telephone Encounter (Signed)
Copied from [place] [id number]. Topic: General - Other >> May 29, 2022  9:13 AM [place] B wrote: Reason for CRM: Jeani Hawking from [employer] called in says faxed info over for cpap supplies on Feb 13 and Feb 16. Please call back if received and stattus

## 2022-05-29 NOTE — Telephone Encounter (Signed)
Spoke with Daniel Cook to notify them that the faxed/order has been signed and faxed back over to them for the patient. Representative verbalized they have received the signed order for the patient.

## 2022-05-29 NOTE — Telephone Encounter (Unsigned)
Copied from [place] [phone]. Topic: General - Other >> May 29, 2022  9:13 AM [place] B wrote: Reason for CRM: Daniel Cook from [employer] called in says faxed info over for cpap supplies on Feb 13 and Feb 16. Please call back if received and stattus

## 2022-06-19 ENCOUNTER — Telehealth: Payer: Self-pay | Admitting: Family Medicine

## 2022-06-19 NOTE — Telephone Encounter (Unsigned)
Copied from [place] [phone]. Topic: General - Other >> Jun 19, 2022 10:02 AM Leone Payor F wrote: Reason for CRM: June with [facility] is calling in on behalf of the pt. June says she needs Dr. Wynetta Emery to sign a prescription request for CPAP supplies for the pt. June says she will resend the fax request.

## 2022-06-20 NOTE — Telephone Encounter (Signed)
Spoke with [facility] to inform them patient has not had a up to date sleep study as they had issues with getting in contact with patient to get scheduled. Per Dr Wynetta Emery, patient will need an appointment for updated sleep study before signing the request prescription for CPAP supplies. Representative says they will reach out to the patient and attempt to get scheduled.

## 2022-06-24 DIAGNOSIS — G4733 Obstructive sleep apnea (adult) (pediatric): Secondary | ICD-10-CM | POA: Diagnosis not present

## 2022-06-25 ENCOUNTER — Other Ambulatory Visit: Payer: Self-pay | Admitting: Family Medicine

## 2022-06-26 NOTE — Telephone Encounter (Signed)
Requested Prescriptions  Pending Prescriptions Disp Refills   amLODipine (NORVASC) 2.5 MG tablet [Pharmacy Med Name: AMLODIPINE BESYLATE 2.5 MG TAB] 90 tablet 1    Sig: TAKE 1 TABLET BY MOUTH DAILY     Cardiovascular: Calcium Channel Blockers 2 Failed - 06/25/2022  5:35 PM      Failed - Last Heart Rate in normal range    Pulse Readings from Last 1 Encounters:  05/07/22 (!) 43         Passed - Last BP in normal range    BP Readings from Last 1 Encounters:  05/07/22 119/70         Passed - Valid encounter within last 6 months    Recent Outpatient Visits           1 month ago Former smoker   [place], [place], DO   2 months ago Pneumonia of right upper lobe due to infectious organism   [place], Megan P, DO   2 months ago High cholesterol   [place], Megan P, DO   6 months ago COPD exacerbation [facility])   [place], Megan P, DO   8 months ago Controlled type 2 diabetes mellitus with complication, without long-term current use of insulin ([place])   [place], Barb Merino, DO       Future Appointments             In 3 months Wynetta Emery, Barb Merino, DO [place], [facility]

## 2022-06-28 ENCOUNTER — Other Ambulatory Visit: Payer: Self-pay | Admitting: Family Medicine

## 2022-06-28 DIAGNOSIS — E785 Hyperlipidemia, unspecified: Secondary | ICD-10-CM

## 2022-06-28 NOTE — Telephone Encounter (Signed)
Requested Prescriptions  Pending Prescriptions Disp Refills   atorvastatin (LIPITOR) 80 MG tablet [Pharmacy Med Name: ATORVASTATIN CALCIUM 80 MG TAB] 90 tablet 0    Sig: TAKE ONE TABLET BY MOUTH EVERY DAY     Cardiovascular:  Antilipid - Statins Failed - 06/28/2022  8:46 AM      Failed - Lipid Panel in normal range within the last 12 months    Cholesterol, Total  Date Value Ref Range Status  04/09/2022 172 100 - 199 mg/dL Final   Cholesterol Piccolo, Waived  Date Value Ref Range Status  09/22/2018 212 (H) <200 mg/dL Final    Comment:                            Desirable                <200                         Borderline High      200- 239                         High                     >239    LDL Chol Calc (NIH)  Date Value Ref Range Status  04/09/2022 110 (H) 0 - 99 mg/dL Final   HDL  Date Value Ref Range Status  04/09/2022 48 >39 mg/dL Final   Triglycerides  Date Value Ref Range Status  04/09/2022 71 0 - 149 mg/dL Final   Triglycerides Piccolo,Waived  Date Value Ref Range Status  09/22/2018 192 (H) <150 mg/dL Final    Comment:                            Normal                   <150                         Borderline High     150 - 199                         High                200 - 499                         Very High                >499          Passed - Patient is not pregnant      Passed - Valid encounter within last 12 months    Recent Outpatient Visits           1 month ago Former smoker   [place], Megan P, DO   2 months ago Pneumonia of right upper lobe due to infectious organism   [place] P, DO   2 months ago High cholesterol   [place], Megan P, DO   6 months ago COPD exacerbation [facility])   [place], Megan P, DO   8 months ago Controlled  type 2 diabetes mellitus with complication, without  long-term current use of insulin ([place])   [place], DO       Future Appointments             In 3 months [place], Megan P, DO [place], [facility]             loratadine (CLARITIN) 10 MG tablet [employer] Med Name: LORATADINE 10 MG TAB] 90 tablet 0    Sig: TAKE 1 TABLET BY MOUTH DAILY     Ear, Nose, and Throat:  Antihistamines 2 Passed - 06/28/2022  8:46 AM      Passed - Cr in normal range and within 360 days    Creatinine, Ser  Date Value Ref Range Status  04/09/2022 1.22 0.76 - 1.27 mg/dL Final         Passed - Valid encounter within last 12 months    Recent Outpatient Visits           1 month ago Former smoker   [place], Megan P, DO   2 months ago Pneumonia of right upper lobe due to infectious organism   [place], Megan P, DO   2 months ago High cholesterol   [place], Megan P, DO   6 months ago COPD exacerbation [facility])   [place], Megan P, DO   8 months ago Controlled type 2 diabetes mellitus with complication, without long-term current use of insulin ([place])   [place], Barb Merino, DO       Future Appointments             In 3 months Wynetta Emery, Barb Merino, DO [place], [facility]

## 2022-07-12 ENCOUNTER — Telehealth: Payer: Self-pay | Admitting: Family Medicine

## 2022-07-12 NOTE — Telephone Encounter (Signed)
Spoke with patient and made him aware that Dr Laural Benes has placed two sample up front for him to pick up. Patient verbalized understanding and says that he would not be able to pick the samples up today, but he can stop and pick them up Tuesday. Patient verbalized understanding.

## 2022-07-12 NOTE — Telephone Encounter (Signed)
Copied from CRM [id number]. Topic: General - Other >> Jul 12, 2022 11:54 AM Daniel Cook wrote: Reason for CRM: The patient would like to know if there are any samples of Budeson-Glycopyrrol-Formoterol (BREZTRI AEROSPHERE) 160-9-4.8 MCG/ACT AERO [[id number]] available for them to pick up   Please contact the patient further when possible

## 2022-07-12 NOTE — Telephone Encounter (Signed)
I do. 2 signed out and up front for him to pick up

## 2022-07-16 NOTE — Telephone Encounter (Signed)
Pt came by and picked up the two boxes of the inhalers.

## 2022-07-17 ENCOUNTER — Telehealth: Payer: Self-pay | Admitting: Cardiovascular Disease

## 2022-07-17 ENCOUNTER — Other Ambulatory Visit: Payer: Self-pay

## 2022-07-17 MED ORDER — ICOSAPENT ETHYL 1 G PO CAPS: 2.0000 g | ORAL_CAPSULE | Freq: Two times a day (BID) | ORAL | 0 refills | Status: DC

## 2022-07-17 NOTE — Telephone Encounter (Signed)
Patient called to have icosapent Ethyl (VASCEPA) 1 g capsule refilled to mail order pharmacy. Rx sent to pharmacy of choice

## 2022-07-17 NOTE — Telephone Encounter (Signed)
Pt c/o medication issue:  1. Name of Medication:   icosapent Ethyl (VASCEPA) 1 g capsule    2. How are you currently taking this medication (dosage and times per day)? Take 2 capsules (2 g total) by mouth 2 (two) times daily.   3. Are you having a reaction (difficulty breathing--STAT)? No  4. What is your medication issue? Pt would like a callback regarding medication and how he needs to go about getting refills being that he does not go through pharmacy on file.

## 2022-07-17 NOTE — Telephone Encounter (Signed)
Patient called to have icosapent Ethyl (VASCEPA) 1 g capsule refilled to mail order pharmacy. Rx sent to pharmacy of choice 

## 2022-07-25 DIAGNOSIS — G4733 Obstructive sleep apnea (adult) (pediatric): Secondary | ICD-10-CM | POA: Diagnosis not present

## 2022-07-30 DIAGNOSIS — M5136 Other intervertebral disc degeneration, lumbar region: Secondary | ICD-10-CM | POA: Diagnosis not present

## 2022-07-30 DIAGNOSIS — M5416 Radiculopathy, lumbar region: Secondary | ICD-10-CM | POA: Diagnosis not present

## 2022-07-30 DIAGNOSIS — M47816 Spondylosis without myelopathy or radiculopathy, lumbar region: Secondary | ICD-10-CM | POA: Diagnosis not present

## 2022-08-10 NOTE — Progress Notes (Unsigned)
Cardiology Office Note  Date:  08/12/2022   ID:  REINALD TORELLO, DOB 1956-04-06, MRN [id number]  PCP:  Dorcas Carrow, DO   Chief Complaint  Patient presents with   chest pain     Patient c/o chest pain & left shoulder tingling that radiates down his left am. Patient just finished prednisone this am, was taking for his sciatica Medications reviewed by the patient verbally.     HPI:  Mr. 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[place], concrete floor, sits on stool Thinking of retiring next year  Denies shortness of breath or chest pain on exertion  Lab work reviewed A1C 5.7 Total chol 170  Chronic low heart rate, asymptomatic Rates typically in the 40s Previous seen by EP  EKG personally reviewed by myself on todays visit Sinus bradycardia rate 44 bpm no significant ST-T wave changes  Other past medical history reviewed Stress test was previously ordered for atypical chest pain Did not go through with a stress test, was too expensive Could not afford cardiac CTA  Had COVID February 2022, severe symptoms,   ZIO previously performed showing brief episodes of nonsustained atrial tachycardia  Echo March 2022 Normal LV function, normal RV function, no significant valve disease   PMH:   has a past medical history of COPD (chronic obstructive pulmonary disease) (HCC), COVID-19 (12/2019), Gout, High cholesterol, Hypertension, Obesity, Sleep apnea, and Vertigo.  PSH:    Past Surgical  History:  Procedure Laterality Date   COLONOSCOPY WITH PROPOFOL N/A 10/06/2020   Procedure: COLONOSCOPY WITH PROPOFOL;  Surgeon: Midge Minium, MD;  [facility];  Service: Endoscopy;  Laterality: N/A;   ESOPHAGOGASTRODUODENOSCOPY (EGD) WITH PROPOFOL N/A 10/06/2020   Procedure: ESOPHAGOGASTRODUODENOSCOPY (EGD) WITH PROPOFOL;  Surgeon: Midge Minium, MD;  Location: [facility];  Service: Endoscopy;  Laterality: N/A;   knee surgeries      Current Outpatient Medications  Medication Sig Dispense Refill   albuterol (VENTOLIN HFA) 108 (90 Base) MCG/ACT inhaler INHALE 1-2 PUFFS INTO THE LUNGS EVERY 6 HOURS AS NEEDED FOR WHEEZING/ SHORTNESS OF BREATH 8.5 g 2   allopurinol (ZYLOPRIM) 300 MG tablet TAKE 1 TABLET BY MOUTH DAILY 90 tablet 1   amLODipine (NORVASC) 2.5 MG tablet TAKE 1 TABLET BY MOUTH DAILY 90 tablet 0   Ascorbic Acid (VITAMIN C) 1000 MG tablet Take 1,000 mg by mouth daily.     Aspirin 81 MG CAPS Take 81 mg by mouth daily.     atorvastatin (LIPITOR) 80 MG tablet TAKE ONE TABLET BY MOUTH EVERY DAY 90 tablet 0   Budeson-Glycopyrrol-Formoterol (BREZTRI AEROSPHERE) 160-9-4.8 MCG/ACT AERO Inhale 2 puffs into the lungs 2 (two) times daily. 10.7 g 0   budesonide-formoterol (SYMBICORT) 160-4.5 MCG/ACT inhaler Inhale 2 puffs into the lungs 2 (two) times daily. 3 each 3   Cholecalciferol (VITAMIN D3) 25 MCG (1000 UT) CAPS Take 1 capsule by mouth daily.     colchicine 0.6 MG tablet Take 1 tablet (0.6 mg total) by mouth daily as needed. 90 tablet 1  ezetimibe (ZETIA) 10 MG tablet Take 1 tablet (10 mg total) by mouth daily. 90 tablet 1   Fluticasone-Salmeterol 232-14 MCG/ACT AEPB Inhale into the lungs.     gabapentin (NEURONTIN) 300 MG capsule Take 2 capsules (600 mg total) by mouth 3 (three) times daily. 540 capsule 1   Glucosamine-Chondroit-Biofl-Mn (GLUCOSAMINE CHONDROIT,BIOFLAV, PO) Take 1 capsule by mouth daily.     hydrochlorothiazide (HYDRODIURIL) 12.5 MG tablet TAKE 1  TABLET BY MOUTH DAILY 90 tablet 1   HYDROcodone-acetaminophen (NORCO/VICODIN) 5-325 MG tablet 1 po tid prn 15 tablet 0   icosapent Ethyl (VASCEPA) 1 g capsule Take 2 capsules (2 g total) by mouth 2 (two) times daily. 360 capsule 0   loratadine (CLARITIN) 10 MG tablet TAKE 1 TABLET BY MOUTH DAILY 90 tablet 0   losartan (COZAAR) 100 MG tablet TAKE 1 TABLET BY MOUTH DAILY 90 tablet 1   magnesium gluconate (MAGONATE) 500 MG tablet Take 500 mg by mouth daily.     naproxen (NAPROSYN) 500 MG tablet TAKE ONE TABLET TWICE DAILY WITH MEALS 180 tablet 1   Omega-3 Fatty Acids (FISH OIL) 1200 MG CAPS Take 1 capsule by mouth daily.     omeprazole (PRILOSEC) 20 MG capsule Take 1 capsule (20 mg total) by mouth daily. 90 capsule 1   ondansetron (ZOFRAN) 4 MG tablet Take 1 tablet (4 mg total) by mouth every 8 (eight) hours as needed for nausea or vomiting. 30 tablet 0   rosuvastatin (CRESTOR) 5 MG tablet Take 1 tablet (5 mg total) by mouth 3 (three) times a week. 39 tablet 3   simethicone (MYLICON) 40 MG/0.6ML drops Take 0.6 mLs (40 mg total) by mouth 4 (four) times daily as needed for flatulence. 120 mL 1   tiZANidine (ZANAFLEX) 4 MG tablet Take 4 mg by mouth as needed.     Vitamin E 134 MG (200 UNIT) TABS Take 1 tablet by mouth daily.     Zinc 50 MG CAPS Take 1 capsule by mouth daily.     No current facility-administered medications for this visit.    Allergies:   Patient has no known allergies.   Social History:  The patient  reports that he quit smoking about 23 years ago. His smoking use included cigarettes. He has a 40.00 pack-year smoking history. His smokeless tobacco use includes snuff. He reports current alcohol use. He reports that he does not use drugs.   Family History:   family history includes Heart attack (age of onset: 48) in his father; Heart disease in his father; Hyperlipidemia in his father; Hypertension in his father.   Review of Systems: Review of Systems  Constitutional: Negative.    HENT: Negative.    Respiratory: Negative.    Cardiovascular: Negative.   Gastrointestinal: Negative.   Musculoskeletal: Negative.   Neurological: Negative.   Psychiatric/Behavioral: Negative.    All other systems reviewed and are negative.  PHYSICAL EXAM: VS:  BP (!) 144/82 (BP Location: Left Arm, Patient Position: Sitting, Cuff Size: Normal)   Pulse (!) 44   Ht 5\' 8"  (1.727 m)   Wt 207 lb 4 oz (94 kg)   SpO2 98%   BMI 31.51 kg/m  , BMI Body mass index is 31.51 kg/m. Constitutional:  oriented to person, place, and time. No distress.  HENT:  Head: Grossly normal Eyes:  no discharge. No scleral icterus.  Neck: No JVD, no carotid bruits  Cardiovascular: Regular rate and rhythm, no murmurs appreciated Pulmonary/Chest: Clear to auscultation bilaterally, no wheezes or  rails Abdominal: Soft.  no distension.  no tenderness.  Musculoskeletal: Normal range of motion Neurological:  normal muscle tone. Coordination normal. No atrophy Skin: Skin warm and dry Psychiatric: normal affect, pleasant  Recent Labs: 08/24/2021: TSH 1.340 04/09/2022: ALT 26; BUN 23; Creatinine, Ser 1.22; Hemoglobin 12.6; Platelets 206; Potassium 4.0; Sodium 142    Lipid Panel Lab Results  Component Value Date   CHOL 172 04/09/2022   HDL 48 04/09/2022   LDLCALC 110 (H) 04/09/2022   TRIG 71 04/09/2022     Wt Readings from Last 3 Encounters:  08/12/22 207 lb 4 oz (94 kg)  05/07/22 199 lb 1.6 oz (90.3 kg)  04/23/22 193 lb 8 oz (87.8 kg)     ASSESSMENT AND PLAN:  Problem List Items Addressed This Visit       Cardiology Problems   High cholesterol   Hypertension   Aortic atherosclerosis (HCC) - Primary     Other   Former smoker   Controlled type 2 diabetes mellitus with complication, without long-term current use of insulin (HCC)   Bradycardia   Smokeless tobacco use   Other Visit Diagnoses     Atrial tachycardia       OSA on CPAP       Hyperlipidemia LDL goal <70         Coronary  calcification Low calcium score 185 in 2022 Reports tolerating Lipitor 80 with Zetia Diet restriction recommended  Bradycardia asymptomatic, seen by EP,  No near syncope or syncope No indication for pacemaker Stable  Essential hypertension Blood pressure is well controlled on today's visit. No changes made to the medications.  Hyperlipidemia Reports taking Lipitor 80 with Zetia  Obstructive sleep apnea on CPAP Compliant with a CPAP   Total encounter time more than 30 minutes  Greater than 50% was spent in counseling and coordination of care with the patient  Signed, Dossie Arbour, M.D., Ph.D. [facility] [place], [place] [phone]

## 2022-08-12 ENCOUNTER — Encounter: Payer: Self-pay | Admitting: Cardiovascular Disease

## 2022-08-12 ENCOUNTER — Ambulatory Visit: Payer: BC Managed Care – PPO | Attending: Cardiovascular Disease | Admitting: Cardiovascular Disease

## 2022-08-12 VITALS — BP 144/82 | HR 44 | Ht 68.0 in | Wt 207.2 lb

## 2022-08-12 DIAGNOSIS — R001 Bradycardia, unspecified: Secondary | ICD-10-CM

## 2022-08-12 DIAGNOSIS — Z720 Tobacco use: Secondary | ICD-10-CM

## 2022-08-12 DIAGNOSIS — Z87891 Personal history of nicotine dependence: Secondary | ICD-10-CM

## 2022-08-12 DIAGNOSIS — I700 Atherosclerosis of aorta: Secondary | ICD-10-CM | POA: Diagnosis not present

## 2022-08-12 DIAGNOSIS — E785 Hyperlipidemia, unspecified: Secondary | ICD-10-CM

## 2022-08-12 DIAGNOSIS — E7800 Pure hypercholesterolemia, unspecified: Secondary | ICD-10-CM

## 2022-08-12 DIAGNOSIS — E118 Type 2 diabetes mellitus with unspecified complications: Secondary | ICD-10-CM | POA: Diagnosis not present

## 2022-08-12 DIAGNOSIS — I10 Essential (primary) hypertension: Secondary | ICD-10-CM

## 2022-08-12 DIAGNOSIS — I4719 Other supraventricular tachycardia: Secondary | ICD-10-CM

## 2022-08-12 DIAGNOSIS — G4733 Obstructive sleep apnea (adult) (pediatric): Secondary | ICD-10-CM

## 2022-08-12 DIAGNOSIS — Z7985 Long-term (current) use of injectable non-insulin antidiabetic drugs: Secondary | ICD-10-CM

## 2022-08-12 NOTE — Patient Instructions (Addendum)
Medication Instructions:  No changes  You do not need crestor  If you need a refill on your cardiac medications before your next appointment, please call your pharmacy.   Lab work: No new labs needed  Testing/Procedures: No new testing needed  Follow-Up: At [facility], you and your health needs are our priority.  As part of our continuing mission to provide you with exceptional heart care, we have created designated Provider Care Teams.  These Care Teams include your primary Cardiologist (physician) and Advanced Practice Providers (APPs -  Physician Assistants and Nurse Practitioners) who all work together to provide you with the care you need, when you need it.  You will need a follow up appointment in 12 months  Providers on your designated Care Team:   Nicolasa Ducking, NP Eula Listen, PA-C Cadence Fransico [place], [place]  COVID-19 Vaccine Information can be found at: [email or web address] For questions related to vaccine distribution or appointments, please email [email or web address] or call [phone].

## 2022-08-20 LAB — HM DIABETES EYE EXAM

## 2022-08-24 DIAGNOSIS — G4733 Obstructive sleep apnea (adult) (pediatric): Secondary | ICD-10-CM | POA: Diagnosis not present

## 2022-09-17 ENCOUNTER — Other Ambulatory Visit: Payer: Self-pay | Admitting: Family Medicine

## 2022-09-17 DIAGNOSIS — E785 Hyperlipidemia, unspecified: Secondary | ICD-10-CM

## 2022-09-18 NOTE — Telephone Encounter (Signed)
Requested Prescriptions  Pending Prescriptions Disp Refills   atorvastatin (LIPITOR) 80 MG tablet [Pharmacy Med Name: ATORVASTATIN CALCIUM 80 MG TAB] 90 tablet 0    Sig: TAKE ONE TABLET BY MOUTH EVERY DAY     Cardiovascular:  Antilipid - Statins Failed - 09/17/2022 10:07 AM      Failed - Lipid Panel in normal range within the last 12 months    Cholesterol, Total  Date Value Ref Range Status  04/09/2022 172 100 - 199 mg/dL Final   Cholesterol Piccolo, Waived  Date Value Ref Range Status  09/22/2018 212 (H) <200 mg/dL Final    Comment:                            Desirable                <200                         Borderline High      200- 239                         High                     >239    LDL Chol Calc (NIH)  Date Value Ref Range Status  04/09/2022 110 (H) 0 - 99 mg/dL Final   HDL  Date Value Ref Range Status  04/09/2022 48 >39 mg/dL Final   Triglycerides  Date Value Ref Range Status  04/09/2022 71 0 - 149 mg/dL Final   Triglycerides Piccolo,Waived  Date Value Ref Range Status  09/22/2018 192 (H) <150 mg/dL Final    Comment:                            Normal                   <150                         Borderline High     150 - 199                         High                200 - 499                         Very High                >499          Passed - Patient is not pregnant      Passed - Valid encounter within last 12 months    Recent Outpatient Visits           4 months ago Former smoker   [place] [facility], Megan P, DO   4 months ago Pneumonia of right upper lobe due to infectious organism   [place] [facility] [place], Megan P, DO   5 months ago High cholesterol   [place] [facility] [place], Megan P, DO   9 months ago COPD exacerbation [facility])   [place] [facility] [place], Megan P, DO   11 months ago Controlled type  2 diabetes mellitus with complication, without  long-term current use of insulin (HCC)   [place] [facility] [place], Megan P, DO               loratadine (CLARITIN) 10 MG tablet [Pharmacy Med Name: LORATADINE 10 MG TAB] 90 tablet 0    Sig: TAKE 1 TABLET BY MOUTH DAILY     Ear, Nose, and Throat:  Antihistamines 2 Passed - 09/17/2022 10:07 AM      Passed - Cr in normal range and within 360 days    Creatinine, Ser  Date Value Ref Range Status  04/09/2022 1.22 0.76 - 1.27 mg/dL Final         Passed - Valid encounter within last 12 months    Recent Outpatient Visits           4 months ago Former smoker   [place] [facility], Megan P, DO   4 months ago Pneumonia of right upper lobe due to infectious organism   [place] [facility] [place], Megan P, DO   5 months ago High cholesterol   [place] [facility] [place], Megan P, DO   9 months ago COPD exacerbation [facility])   [place] [facility], Megan P, DO   11 months ago Controlled type 2 diabetes mellitus with complication, without long-term current use of insulin [facility])   [place] [facility] [place], [place], DO

## 2022-09-19 DIAGNOSIS — G4733 Obstructive sleep apnea (adult) (pediatric): Secondary | ICD-10-CM | POA: Diagnosis not present

## 2022-09-27 ENCOUNTER — Other Ambulatory Visit: Payer: Self-pay | Admitting: Family Medicine

## 2022-09-27 NOTE — Telephone Encounter (Signed)
Requested Prescriptions  Pending Prescriptions Disp Refills   amLODipine (NORVASC) 2.5 MG tablet [Pharmacy Med Name: AMLODIPINE BESYLATE 2.5 MG TAB] 90 tablet 0    Sig: TAKE 1 TABLET BY MOUTH DAILY     Cardiovascular: Calcium Channel Blockers 2 Failed - 09/27/2022 12:18 PM      Failed - Last BP in normal range    BP Readings from Last 1 Encounters:  08/12/22 (!) 144/82         Failed - Last Heart Rate in normal range    Pulse Readings from Last 1 Encounters:  08/12/22 (!) 44         Passed - Valid encounter within last 6 months    Recent Outpatient Visits           4 months ago Former smoker   [place] [facility], [place], DO   5 months ago Pneumonia of right upper lobe due to infectious organism   [place] [facility] [place], Megan P, DO   5 months ago High cholesterol   [place] [facility] [place], Megan P, DO   9 months ago COPD exacerbation [facility])   [place] [facility] [place], Megan P, DO   11 months ago Controlled type 2 diabetes mellitus with complication, without long-term current use of insulin [facility])   [place] [facility] [place], [place], DO

## 2022-10-02 ENCOUNTER — Telehealth: Payer: Self-pay | Admitting: Family Medicine

## 2022-10-02 NOTE — Telephone Encounter (Signed)
I've got one here for him- I'll put it up front

## 2022-10-02 NOTE — Telephone Encounter (Signed)
Called patient to inform him that the sample was at the front desk.

## 2022-10-02 NOTE — Telephone Encounter (Signed)
Patient is calling in regards to requesting more samples of Breztri (inhaler). Patient stated Dr. Laural Benes has given him samples of this before and patient has ran out. Patients next appt is [facility] with Dr Laural Benes on 10/08/2022.   Patients callback #: [phone]

## 2022-10-02 NOTE — Telephone Encounter (Signed)
Routing to provider to advise.  

## 2022-10-08 ENCOUNTER — Encounter: Payer: Self-pay | Admitting: Family Medicine

## 2022-10-08 ENCOUNTER — Ambulatory Visit (INDEPENDENT_AMBULATORY_CARE_PROVIDER_SITE_OTHER): Payer: BC Managed Care – PPO | Admitting: Family Medicine

## 2022-10-08 VITALS — BP 137/61 | HR 38 | Temp 97.9°F | Ht 67.0 in | Wt 205.0 lb

## 2022-10-08 DIAGNOSIS — E7800 Pure hypercholesterolemia, unspecified: Secondary | ICD-10-CM

## 2022-10-08 DIAGNOSIS — E118 Type 2 diabetes mellitus with unspecified complications: Secondary | ICD-10-CM

## 2022-10-08 DIAGNOSIS — J449 Chronic obstructive pulmonary disease, unspecified: Secondary | ICD-10-CM

## 2022-10-08 DIAGNOSIS — R001 Bradycardia, unspecified: Secondary | ICD-10-CM

## 2022-10-08 DIAGNOSIS — E785 Hyperlipidemia, unspecified: Secondary | ICD-10-CM

## 2022-10-08 DIAGNOSIS — Z136 Encounter for screening for cardiovascular disorders: Secondary | ICD-10-CM

## 2022-10-08 DIAGNOSIS — I10 Essential (primary) hypertension: Secondary | ICD-10-CM

## 2022-10-08 DIAGNOSIS — Z87891 Personal history of nicotine dependence: Secondary | ICD-10-CM

## 2022-10-08 DIAGNOSIS — I700 Atherosclerosis of aorta: Secondary | ICD-10-CM | POA: Diagnosis not present

## 2022-10-08 DIAGNOSIS — Z125 Encounter for screening for malignant neoplasm of prostate: Secondary | ICD-10-CM | POA: Diagnosis not present

## 2022-10-08 DIAGNOSIS — Z0000 Encounter for general adult medical examination without abnormal findings: Secondary | ICD-10-CM | POA: Diagnosis not present

## 2022-10-08 DIAGNOSIS — M1000 Idiopathic gout, unspecified site: Secondary | ICD-10-CM | POA: Diagnosis not present

## 2022-10-08 DIAGNOSIS — Z23 Encounter for immunization: Secondary | ICD-10-CM

## 2022-10-08 DIAGNOSIS — M5136 Other intervertebral disc degeneration, lumbar region: Secondary | ICD-10-CM

## 2022-10-08 LAB — MICROSCOPIC EXAMINATION
Bacteria, UA: NONE SEEN
Epithelial Cells (non renal): NONE SEEN /hpf (ref 0–10)

## 2022-10-08 LAB — URINALYSIS, ROUTINE W REFLEX MICROSCOPIC
Bilirubin, UA: NEGATIVE
Glucose, UA: NEGATIVE
Leukocytes,UA: NEGATIVE
Nitrite, UA: NEGATIVE
RBC, UA: NEGATIVE
Specific Gravity, UA: 1.025 (ref 1.005–1.030)
Urobilinogen, Ur: 1 mg/dL (ref 0.2–1.0)
pH, UA: 6.5 (ref 5.0–7.5)

## 2022-10-08 LAB — MICROALBUMIN, URINE WAIVED
Creatinine, Urine Waived: 300 mg/dL (ref 10–300)
Microalb, Ur Waived: 80 mg/L — ABNORMAL HIGH (ref 0–19)

## 2022-10-08 LAB — BAYER DCA HB A1C WAIVED: HB A1C (BAYER DCA - WAIVED): 6.1 % — ABNORMAL HIGH (ref 4.8–5.6)

## 2022-10-08 MED ORDER — COLCHICINE 0.6 MG PO TABS: 0.6000 mg | ORAL_TABLET | Freq: Every day | ORAL | 1 refills | Status: DC | PRN

## 2022-10-08 MED ORDER — HYDROCHLOROTHIAZIDE 12.5 MG PO TABS: ORAL_TABLET | ORAL | 1 refills | Status: DC

## 2022-10-08 MED ORDER — AMLODIPINE BESYLATE 2.5 MG PO TABS: 2.5000 mg | ORAL_TABLET | Freq: Every day | ORAL | 1 refills | Status: DC

## 2022-10-08 MED ORDER — EZETIMIBE 10 MG PO TABS: 10.0000 mg | ORAL_TABLET | Freq: Every day | ORAL | 1 refills | Status: DC

## 2022-10-08 MED ORDER — ATORVASTATIN CALCIUM 80 MG PO TABS: 80.0000 mg | ORAL_TABLET | Freq: Every day | ORAL | 1 refills | Status: DC

## 2022-10-08 MED ORDER — ALLOPURINOL 300 MG PO TABS: 300.0000 mg | ORAL_TABLET | Freq: Every day | ORAL | 1 refills | Status: DC

## 2022-10-08 MED ORDER — BREZTRI AEROSPHERE 160-9-4.8 MCG/ACT IN AERO: 2.0000 | INHALATION_SPRAY | Freq: Two times a day (BID) | RESPIRATORY_TRACT | 12 refills | Status: DC

## 2022-10-08 MED ORDER — OMEPRAZOLE 20 MG PO CPDR: 20.0000 mg | DELAYED_RELEASE_CAPSULE | Freq: Every day | ORAL | 1 refills | Status: DC

## 2022-10-08 MED ORDER — LOSARTAN POTASSIUM 100 MG PO TABS: ORAL_TABLET | ORAL | 1 refills | Status: DC

## 2022-10-08 MED ORDER — ALBUTEROL SULFATE HFA 108 (90 BASE) MCG/ACT IN AERS: INHALATION_SPRAY | RESPIRATORY_TRACT | 6 refills | Status: DC

## 2022-10-08 MED ORDER — GABAPENTIN 300 MG PO CAPS: 600.0000 mg | ORAL_CAPSULE | Freq: Three times a day (TID) | ORAL | 1 refills | Status: DC

## 2022-10-08 MED ORDER — NAPROXEN 500 MG PO TABS: ORAL_TABLET | ORAL | 1 refills | Status: DC

## 2022-10-08 MED ORDER — LORATADINE 10 MG PO TABS: 10.0000 mg | ORAL_TABLET | Freq: Every day | ORAL | 1 refills | Status: DC

## 2022-10-08 NOTE — Progress Notes (Signed)
BP 137/61   Pulse (!) 38   Temp 97.9 F (36.6 C) (Oral)   Ht 5\' 7"  (1.702 m)   Wt 205 lb (93 kg)   SpO2 96%   BMI 32.11 kg/m    Subjective:    Patient ID: Daniel Cook, male    DOB: 1955/09/20, 67 y.o.   MRN: [id number]  HPI: Daniel Cook is a 67 y.o. male presenting on 10/08/2022 for comprehensive medical examination. Current medical complaints include:  DIABETES Hypoglycemic episodes:no Polydipsia/polyuria: no Visual disturbance: no Chest pain: no Paresthesias: no Glucose Monitoring: no Taking Insulin?: no Blood Pressure Monitoring: not checking Retinal Examination: Up to Date Foot Exam: Up to Date Diabetic Education: Completed Pneumovax: Up to Date Influenza: Up to Date Aspirin: no,  No gout flares, tolerating medicine well.   COPD COPD status: controlled Satisfied with current treatment?: no Oxygen use: no Dyspnea frequency: occasionally  Cough frequency: occasionally Rescue inhaler frequency:  rarely Limitation of activity: no Productive cough: no Pneumovax: Up to Date Influenza: Up to Date   HYPERTENSION / HYPERLIPIDEMIA Satisfied with current treatment? yes Duration of hypertension: chronic BP monitoring frequency: not checking BP medication side effects: no Past BP meds: amlodipine, losartan, HCTZ Duration of hyperlipidemia: chronic Cholesterol medication side effects: no Cholesterol supplements: fish oil Past cholesterol medications: zetia, atorvastatin Medication compliance: excellent compliance Aspirin: yes Recent stressors: no Recurrent headaches: no Visual changes: no Palpitations: no Dyspnea: no Chest pain: no Lower extremity edema: no Dizzy/lightheaded: no  Interim Problems from his last visit: no  Functional Status Survey: Is the patient deaf or have difficulty hearing?: No Does the patient have difficulty seeing, even when wearing glasses/contacts?: No Does the patient have difficulty concentrating, remembering, or  making decisions?: No Does the patient have difficulty walking or climbing stairs?: No Does the patient have difficulty dressing or bathing?: No Does the patient have difficulty doing errands alone such as visiting a doctor's office or shopping?: No  FALL RISK:    10/07/2022    8:37 PM 05/07/2022    2:57 PM 04/09/2022    8:24 AM 10/05/2021    8:50 AM 06/12/2021    8:13 AM  Fall Risk   Falls in the past year? 0 0 0 0 0  Number falls in past yr: 0 0 0 0 0  Injury with Fall? 0 0 0 0 0  Risk for fall due to :  No Fall Risks No Fall Risks No Fall Risks No Fall Risks  Follow up  Falls evaluation completed Falls evaluation completed Falls evaluation completed Falls evaluation completed    Depression Screen    10/08/2022    8:32 AM 05/07/2022    2:58 PM 04/23/2022    2:10 PM 04/09/2022    8:23 AM 08/24/2021    8:28 AM  Depression screen PHQ 2/9  Decreased Interest 0 0 0 0 2  Down, Depressed, Hopeless 0 0 0 0 2  PHQ - 2 Score 0 0 0 0 4  Altered sleeping 0 0 0 0 2  Tired, decreased energy 2 0 1 1 3   Change in appetite 0 0 0 0 0  Feeling bad or failure about yourself  0 0 0 0 0  Trouble concentrating 0 0 0 0 1  Moving slowly or fidgety/restless 0 0 0 0 0  Suicidal thoughts 0 0 0 0 0  PHQ-9 Score 2 0 1 1 10   Difficult doing work/chores Somewhat difficult Not difficult at all Not difficult at all  Not difficult at all     Advanced Directives Does patient have a HCPOA?    no If yes, name and contact information:  Does patient have a living will or MOST form?  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   Gout    High cholesterol    Hypertension    Obesity    Sleep apnea    CPAP   Vertigo     Surgical History:  Past Surgical History:  Procedure Laterality Date   COLONOSCOPY WITH PROPOFOL N/A 10/06/2020   Procedure: COLONOSCOPY WITH  PROPOFOL;  Surgeon: Midge Minium, MD;  Location: [facility];  Service: Endoscopy;  Laterality: N/A;   ESOPHAGOGASTRODUODENOSCOPY (EGD) WITH PROPOFOL N/A 10/06/2020   Procedure: ESOPHAGOGASTRODUODENOSCOPY (EGD) WITH PROPOFOL;  Surgeon: Midge Minium, MD;  Location: [facility];  Service: Endoscopy;  Laterality: N/A;   knee surgeries      Medications:  Current Outpatient Medications on File Prior to Visit  Medication Sig   Ascorbic Acid (VITAMIN C) 1000 MG tablet Take 1,000 mg by mouth daily.   Aspirin 81 MG CAPS Take 81 mg by mouth daily.   Cholecalciferol (VITAMIN D3) 25 MCG (1000 UT) CAPS Take 1 capsule by mouth daily.   Glucosamine-Chondroit-Biofl-Mn (GLUCOSAMINE CHONDROIT,BIOFLAV, PO) Take 1 capsule by mouth daily.   icosapent Ethyl (VASCEPA) 1 g capsule Take 2 capsules (2 g total) by mouth 2 (two) times daily.   magnesium gluconate (MAGONATE) 500 MG tablet Take 500 mg by mouth daily.   Omega-3 Fatty Acids (FISH OIL) 1200 MG CAPS Take 1 capsule by mouth daily.   ondansetron (ZOFRAN) 4 MG tablet Take 1 tablet (4 mg total) by mouth every 8 (eight) hours as needed for nausea or vomiting.   simethicone (MYLICON) 40 MG/0.6ML drops Take 0.6 mLs (40 mg total) by mouth 4 (four) times daily as needed for flatulence.   tiZANidine (ZANAFLEX) 4 MG tablet Take 4 mg by mouth as needed.   Vitamin E 134 MG (200 UNIT) TABS Take 1 tablet by mouth daily.   Zinc 50 MG CAPS Take 1 capsule by mouth daily.   No current facility-administered medications on file prior to visit.    Allergies:  No Known Allergies  Social History:  Social History   Socioeconomic History   Marital status: Divorced    Spouse name: Not on file   Number of children: Not on file   Years of education: Not on file   Highest education level: Not on file  Occupational History   Not on file  Tobacco Use   Smoking status: Former    Packs/day: 2.00    Years: 20.00    Additional pack years: 0.00    Total pack  years: 40.00    Types: Cigarettes    Quit date: 10/04/1998    Years since quitting: 24.0   Smokeless tobacco: Current    Types: Snuff   Tobacco comments:    dip occassionally  Vaping Use   Vaping Use: Never used  Substance and Sexual Activity   Alcohol use: Yes    Comment: occ (weekends)   Drug use: No    Types: Marijuana   Sexual activity: Not on file  Other Topics Concern   Not on file  Social History Narrative   Not on file   Social Determinants of Health  Financial Resource Strain: Not on file  Food Insecurity: Not on file  Transportation Needs: Not on file  Physical Activity: Not on file  Stress: Not on file  Social Connections: Not on file  Intimate Partner Violence: Not on file   Social History   Tobacco Use  Smoking Status Former   Packs/day: 2.00   Years: 20.00   Additional pack years: 0.00   Total pack years: 40.00   Types: Cigarettes   Quit date: 10/04/1998   Years since quitting: 24.0  Smokeless Tobacco Current   Types: Snuff  Tobacco Comments   dip occassionally   Social History   Substance and Sexual Activity  Alcohol Use Yes   Comment: occ (weekends)    Family History:  Family History  Problem Relation Age of Onset   Hyperlipidemia Father    Hypertension Father    Heart disease Father    Heart attack Father 71    Past medical history, surgical history, medications, allergies, family history and social history reviewed with patient today and changes made to appropriate areas of the chart.   Review of Systems  Constitutional: Negative.   HENT: Negative.    Eyes: Negative.   Respiratory:  Positive for wheezing. Negative for cough, hemoptysis, sputum production and shortness of breath.   Cardiovascular: Negative.   Gastrointestinal: Negative.   Genitourinary: Negative.   Musculoskeletal: Negative.   Skin: Negative.   Neurological:  Positive for tingling. Negative for dizziness, tremors, sensory change, speech change, focal weakness,  seizures, loss of consciousness, weakness and headaches.  Endo/Heme/Allergies: Negative.   Psychiatric/Behavioral: Negative.     All other ROS negative except what is listed above and in the HPI.      Objective:    BP 137/61   Pulse (!) 38   Temp 97.9 F (36.6 C) (Oral)   Ht 5\' 7"  (1.702 m)   Wt 205 lb (93 kg)   SpO2 96%   BMI 32.11 kg/m   Wt Readings from Last 3 Encounters:  10/08/22 205 lb (93 kg)  08/12/22 207 lb 4 oz (94 kg)  05/07/22 199 lb 1.6 oz (90.3 kg)    Hearing Screening   500Hz  1000Hz  2000Hz  4000Hz   Right ear 25 25 25 25   Left ear 25 25 25 25    Vision Screening   Right eye Left eye Both eyes  Without correction     With correction   20/20    Physical Exam Vitals and nursing note reviewed.  Constitutional:      General: He is not in acute distress.    Appearance: Normal appearance. He is obese. He is not ill-appearing, toxic-appearing or diaphoretic.  HENT:     Head: Normocephalic and atraumatic.     Right Ear: Tympanic membrane, ear canal and external ear normal. There is no impacted cerumen.     Left Ear: Tympanic membrane, ear canal and external ear normal. There is no impacted cerumen.     Nose: Nose normal. No congestion or rhinorrhea.     Mouth/Throat:     Mouth: Mucous membranes are moist.     Pharynx: Oropharynx is clear. No oropharyngeal exudate or posterior oropharyngeal erythema.  Eyes:     General: No scleral icterus.       Right eye: No discharge.        Left eye: No discharge.     Extraocular Movements: Extraocular movements intact.     Conjunctiva/sclera: Conjunctivae normal.     Pupils: Pupils are equal, round,  and reactive to light.  Neck:     Vascular: No carotid bruit.  Cardiovascular:     Rate and Rhythm: Normal rate and regular rhythm.     Pulses: Normal pulses.     Heart sounds: No murmur heard.    No friction rub. No gallop.  Pulmonary:     Effort: Pulmonary effort is normal. No respiratory distress.     Breath sounds:  Normal breath sounds. No stridor. No wheezing, rhonchi or rales.  Chest:     Chest wall: No tenderness.  Abdominal:     General: Abdomen is flat. Bowel sounds are normal. There is no distension.     Palpations: Abdomen is soft. There is no mass.     Tenderness: There is no abdominal tenderness. There is no right CVA tenderness, left CVA tenderness, guarding or rebound.     Hernia: No hernia is present.  Genitourinary:    Comments: Genital exam deferred with shared decision making Musculoskeletal:        General: No swelling, tenderness, deformity or signs of injury.     Cervical back: Normal range of motion and neck supple. No rigidity. No muscular tenderness.     Right lower leg: No edema.     Left lower leg: No edema.  Lymphadenopathy:     Cervical: No cervical adenopathy.  Skin:    General: Skin is warm and dry.     Capillary Refill: Capillary refill takes less than 2 seconds.     Coloration: Skin is not jaundiced or pale.     Findings: No bruising, erythema, lesion or rash.  Neurological:     General: No focal deficit present.     Mental Status: He is alert and oriented to person, place, and time.     Cranial Nerves: No cranial nerve deficit.     Sensory: No sensory deficit.     Motor: No weakness.     Coordination: Coordination normal.     Gait: Gait normal.     Deep Tendon Reflexes: Reflexes normal.  Psychiatric:        Mood and Affect: Mood normal.        Behavior: Behavior normal.        Thought Content: Thought content normal.        Judgment: Judgment normal.        10/08/2022    8:24 AM  6CIT Screen  What Year? 0 points  What month? 0 points  What time? 0 points  Count back from 20 0 points  Months in reverse 0 points  Repeat phrase 0 points  Total Score 0 points     Results for orders placed or performed in visit on 10/08/22  Microscopic Examination   Urine  Result Value Ref Range   WBC, UA 0-5 0 - 5 /hpf   RBC, Urine 0-2 0 - 2 /hpf   Epithelial  Cells (non renal) None seen 0 - 10 /hpf   Mucus, UA Present (A) Not Estab.   Bacteria, UA None seen None seen/Few  Comprehensive metabolic panel  Result Value Ref Range   Glucose 98 70 - 99 mg/dL   BUN 19 8 - 27 mg/dL   Creatinine, Ser 2.13 0.76 - 1.27 mg/dL   eGFR 84 >08 MV/HQI/6.96   BUN/Creatinine Ratio 19 10 - 24   Sodium 143 134 - 144 mmol/L   Potassium 4.1 3.5 - 5.2 mmol/L   Chloride 106 96 - 106 mmol/L   CO2 24 20 -  29 mmol/L   Calcium 9.5 8.6 - 10.2 mg/dL   Total Protein 6.3 6.0 - 8.5 g/dL   Albumin 4.2 3.9 - 4.9 g/dL   Globulin, Total 2.1 1.5 - 4.5 g/dL   Bilirubin Total 0.5 0.0 - 1.2 mg/dL   Alkaline Phosphatase 52 44 - 121 IU/L   AST 19 0 - 40 IU/L   ALT 18 0 - 44 IU/L  CBC with Differential/Platelet  Result Value Ref Range   WBC 7.1 3.4 - 10.8 x10E3/uL   RBC 3.91 (L) 4.14 - 5.80 x10E6/uL   Hemoglobin 12.1 (L) 13.0 - 17.7 g/dL   Hematocrit 16.1 09.6 - 51.0 %   MCV 96 79 - 97 fL   MCH 30.9 26.6 - 33.0 pg   MCHC 32.3 31.5 - 35.7 g/dL   RDW 04.5 40.9 - 81.1 %   Platelets 202 150 - 450 x10E3/uL   Neutrophils 69 Not Estab. %   Lymphs 18 Not Estab. %   Monocytes 9 Not Estab. %   Eos 3 Not Estab. %   Basos 1 Not Estab. %   Neutrophils Absolute 4.9 1.4 - 7.0 x10E3/uL   Lymphocytes Absolute 1.3 0.7 - 3.1 x10E3/uL   Monocytes Absolute 0.6 0.1 - 0.9 x10E3/uL   EOS (ABSOLUTE) 0.2 0.0 - 0.4 x10E3/uL   Basophils Absolute 0.0 0.0 - 0.2 x10E3/uL   Immature Granulocytes 0 Not Estab. %   Immature Grans (Abs) 0.0 0.0 - 0.1 x10E3/uL  Lipid Panel w/o Chol/HDL Ratio  Result Value Ref Range   Cholesterol, Total 149 100 - 199 mg/dL   Triglycerides 82 0 - 149 mg/dL   HDL 62 >91 mg/dL   VLDL Cholesterol Cal 16 5 - 40 mg/dL   LDL Chol Calc (NIH) 71 0 - 99 mg/dL  PSA  Result Value Ref Range   Prostate Specific Ag, Serum 0.7 0.0 - 4.0 ng/mL  TSH  Result Value Ref Range   TSH 1.370 0.450 - 4.500 uIU/mL  Urinalysis, Routine w reflex microscopic  Result Value Ref Range    Specific Gravity, UA 1.025 1.005 - 1.030   pH, UA 6.5 5.0 - 7.5   Color, UA Yellow Yellow   Appearance Ur Clear Clear   Leukocytes,UA Negative Negative   Protein,UA 1+ (A) Negative/Trace   Glucose, UA Negative Negative   Ketones, UA Trace (A) Negative   RBC, UA Negative Negative   Bilirubin, UA Negative Negative   Urobilinogen, Ur 1.0 0.2 - 1.0 mg/dL   Nitrite, UA Negative Negative   Microscopic Examination See below:   Microalbumin, Urine Waived  Result Value Ref Range   Microalb, Ur Waived 80 (H) 0 - 19 mg/L   Creatinine, Urine Waived 300 10 - 300 mg/dL   Microalb/Creat Ratio 30-300 (H) <30 mg/g  Bayer DCA Hb A1c Waived  Result Value Ref Range   HB A1C (BAYER DCA - WAIVED) 6.1 (H) 4.8 - 5.6 %  Uric acid  Result Value Ref Range   Uric Acid 4.0 3.8 - 8.4 mg/dL      Assessment & Plan:   Problem List Items Addressed This Visit       Cardiovascular and Mediastinum   Hypertension    Under good control on current regimen. Continue current regimen. Continue to monitor. Call with any concerns. Refills given. Labs drawn today.        Relevant Medications   amLODipine (NORVASC) 2.5 MG tablet   atorvastatin (LIPITOR) 80 MG tablet   ezetimibe (ZETIA) 10 MG tablet  hydrochlorothiazide (HYDRODIURIL) 12.5 MG tablet   losartan (COZAAR) 100 MG tablet   Other Relevant Orders   Comprehensive metabolic panel (Completed)   CBC with Differential/Platelet (Completed)   TSH (Completed)   Urinalysis, Routine w reflex microscopic (Completed)   Microalbumin, Urine Waived (Completed)   Aortic atherosclerosis (HCC)    Will keep BP and cholesterol under good control. Continue to monitor. Call with any concerns.       Relevant Medications   amLODipine (NORVASC) 2.5 MG tablet   atorvastatin (LIPITOR) 80 MG tablet   ezetimibe (ZETIA) 10 MG tablet   hydrochlorothiazide (HYDRODIURIL) 12.5 MG tablet   losartan (COZAAR) 100 MG tablet   Other Relevant Orders   Comprehensive metabolic panel  (Completed)   CBC with Differential/Platelet (Completed)   Lipid Panel w/o Chol/HDL Ratio (Completed)     Respiratory   COPD (chronic obstructive pulmonary disease) (HCC)    Under good control on current regimen. Continue current regimen. Continue to monitor. Call with any concerns. Refills given. Labs drawn today.        Relevant Medications   albuterol (VENTOLIN HFA) 108 (90 Base) MCG/ACT inhaler   Budeson-Glycopyrrol-Formoterol (BREZTRI AEROSPHERE) 160-9-4.8 MCG/ACT AERO   loratadine (CLARITIN) 10 MG tablet     Endocrine   Controlled type 2 diabetes mellitus with complication, without long-term current use of insulin (HCC)    Stable with A1c of 6.1. Continue current regimen. Call with any concerns.       Relevant Medications   atorvastatin (LIPITOR) 80 MG tablet   losartan (COZAAR) 100 MG tablet   Other Relevant Orders   Comprehensive metabolic panel (Completed)   CBC with Differential/Platelet (Completed)   Microalbumin, Urine Waived (Completed)   Bayer DCA Hb A1c Waived (Completed)     Musculoskeletal and Integument   DDD (degenerative disc disease), lumbar    Under good control on current regimen. Continue current regimen. Continue to monitor. Call with any concerns. Refills given.        Relevant Medications   allopurinol (ZYLOPRIM) 300 MG tablet   colchicine 0.6 MG tablet   gabapentin (NEURONTIN) 300 MG capsule   naproxen (NAPROSYN) 500 MG tablet     Other   High cholesterol    Under good control on current regimen. Continue current regimen. Continue to monitor. Call with any concerns. Refills given. Labs drawn today.       Relevant Medications   amLODipine (NORVASC) 2.5 MG tablet   atorvastatin (LIPITOR) 80 MG tablet   ezetimibe (ZETIA) 10 MG tablet   hydrochlorothiazide (HYDRODIURIL) 12.5 MG tablet   losartan (COZAAR) 100 MG tablet   Other Relevant Orders   Comprehensive metabolic panel (Completed)   CBC with Differential/Platelet (Completed)   Lipid  Panel w/o Chol/HDL Ratio (Completed)   Gout    Under good control on current regimen. Continue current regimen. Continue to monitor. Call with any concerns. Refills given. Labs drawn today.        Relevant Medications   allopurinol (ZYLOPRIM) 300 MG tablet   naproxen (NAPROSYN) 500 MG tablet   Other Relevant Orders   Comprehensive metabolic panel (Completed)   CBC with Differential/Platelet (Completed)   Uric acid (Completed)   Bradycardia    Has seen cardiology. Asymptomatic. Call with any concerns. Continue to monitor. Watch on watch and call if staying below 40.      Former smoker   Relevant Orders   [facility]ORTA MEDICARE SCREENING   Other Visit Diagnoses     Routine general medical examination  at a health care facility    -  Primary   Vaccines up to date. Screening labs checked today. Colonoscopy up to date. AAA screening ordered. Continue diet and exercise. Call with any concerns.   Screening for prostate cancer       Labs drawn today. Await results.   Relevant Orders   PSA (Completed)   Need for vaccination for pneumococcus       Prevnar 20 given today   Relevant Orders   Pneumococcal conjugate vaccine 20-valent (Prevnar 20) (Completed)   Screening for cardiovascular condition       EKG done today. Continue to monitor.   Relevant Orders   EKG 12-Lead (Completed)   [facility] MEDICARE SCREENING        Preventative Services:  AAA Screening: ordered today Health Risk Assessment and Personalized Prevention Plan: up to date Bone Mass Measurements: N/A CVD Screening: Done today Colon Cancer Screening: up to date Depression Screening: done today Diabetes Screening: Done today Glaucoma Screening: See your eye doctor Hepatitis B vaccine: N/A Hepatitis C screening: Up to date HIV Screening: up to date Flu Vaccine: Get in the fall Lung cancer Screening: Up to date Obesity Screening: Done today Pneumonia Vaccines: STI Screening: N/A PSA screening: Done  today  Discussed aspirin prophylaxis for myocardial infarction prevention and decision was made to continue ASA  LABORATORY TESTING:  Health maintenance labs ordered today as discussed above.   The natural history of prostate cancer and ongoing controversy regarding screening and potential treatment outcomes of prostate cancer has been discussed with the patient. The meaning of a false positive PSA and a false negative PSA has been discussed. He indicates understanding of the limitations of this screening test and wishes to proceed with screening PSA testing.   IMMUNIZATIONS:   - Tdap: Tetanus vaccination status reviewed: last tetanus booster within 10 years. - Influenza: Postponed to flu season - Pneumovax: Up to date - Prevnar: Administered today - Zostavax vaccine: Refused  SCREENING: - Colonoscopy: Up to date  Discussed with patient purpose of the colonoscopy is to detect colon cancer at curable precancerous or early stages   - AAA Screening: Ordered today  -Hearing Test: Ordered today    PATIENT COUNSELING:    Sexuality: Discussed sexually transmitted diseases, partner selection, use of condoms, avoidance of unintended pregnancy  and contraceptive alternatives.   Advised to avoid cigarette smoking.  I discussed with the patient that most people either abstain from alcohol or drink within safe limits (<=14/week and <=4 drinks/occasion for males, <=7/weeks and <= 3 drinks/occasion for females) and that the risk for alcohol disorders and other health effects rises proportionally with the number of drinks per week and how often a drinker exceeds daily limits.  Discussed cessation/primary prevention of drug use and availability of treatment for abuse.   Diet: Encouraged to adjust caloric intake to maintain  or achieve ideal body weight, to reduce intake of dietary saturated fat and total fat, to limit sodium intake by avoiding high sodium foods and not adding table salt, and to  maintain adequate dietary potassium and calcium preferably from fresh fruits, vegetables, and low-fat dairy products.    stressed the importance of regular exercise  Injury prevention: Discussed safety belts, safety helmets, smoke detector, smoking near bedding or upholstery.   Dental health: Discussed importance of regular tooth brushing, flossing, and dental visits.   Follow up plan: NEXT PREVENTATIVE PHYSICAL DUE IN 1 YEAR. Return in about 6 months (around 04/10/2023).

## 2022-10-09 LAB — COMPREHENSIVE METABOLIC PANEL
ALT: 18 IU/L (ref 0–44)
AST: 19 IU/L (ref 0–40)
Albumin: 4.2 g/dL (ref 3.9–4.9)
Alkaline Phosphatase: 52 IU/L (ref 44–121)
BUN/Creatinine Ratio: 19 (ref 10–24)
BUN: 19 mg/dL (ref 8–27)
Bilirubin Total: 0.5 mg/dL (ref 0.0–1.2)
CO2: 24 mmol/L (ref 20–29)
Calcium: 9.5 mg/dL (ref 8.6–10.2)
Chloride: 106 mmol/L (ref 96–106)
Creatinine, Ser: 1 mg/dL (ref 0.76–1.27)
Globulin, Total: 2.1 g/dL (ref 1.5–4.5)
Glucose: 98 mg/dL (ref 70–99)
Potassium: 4.1 mmol/L (ref 3.5–5.2)
Sodium: 143 mmol/L (ref 134–144)
Total Protein: 6.3 g/dL (ref 6.0–8.5)
eGFR: 84 mL/min/{1.73_m2} (ref >59)

## 2022-10-09 LAB — CBC WITH DIFFERENTIAL/PLATELET
Basophils Absolute: 0 10*3/uL (ref 0.0–0.2)
Basos: 1 %
EOS (ABSOLUTE): 0.2 10*3/uL (ref 0.0–0.4)
Eos: 3 %
Hematocrit: 37.5 % (ref 37.5–51.0)
Hemoglobin: 12.1 g/dL — ABNORMAL LOW (ref 13.0–17.7)
Immature Grans (Abs): 0 10*3/uL (ref 0.0–0.1)
Immature Granulocytes: 0 %
Lymphocytes Absolute: 1.3 10*3/uL (ref 0.7–3.1)
Lymphs: 18 %
MCH: 30.9 pg (ref 26.6–33.0)
MCHC: 32.3 g/dL (ref 31.5–35.7)
MCV: 96 fL (ref 79–97)
Monocytes Absolute: 0.6 10*3/uL (ref 0.1–0.9)
Monocytes: 9 %
Neutrophils Absolute: 4.9 10*3/uL (ref 1.4–7.0)
Neutrophils: 69 %
Platelets: 202 10*3/uL (ref 150–450)
RBC: 3.91 x10E6/uL — ABNORMAL LOW (ref 4.14–5.80)
RDW: 13.5 % (ref 11.6–15.4)
WBC: 7.1 10*3/uL (ref 3.4–10.8)

## 2022-10-09 LAB — LIPID PANEL W/O CHOL/HDL RATIO
Cholesterol, Total: 149 mg/dL (ref 100–199)
HDL: 62 mg/dL (ref >39)
LDL Chol Calc (NIH): 71 mg/dL (ref 0–99)
Triglycerides: 82 mg/dL (ref 0–149)
VLDL Cholesterol Cal: 16 mg/dL (ref 5–40)

## 2022-10-09 LAB — PSA: Prostate Specific Ag, Serum: 0.7 ng/mL (ref 0.0–4.0)

## 2022-10-09 LAB — TSH: TSH: 1.37 u[IU]/mL (ref 0.450–4.500)

## 2022-10-09 LAB — URIC ACID: Uric Acid: 4 mg/dL (ref 3.8–8.4)

## 2022-10-11 ENCOUNTER — Encounter: Payer: Self-pay | Admitting: Family Medicine

## 2022-10-11 NOTE — Assessment & Plan Note (Signed)
Under good control on current regimen. Continue current regimen. Continue to monitor. Call with any concerns. Refills given. Labs drawn today.   

## 2022-10-11 NOTE — Assessment & Plan Note (Signed)
Under good control on current regimen. Continue current regimen. Continue to monitor. Call with any concerns. Refills given.   

## 2022-10-11 NOTE — Assessment & Plan Note (Signed)
Will keep BP and cholesterol under good control. Continue to monitor. Call with any concerns.  

## 2022-10-11 NOTE — Assessment & Plan Note (Signed)
Has seen cardiology. Asymptomatic. Call with any concerns. Continue to monitor. Watch on watch and call if staying below 40.

## 2022-10-11 NOTE — Assessment & Plan Note (Signed)
Stable with A1c of 6.1. Continue current regimen. Call with any concerns.

## 2022-10-21 ENCOUNTER — Encounter: Payer: Self-pay | Admitting: Family Medicine

## 2022-10-23 ENCOUNTER — Ambulatory Visit (HOSPITAL_COMMUNITY)
Admission: RE | Admit: 2022-10-23 | Discharge: 2022-10-23 | Disposition: A | Discharge status: Home / Self Care | Payer: BC Managed Care – PPO | Source: Ambulatory Visit | Attending: Family Medicine | Admitting: Family Medicine

## 2022-10-23 DIAGNOSIS — Z87891 Personal history of nicotine dependence: Secondary | ICD-10-CM | POA: Diagnosis not present

## 2022-10-23 DIAGNOSIS — Z136 Encounter for screening for cardiovascular disorders: Secondary | ICD-10-CM | POA: Diagnosis not present

## 2022-10-25 NOTE — Progress Notes (Signed)
Contacted via MyChart   Good evening [name], screening returned and no aortic aneurysm noted!!  Woohoo!!

## 2022-10-29 DIAGNOSIS — M5416 Radiculopathy, lumbar region: Secondary | ICD-10-CM | POA: Diagnosis not present

## 2022-10-29 DIAGNOSIS — M5126 Other intervertebral disc displacement, lumbar region: Secondary | ICD-10-CM | POA: Diagnosis not present

## 2022-10-29 DIAGNOSIS — M5136 Other intervertebral disc degeneration, lumbar region: Secondary | ICD-10-CM | POA: Diagnosis not present

## 2022-11-22 ENCOUNTER — Telehealth: Payer: Self-pay | Admitting: Cardiovascular Disease

## 2022-11-22 MED ORDER — ICOSAPENT ETHYL 1 G PO CAPS: 2.0000 g | ORAL_CAPSULE | Freq: Two times a day (BID) | ORAL | 3 refills | Status: DC

## 2022-11-22 NOTE — Telephone Encounter (Signed)
*  STAT* If patient is at the pharmacy, call can be transferred to refill team.   1. Which medications need to be refilled? (please list name of each medication and dose if known)   icosapent Ethyl (VASCEPA) 1 g capsule    2. Which pharmacy/location (including street and city if local pharmacy) is medication to be sent to? [facility] [place] - [id number]    3. Do they need a 30 day or 90 day supply? 90 day

## 2022-11-22 NOTE — Telephone Encounter (Signed)
Requested Prescriptions   Signed Prescriptions Disp Refills   icosapent Ethyl (VASCEPA) 1 g capsule 360 capsule 3    Sig: Take 2 capsules (2 g total) by mouth 2 (two) times daily.    Authorizing Provider: Antonieta Iba    Ordering User: Kendrick Fries

## 2022-11-29 ENCOUNTER — Other Ambulatory Visit: Payer: Self-pay | Admitting: Family Medicine

## 2022-11-29 NOTE — Telephone Encounter (Signed)
Medication Refill - Medication: Budeson-Glycopyrrol-Formoterol (BREZTRI AEROSPHERE) 160-9-4.8 MCG/ACT AERO   Has the patient contacted their pharmacy? No.   Preferred Pharmacy (with phone number or street name):  [facility] - [place], [place] - [address] Phone: [phone]  Fax: [phone]     Has the patient been seen for an appointment in the last year OR does the patient have an upcoming appointment? Yes.    Agent: Please be advised that RX refills may take up to 3 business days. We ask that you follow-up with your pharmacy.

## 2022-12-02 NOTE — Telephone Encounter (Signed)
Requested medications are due for refill today.  unsure  Requested medications are on the active medications list.  yes  Last refill. 10/08/2022  Future visit scheduled.   yes  Notes to clinic.  Medication not assigned a protocol. Also rx written 10/08/2022 was classified as "sample".    Requested Prescriptions  Pending Prescriptions Disp Refills   Budeson-Glycopyrrol-Formoterol (BREZTRI AEROSPHERE) 160-9-4.8 MCG/ACT AERO      Sig: Inhale 2 puffs into the lungs 2 (two) times daily.     Off-Protocol Failed - 11/29/2022 12:13 PM      Failed - Medication not assigned to a protocol, review manually.      Passed - Valid encounter within last 12 months    Recent Outpatient Visits           1 month ago Routine general medical examination at a health care facility   [facility] [place], [place], DO   6 months ago Former smoker   [place] [facility] [place], [place], DO   7 months ago Pneumonia of right upper lobe due to infectious organism   [place] [facility] [place], [place], DO   7 months ago High cholesterol   [place] [facility] [place], [place], DO   12 months ago COPD exacerbation [facility])   [place] [facility] [place], Oralia Rud, DO       Future Appointments             In 4 months [place], Oralia Rud, DO  [facility]

## 2022-12-03 MED ORDER — BREZTRI AEROSPHERE 160-9-4.8 MCG/ACT IN AERO: 2.0000 | INHALATION_SPRAY | Freq: Two times a day (BID) | RESPIRATORY_TRACT | 1 refills | Status: DC

## 2023-04-04 ENCOUNTER — Other Ambulatory Visit: Payer: Self-pay | Admitting: Family Medicine

## 2023-04-09 DIAGNOSIS — M1712 Unilateral primary osteoarthritis, left knee: Secondary | ICD-10-CM

## 2023-04-09 HISTORY — DX: Unilateral primary osteoarthritis, left knee: M17.12

## 2023-04-10 NOTE — Telephone Encounter (Signed)
 Requested Prescriptions  Pending Prescriptions Disp Refills   amLODipine  (NORVASC ) 2.5 MG tablet [Pharmacy Med Name: AMLODIPINE  BESYLATE 2.5 MG TAB] 30 tablet 2    Sig: TAKE ONE TABLET BY MOUTH EVERY DAY     Cardiovascular: Calcium  Channel Blockers 2 Failed - 04/10/2023  9:32 AM      Failed - Last Heart Rate in normal range    Pulse Readings from Last 1 Encounters:  10/08/22 (!) 38         Failed - Valid encounter within last 6 months    Recent Outpatient Visits           6 months ago Routine general medical examination at a health care facility   [facility] [place], Megan P, DO   11 months ago Former smoker   [place] [employer], [place], DO   11 months ago Pneumonia of right upper lobe due to infectious organism   [place] [facility] [place], [place], DO   1 year ago High cholesterol   [place] [facility] [place], Megan P, DO   1 year ago COPD exacerbation [facility])   [place] [facility] [place], [place], DO       Future Appointments             In 5 days Vicci Duwaine SQUIBB, DO [place] [facility]            Passed - Last BP in normal range    BP Readings from Last 1 Encounters:  10/08/22 137/61

## 2023-04-15 ENCOUNTER — Encounter: Payer: Self-pay | Admitting: Family Medicine

## 2023-04-15 ENCOUNTER — Ambulatory Visit: Payer: BC Managed Care – PPO | Admitting: Family Medicine

## 2023-04-15 VITALS — BP 142/68 | HR 46 | Wt 206.8 lb

## 2023-04-15 DIAGNOSIS — I10 Essential (primary) hypertension: Secondary | ICD-10-CM

## 2023-04-15 DIAGNOSIS — E7800 Pure hypercholesterolemia, unspecified: Secondary | ICD-10-CM | POA: Diagnosis not present

## 2023-04-15 DIAGNOSIS — I700 Atherosclerosis of aorta: Secondary | ICD-10-CM | POA: Diagnosis not present

## 2023-04-15 DIAGNOSIS — E118 Type 2 diabetes mellitus with unspecified complications: Secondary | ICD-10-CM

## 2023-04-15 DIAGNOSIS — M51369 Other intervertebral disc degeneration, lumbar region without mention of lumbar back pain or lower extremity pain: Secondary | ICD-10-CM

## 2023-04-15 DIAGNOSIS — M1712 Unilateral primary osteoarthritis, left knee: Secondary | ICD-10-CM

## 2023-04-15 DIAGNOSIS — J449 Chronic obstructive pulmonary disease, unspecified: Secondary | ICD-10-CM

## 2023-04-15 DIAGNOSIS — M1000 Idiopathic gout, unspecified site: Secondary | ICD-10-CM

## 2023-04-15 LAB — BAYER DCA HB A1C WAIVED: HB A1C (BAYER DCA - WAIVED): 5.6 % (ref 4.8–5.6)

## 2023-04-15 MED ORDER — LOSARTAN POTASSIUM 100 MG PO TABS: ORAL_TABLET | ORAL | 1 refills | Status: DC

## 2023-04-15 MED ORDER — ATORVASTATIN CALCIUM 80 MG PO TABS: 80.0000 mg | ORAL_TABLET | Freq: Every day | ORAL | 1 refills | Status: DC

## 2023-04-15 MED ORDER — NAPROXEN 500 MG PO TABS: ORAL_TABLET | ORAL | 1 refills | Status: DC

## 2023-04-15 MED ORDER — HYDROCHLOROTHIAZIDE 25 MG PO TABS
25.0000 mg | ORAL_TABLET | Freq: Every day | ORAL | 1 refills | Status: DC
Start: 2023-04-15 — End: 2023-11-05

## 2023-04-15 MED ORDER — COLCHICINE 0.6 MG PO TABS: 0.6000 mg | ORAL_TABLET | Freq: Every day | ORAL | 1 refills | Status: DC | PRN

## 2023-04-15 MED ORDER — ALLOPURINOL 300 MG PO TABS
300.0000 mg | ORAL_TABLET | Freq: Every day | ORAL | 1 refills | Status: DC
Start: 2023-04-15 — End: 2023-10-20

## 2023-04-15 MED ORDER — OMEPRAZOLE 20 MG PO CPDR: 20.0000 mg | DELAYED_RELEASE_CAPSULE | Freq: Every day | ORAL | 1 refills | Status: DC

## 2023-04-15 MED ORDER — EZETIMIBE 10 MG PO TABS: 10.0000 mg | ORAL_TABLET | Freq: Every day | ORAL | 1 refills | Status: DC

## 2023-04-15 MED ORDER — GABAPENTIN 300 MG PO CAPS: 600.0000 mg | ORAL_CAPSULE | Freq: Three times a day (TID) | ORAL | 1 refills | Status: DC

## 2023-04-15 MED ORDER — LORATADINE 10 MG PO TABS: 10.0000 mg | ORAL_TABLET | Freq: Every day | ORAL | 1 refills | Status: DC

## 2023-04-15 MED ORDER — AMLODIPINE BESYLATE 2.5 MG PO TABS: 2.5000 mg | ORAL_TABLET | Freq: Every day | ORAL | 1 refills | Status: DC

## 2023-04-15 MED ORDER — BREZTRI AEROSPHERE 160-9-4.8 MCG/ACT IN AERO: 2.0000 | INHALATION_SPRAY | Freq: Two times a day (BID) | RESPIRATORY_TRACT | 1 refills | Status: DC

## 2023-04-15 NOTE — Assessment & Plan Note (Addendum)
 Running a little high. Will increase his hydrochlorothiazide to 25mg  and recheck in 1 month. Call with any concerns.

## 2023-04-15 NOTE — Assessment & Plan Note (Signed)
 Under good control on current regimen. Continue current regimen. Continue to monitor. Call with any concerns. Refills given. Labs drawn today.

## 2023-04-15 NOTE — Progress Notes (Signed)
 BP (!) 142/68   Pulse (!) 46   Wt 206 lb 12.8 oz (93.8 kg)   SpO2 96%   BMI 32.39 kg/m    Subjective:    Patient ID: Daniel Cook, male    DOB: 1955-09-28, 68 y.o.   MRN: [id number]  HPI: Daniel Cook is a 68 y.o. male  Chief Complaint  Patient presents with   Diabetes   Hypertension   DIABETES Hypoglycemic episodes:no Polydipsia/polyuria: no Visual disturbance: no Chest pain: no Paresthesias: no Glucose Monitoring: no Taking Insulin?: no Blood Pressure Monitoring: rarely Retinal Examination: Up to Date Foot Exam: Up to Date Diabetic Education: Completed Pneumovax: Up to Date Influenza: Not up to Date Aspirin: yes  No gout flares. Tolerating medicine well.  HYPERTENSION / HYPERLIPIDEMIA Satisfied with current treatment? yes Duration of hypertension: chronic BP monitoring frequency: rarely BP range:  BP medication side effects: no Past BP meds: amlodipine , hydrochlorothiazide , losartan  Duration of hyperlipidemia: chronic Cholesterol medication side effects: no Cholesterol supplements: fish oil Past cholesterol medications: atorvastatin  Medication compliance: excellent compliance Aspirin: yes Recent stressors: no Recurrent headaches: no Visual changes: no Palpitations: no Dyspnea: no Chest pain: no Lower extremity edema: no Dizzy/lightheaded: no  COPD COPD status: controlled Satisfied with current treatment?: yes Oxygen use: no Dyspnea frequency: rarely Cough frequency: rarely Rescue inhaler frequency: never   Limitation of activity: no Pneumovax: Up to Date Influenza: Not up to Date    Relevant past medical, surgical, family and social history reviewed and updated as indicated. Interim medical history since our last visit reviewed. Allergies and medications reviewed and updated.  Review of Systems  Constitutional: Negative.   Respiratory: Negative.    Cardiovascular: Negative.   Musculoskeletal:  Positive for arthralgias. Negative  for back pain, gait problem, joint swelling, myalgias, neck pain and neck stiffness.  Neurological: Negative.   Psychiatric/Behavioral: Negative.      Per HPI unless specifically indicated above     Objective:    BP (!) 142/68   Pulse (!) 46   Wt 206 lb 12.8 oz (93.8 kg)   SpO2 96%   BMI 32.39 kg/m   Wt Readings from Last 3 Encounters:  04/15/23 206 lb 12.8 oz (93.8 kg)  10/08/22 205 lb (93 kg)  08/12/22 207 lb 4 oz (94 kg)    Physical Exam Vitals and nursing note reviewed.  Constitutional:      General: He is not in acute distress.    Appearance: Normal appearance. He is not ill-appearing, toxic-appearing or diaphoretic.  HENT:     Head: Normocephalic and atraumatic.     Right Ear: External ear normal.     Left Ear: External ear normal.     Nose: Nose normal.     Mouth/Throat:     Mouth: Mucous membranes are moist.     Pharynx: Oropharynx is clear.  Eyes:     General: No scleral icterus.       Right eye: No discharge.        Left eye: No discharge.     Extraocular Movements: Extraocular movements intact.     Conjunctiva/sclera: Conjunctivae normal.     Pupils: Pupils are equal, round, and reactive to light.  Cardiovascular:     Rate and Rhythm: Normal rate and regular rhythm.     Pulses: Normal pulses.     Heart sounds: Normal heart sounds. No murmur heard.    No friction rub. No gallop.  Pulmonary:     Effort: Pulmonary effort is normal.  No respiratory distress.     Breath sounds: Normal breath sounds. No stridor. No wheezing, rhonchi or rales.  Chest:     Chest wall: No tenderness.  Musculoskeletal:        General: Normal range of motion.     Cervical back: Normal range of motion and neck supple.  Skin:    General: Skin is warm and dry.     Capillary Refill: Capillary refill takes less than 2 seconds.     Coloration: Skin is not jaundiced or pale.     Findings: No bruising, erythema, lesion or rash.  Neurological:     General: No focal deficit present.      Mental Status: He is alert and oriented to person, place, and time. Mental status is at baseline.  Psychiatric:        Mood and Affect: Mood normal.        Behavior: Behavior normal.        Thought Content: Thought content normal.        Judgment: Judgment normal.     Results for orders placed or performed in visit on 10/21/22  HM DIABETES EYE EXAM   Collection Time: 08/20/22 12:00 AM  Result Value Ref Range   HM Diabetic Eye Exam No Retinopathy No Retinopathy      Assessment & Plan:   Problem List Items Addressed This Visit       Cardiovascular and Mediastinum   Hypertension   Running a little high. Will increase his hydrochlorothiazide  to 25mg  and recheck in 1 month. Call with any concerns.       Relevant Medications   amLODipine  (NORVASC ) 2.5 MG tablet   atorvastatin  (LIPITOR) 80 MG tablet   ezetimibe  (ZETIA ) 10 MG tablet   losartan  (COZAAR ) 100 MG tablet   hydrochlorothiazide  (HYDRODIURIL ) 25 MG tablet   Other Relevant Orders   CBC with Differential/Platelet   Comprehensive metabolic panel   Aortic atherosclerosis (HCC)   Will keep BP and cholesterol and sugars under good control. Continue diet and exercise. Call with any concerns.       Relevant Medications   amLODipine  (NORVASC ) 2.5 MG tablet   atorvastatin  (LIPITOR) 80 MG tablet   ezetimibe  (ZETIA ) 10 MG tablet   losartan  (COZAAR ) 100 MG tablet   hydrochlorothiazide  (HYDRODIURIL ) 25 MG tablet   Other Relevant Orders   CBC with Differential/Platelet   Comprehensive metabolic panel     Respiratory   COPD (chronic obstructive pulmonary disease) (HCC)   Under good control on current regimen. Continue current regimen. Continue to monitor. Call with any concerns. Refills given.        Relevant Medications   Budeson-Glycopyrrol-Formoterol  (BREZTRI  AEROSPHERE) 160-9-4.8 MCG/ACT AERO   loratadine  (CLARITIN ) 10 MG tablet   Other Relevant Orders   CBC with Differential/Platelet   Comprehensive metabolic  panel     Endocrine   Controlled type 2 diabetes mellitus with complication, without long-term current use of insulin (HCC)   Doing great with A1c of 5.6! Continue diet and exercise. Continue to monitor. Recheck 6 months.       Relevant Medications   atorvastatin  (LIPITOR) 80 MG tablet   losartan  (COZAAR ) 100 MG tablet   Other Relevant Orders   Bayer DCA Hb A1c Waived   CBC with Differential/Platelet   Comprehensive metabolic panel     Musculoskeletal and Integument   DDD (degenerative disc disease), lumbar   Relevant Medications   HYDROcodone -acetaminophen  (NORCO) 10-325 MG tablet   allopurinol  (ZYLOPRIM ) 300 MG tablet  colchicine  0.6 MG tablet   gabapentin  (NEURONTIN ) 300 MG capsule   naproxen  (NAPROSYN ) 500 MG tablet     Other   High cholesterol - Primary   Under good control on current regimen. Continue current regimen. Continue to monitor. Call with any concerns. Refills given. Labs drawn today.       Relevant Medications   amLODipine  (NORVASC ) 2.5 MG tablet   atorvastatin  (LIPITOR) 80 MG tablet   ezetimibe  (ZETIA ) 10 MG tablet   losartan  (COZAAR ) 100 MG tablet   hydrochlorothiazide  (HYDRODIURIL ) 25 MG tablet   Other Relevant Orders   CBC with Differential/Platelet   Comprehensive metabolic panel   Lipid Panel w/o Chol/HDL Ratio   Idiopathic gout   Under good control on current regimen. Continue current regimen. Continue to monitor. Call with any concerns. Refills given. Labs drawn today.       Relevant Medications   allopurinol  (ZYLOPRIM ) 300 MG tablet   naproxen  (NAPROSYN ) 500 MG tablet   Other Relevant Orders   Uric acid   Other Visit Diagnoses       Arthritis of left knee       Would like to see Dr. Mardee in the summer- referral placed today.   Relevant Medications   HYDROcodone -acetaminophen  (NORCO) 10-325 MG tablet   allopurinol  (ZYLOPRIM ) 300 MG tablet   colchicine  0.6 MG tablet   naproxen  (NAPROSYN ) 500 MG tablet   Other Relevant Orders    Ambulatory referral to Orthopedic Surgery        Follow up plan: Return in about 4 weeks (around 05/13/2023).

## 2023-04-15 NOTE — Assessment & Plan Note (Signed)
 Will keep BP and cholesterol and sugars under good control. Continue diet and exercise. Call with any concerns.

## 2023-04-15 NOTE — Assessment & Plan Note (Signed)
 Under good control on current regimen. Continue current regimen. Continue to monitor. Call with any concerns. Refills given.

## 2023-04-15 NOTE — Assessment & Plan Note (Addendum)
 Doing great with A1c of 5.6! Continue diet and exercise. Continue to monitor. Recheck 6 months.

## 2023-04-16 LAB — CBC WITH DIFFERENTIAL/PLATELET
Basophils Absolute: 0.1 10*3/uL (ref 0.0–0.2)
Basos: 1 %
EOS (ABSOLUTE): 0.3 10*3/uL (ref 0.0–0.4)
Eos: 4 %
Hematocrit: 39.3 % (ref 37.5–51.0)
Hemoglobin: 13 g/dL (ref 13.0–17.7)
Immature Grans (Abs): 0 10*3/uL (ref 0.0–0.1)
Immature Granulocytes: 0 %
Lymphocytes Absolute: 1.6 10*3/uL (ref 0.7–3.1)
Lymphs: 20 %
MCH: 31.9 pg (ref 26.6–33.0)
MCHC: 33.1 g/dL (ref 31.5–35.7)
MCV: 96 fL (ref 79–97)
Monocytes Absolute: 0.7 10*3/uL (ref 0.1–0.9)
Monocytes: 9 %
Neutrophils Absolute: 5.2 10*3/uL (ref 1.4–7.0)
Neutrophils: 66 %
Platelets: 230 10*3/uL (ref 150–450)
RBC: 4.08 x10E6/uL — ABNORMAL LOW (ref 4.14–5.80)
RDW: 13.8 % (ref 11.6–15.4)
WBC: 7.8 10*3/uL (ref 3.4–10.8)

## 2023-04-16 LAB — COMPREHENSIVE METABOLIC PANEL
ALT: 20 [IU]/L (ref 0–44)
AST: 21 [IU]/L (ref 0–40)
Albumin: 4.4 g/dL (ref 3.9–4.9)
Alkaline Phosphatase: 64 [IU]/L (ref 44–121)
BUN/Creatinine Ratio: 23 (ref 10–24)
BUN: 25 mg/dL (ref 8–27)
Bilirubin Total: 0.6 mg/dL (ref 0.0–1.2)
CO2: 23 mmol/L (ref 20–29)
Calcium: 9.3 mg/dL (ref 8.6–10.2)
Chloride: 103 mmol/L (ref 96–106)
Creatinine, Ser: 1.08 mg/dL (ref 0.76–1.27)
Globulin, Total: 1.9 g/dL (ref 1.5–4.5)
Glucose: 105 mg/dL — ABNORMAL HIGH (ref 70–99)
Potassium: 4 mmol/L (ref 3.5–5.2)
Sodium: 141 mmol/L (ref 134–144)
Total Protein: 6.3 g/dL (ref 6.0–8.5)
eGFR: 76 mL/min/{1.73_m2} (ref >59)

## 2023-04-16 LAB — LIPID PANEL W/O CHOL/HDL RATIO
Cholesterol, Total: 168 mg/dL (ref 100–199)
HDL: 58 mg/dL (ref >39)
LDL Chol Calc (NIH): 90 mg/dL (ref 0–99)
Triglycerides: 110 mg/dL (ref 0–149)
VLDL Cholesterol Cal: 20 mg/dL (ref 5–40)

## 2023-04-16 LAB — URIC ACID: Uric Acid: 4.6 mg/dL (ref 3.8–8.4)

## 2023-05-12 ENCOUNTER — Encounter: Payer: Self-pay | Admitting: Family Medicine

## 2023-05-18 NOTE — Telephone Encounter (Signed)
 appt

## 2023-05-20 ENCOUNTER — Other Ambulatory Visit: Payer: Self-pay | Admitting: Family Medicine

## 2023-05-21 ENCOUNTER — Ambulatory Visit: Payer: BC Managed Care – PPO | Admitting: Family Medicine

## 2023-05-21 ENCOUNTER — Ambulatory Visit
Admission: RE | Admit: 2023-05-21 | Discharge: 2023-05-21 | Disposition: A | Discharge status: Home / Self Care | Payer: BC Managed Care – PPO | Source: Ambulatory Visit | Attending: Family Medicine

## 2023-05-21 ENCOUNTER — Ambulatory Visit
Admission: RE | Admit: 2023-05-21 | Discharge: 2023-05-21 | Disposition: A | Discharge status: Home / Self Care | Payer: BC Managed Care – PPO | Attending: Family Medicine | Admitting: Family Medicine

## 2023-05-21 ENCOUNTER — Encounter: Payer: Self-pay | Admitting: Family Medicine

## 2023-05-21 VITALS — BP 144/61 | HR 39 | Temp 98.6°F | Resp 16 | Ht 67.01 in | Wt 212.4 lb

## 2023-05-21 DIAGNOSIS — M79642 Pain in left hand: Secondary | ICD-10-CM | POA: Insufficient documentation

## 2023-05-21 DIAGNOSIS — M25572 Pain in left ankle and joints of left foot: Secondary | ICD-10-CM | POA: Insufficient documentation

## 2023-05-21 DIAGNOSIS — M79641 Pain in right hand: Secondary | ICD-10-CM

## 2023-05-21 MED ORDER — PREDNISONE 10 MG PO TABS: ORAL_TABLET | ORAL | 0 refills | Status: DC

## 2023-05-21 NOTE — Telephone Encounter (Signed)
No longer current dosing of this medication  Requested Prescriptions  Pending Prescriptions Disp Refills   hydrochlorothiazide (HYDRODIURIL) 12.5 MG tablet [Pharmacy Med Name: HYDROCHLOROTHIAZIDE 12.5 MG TAB] 90 tablet 1    Sig: TAKE ONE TABLET BY MOUTH EVERY DAY     Cardiovascular: Diuretics - Thiazide Failed - 05/21/2023 11:10 AM      Failed - Last BP in normal range    BP Readings from Last 1 Encounters:  05/21/23 (!) 144/61         Passed - Cr in normal range and within 180 days    Creatinine, Ser  Date Value Ref Range Status  04/15/2023 1.08 0.76 - 1.27 mg/dL Final         Passed - K in normal range and within 180 days    Potassium  Date Value Ref Range Status  04/15/2023 4.0 3.5 - 5.2 mmol/L Final         Passed - Na in normal range and within 180 days    Sodium  Date Value Ref Range Status  04/15/2023 141 134 - 144 mmol/L Final         Passed - Valid encounter within last 6 months    Recent Outpatient Visits           1 month ago High cholesterol   [place] [facility] [place], Megan P, DO   7 months ago Routine general medical examination at a health care facility   [facility] [place], [place], DO   1 year ago Former smoker   [place] [facility] [place], [place], DO   1 year ago Pneumonia of right upper lobe due to infectious organism   [place] [facility] [place], Megan P, DO   1 year ago High cholesterol   [place] [facility] [place], [place], DO

## 2023-05-21 NOTE — Progress Notes (Signed)
BP (!) 144/61 (BP Location: Right Arm, Patient Position: Sitting, Cuff Size: Large)   Pulse (!) 39   Temp 98.6 F (37 C) (Oral)   Resp 16   Ht 5' 7.01" (1.702 m)   Wt 212 lb 6.4 oz (96.3 kg)   SpO2 96%   BMI 33.26 kg/m    Subjective:    Patient ID: Daniel Cook, male    DOB: 1955/10/16, 68 y.o.   MRN: [id number]  HPI: Daniel Cook is a 68 y.o. male  Chief Complaint  Patient presents with   Gout    Left foot/ankle, great toe 3/10 and b/l thumbs 3/10   FOOT PAIN Duration: about 6 weeks Involved foot: left Mechanism of injury: unknown Location: ankle mortis into his foot Onset: sudden  Severity: severe  Quality:  stabbing, vice, tight Frequency: intermittent Radiation: no Aggravating factors: weight bearing and walking  Alleviating factors: elevation, rest  Status: fluctuating Treatments attempted:  colchicine, none, rest, ice, and heat  Relief with NSAIDs?:  no Weakness with weight bearing or walking: no Morning stiffness: yes Swelling: yes Redness: no Bruising: no Paresthesias / decreased sensation: no  Fevers:no  Bases of both thumbs have also been hurting in the past 6 weeks or so.  Relevant past medical, surgical, family and social history reviewed and updated as indicated. Interim medical history since our last visit reviewed. Allergies and medications reviewed and updated.  Review of Systems  Constitutional: Negative.   Respiratory: Negative.    Cardiovascular: Negative.   Musculoskeletal:  Positive for arthralgias and joint swelling. Negative for back pain, gait problem, myalgias, neck pain and neck stiffness.  Skin: Negative.   Psychiatric/Behavioral: Negative.      Per HPI unless specifically indicated above     Objective:    BP (!) 144/61 (BP Location: Right Arm, Patient Position: Sitting, Cuff Size: Large)   Pulse (!) 39   Temp 98.6 F (37 C) (Oral)   Resp 16   Ht 5' 7.01" (1.702 m)   Wt 212 lb 6.4 oz (96.3 kg)   SpO2 96%    BMI 33.26 kg/m   Wt Readings from Last 3 Encounters:  05/21/23 212 lb 6.4 oz (96.3 kg)  04/15/23 206 lb 12.8 oz (93.8 kg)  10/08/22 205 lb (93 kg)    Physical Exam Vitals and nursing note reviewed.  Constitutional:      General: He is not in acute distress.    Appearance: Normal appearance. He is not ill-appearing, toxic-appearing or diaphoretic.  HENT:     Head: Normocephalic and atraumatic.     Right Ear: External ear normal.     Left Ear: External ear normal.     Nose: Nose normal.     Mouth/Throat:     Mouth: Mucous membranes are moist.     Pharynx: Oropharynx is clear.  Eyes:     General: No scleral icterus.       Right eye: No discharge.        Left eye: No discharge.     Extraocular Movements: Extraocular movements intact.     Conjunctiva/sclera: Conjunctivae normal.     Pupils: Pupils are equal, round, and reactive to light.  Cardiovascular:     Rate and Rhythm: Normal rate and regular rhythm.     Pulses: Normal pulses.     Heart sounds: Normal heart sounds. No murmur heard.    No friction rub. No gallop.  Pulmonary:     Effort: Pulmonary effort is normal.  No respiratory distress.     Breath sounds: Normal breath sounds. No stridor. No wheezing, rhonchi or rales.  Chest:     Chest wall: No tenderness.  Musculoskeletal:        General: Tenderness present. No swelling, deformity or signs of injury. Normal range of motion.     Cervical back: Normal range of motion and neck supple.     Right lower leg: No edema.     Left lower leg: No edema.  Skin:    General: Skin is warm and dry.     Capillary Refill: Capillary refill takes less than 2 seconds.     Coloration: Skin is not jaundiced or pale.     Findings: No bruising, erythema, lesion or rash.  Neurological:     General: No focal deficit present.     Mental Status: He is alert and oriented to person, place, and time. Mental status is at baseline.  Psychiatric:        Mood and Affect: Mood normal.         Behavior: Behavior normal.        Thought Content: Thought content normal.        Judgment: Judgment normal.     Results for orders placed or performed in visit on 04/15/23  Bayer DCA Hb A1c Waived   Collection Time: 04/15/23  8:15 AM  Result Value Ref Range   HB A1C (BAYER DCA - WAIVED) 5.6 4.8 - 5.6 %  CBC with Differential/Platelet   Collection Time: 04/15/23  8:18 AM  Result Value Ref Range   WBC 7.8 3.4 - 10.8 x10E3/uL   RBC 4.08 (L) 4.14 - 5.80 x10E6/uL   Hemoglobin 13.0 13.0 - 17.7 g/dL   Hematocrit 16.1 09.6 - 51.0 %   MCV 96 79 - 97 fL   MCH 31.9 26.6 - 33.0 pg   MCHC 33.1 31.5 - 35.7 g/dL   RDW 04.5 40.9 - 81.1 %   Platelets 230 150 - 450 x10E3/uL   Neutrophils 66 Not Estab. %   Lymphs 20 Not Estab. %   Monocytes 9 Not Estab. %   Eos 4 Not Estab. %   Basos 1 Not Estab. %   Neutrophils Absolute 5.2 1.4 - 7.0 x10E3/uL   Lymphocytes Absolute 1.6 0.7 - 3.1 x10E3/uL   Monocytes Absolute 0.7 0.1 - 0.9 x10E3/uL   EOS (ABSOLUTE) 0.3 0.0 - 0.4 x10E3/uL   Basophils Absolute 0.1 0.0 - 0.2 x10E3/uL   Immature Granulocytes 0 Not Estab. %   Immature Grans (Abs) 0.0 0.0 - 0.1 x10E3/uL  Comprehensive metabolic panel   Collection Time: 04/15/23  8:18 AM  Result Value Ref Range   Glucose 105 (H) 70 - 99 mg/dL   BUN 25 8 - 27 mg/dL   Creatinine, Ser 9.14 0.76 - 1.27 mg/dL   eGFR 76 >78 GN/FAO/1.30   BUN/Creatinine Ratio 23 10 - 24   Sodium 141 134 - 144 mmol/L   Potassium 4.0 3.5 - 5.2 mmol/L   Chloride 103 96 - 106 mmol/L   CO2 23 20 - 29 mmol/L   Calcium 9.3 8.6 - 10.2 mg/dL   Total Protein 6.3 6.0 - 8.5 g/dL   Albumin 4.4 3.9 - 4.9 g/dL   Globulin, Total 1.9 1.5 - 4.5 g/dL   Bilirubin Total 0.6 0.0 - 1.2 mg/dL   Alkaline Phosphatase 64 44 - 121 IU/L   AST 21 0 - 40 IU/L   ALT 20 0 - 44 IU/L  Lipid Panel w/o Chol/HDL Ratio   Collection Time: 04/15/23  8:18 AM  Result Value Ref Range   Cholesterol, Total 168 100 - 199 mg/dL   Triglycerides 161 0 - 149 mg/dL   HDL  58 >09 mg/dL   VLDL Cholesterol Cal 20 5 - 40 mg/dL   LDL Chol Calc (NIH) 90 0 - 99 mg/dL  Uric acid   Collection Time: 04/15/23  8:18 AM  Result Value Ref Range   Uric Acid 4.6 3.8 - 8.4 mg/dL      Assessment & Plan:   Problem List Items Addressed This Visit   None Visit Diagnoses       Acute left ankle pain    -  Primary   Doubt gout given normal uric acid and no improvement with colchicine. Will treat with prednisone, recheck labs and x-ray. Await results. Call with concerns.   Relevant Orders   Comprehensive metabolic panel   Uric acid   DG Foot Complete Left   DG Ankle Complete Left     Bilateral hand pain       Concern for OA. Will check x-ray and prednisone. Call with any concerns. Continue to monitor.   Relevant Orders   DG Hand Complete Left   DG Hand Complete Right        Follow up plan: Return if symptoms worsen or fail to improve.

## 2023-05-22 LAB — COMPREHENSIVE METABOLIC PANEL
ALT: 20 [IU]/L (ref 0–44)
AST: 23 [IU]/L (ref 0–40)
Albumin: 4.2 g/dL (ref 3.9–4.9)
Alkaline Phosphatase: 59 [IU]/L (ref 44–121)
BUN/Creatinine Ratio: 24 (ref 10–24)
BUN: 24 mg/dL (ref 8–27)
Bilirubin Total: 0.6 mg/dL (ref 0.0–1.2)
CO2: 26 mmol/L (ref 20–29)
Calcium: 9.2 mg/dL (ref 8.6–10.2)
Chloride: 104 mmol/L (ref 96–106)
Creatinine, Ser: 1.01 mg/dL (ref 0.76–1.27)
Globulin, Total: 2.2 g/dL (ref 1.5–4.5)
Glucose: 112 mg/dL — ABNORMAL HIGH (ref 70–99)
Potassium: 4.3 mmol/L (ref 3.5–5.2)
Sodium: 140 mmol/L (ref 134–144)
Total Protein: 6.4 g/dL (ref 6.0–8.5)
eGFR: 83 mL/min/{1.73_m2} (ref >59)

## 2023-05-22 LAB — URIC ACID: Uric Acid: 4.3 mg/dL (ref 3.8–8.4)

## 2023-05-23 ENCOUNTER — Encounter: Payer: Self-pay | Admitting: Family Medicine

## 2023-05-23 ENCOUNTER — Other Ambulatory Visit: Payer: Self-pay | Admitting: Family Medicine

## 2023-05-23 DIAGNOSIS — M19072 Primary osteoarthritis, left ankle and foot: Secondary | ICD-10-CM

## 2023-05-23 DIAGNOSIS — M180 Bilateral primary osteoarthritis of first carpometacarpal joints: Secondary | ICD-10-CM

## 2023-06-11 ENCOUNTER — Telehealth: Payer: Self-pay | Admitting: Cardiovascular Disease

## 2023-06-11 NOTE — Telephone Encounter (Signed)
 Called patient, advised that for the last 1-2 weeks he has been having issues with his blood pressure. It will be elevated mostly 167/80's.. today it was 167/82, yesterday it was 167/80. One day last week it was 180/92. He states his HR stays in the 50's. He does not have any symptoms at this time, only mentions being tired and having fatigue. He does currently take his Amlodipine 2.5 mg, and the Losartan 100 mg- he states these blood pressure readings are after his medications. He takes his medications every morning, and he normally checks his blood pressure in the evenings. He did recently have his blood pressure monitor looked at by his PCP and it is accurate.   I scheduled him for a follow up with NP 03/26- as he was coming due for a one year follow up.   However, advised that we would see what Dr.Gollan would recommend as we wait for upcoming appointment. Advised we would call back with those recommendation.   Patient aware to bring BP/HR log to his upcoming visit.

## 2023-06-11 NOTE — Telephone Encounter (Signed)
 Pt c/o BP issue: STAT if pt c/o blurred vision, one-sided weakness or slurred speech  1. What are your last 5 BP readings? 167/90 Today, 180/92 Yesterday, 140/82 Monday   2. Are you having any other symptoms (ex. Dizziness, headache, blurred vision, passed out)? A little fatigue  3. What is your BP issue? Pt stated he has concerns about his bp being higher lately and is requesting a callback to discuss. Please advise.

## 2023-06-12 MED ORDER — AMLODIPINE BESYLATE 10 MG PO TABS
10.0000 mg | ORAL_TABLET | Freq: Every day | ORAL | 3 refills | Status: DC
Start: 2023-06-12 — End: 2023-12-03

## 2023-06-12 NOTE — Telephone Encounter (Signed)
 Called patient and notified him of the following from Dr. Mariah Milling.  Would recommend for blood pressure we try higher dose of amlodipine Increase amlodipine up to 10 mg daily Thx TGollan   Patient verbalizes understanding. Prescription sent to preferred pharmacy.

## 2023-07-01 NOTE — Progress Notes (Unsigned)
 Cardiology Clinic Note   Date: 07/02/2023 ID: Burrell, Hodapp 27-Apr-1955, MRN [id number]  Primary Cardiologist:  Julien Nordmann, MD  Chief Complaint   Daniel Cook is a 68 y.o. male who presents to the clinic today for evaluation of elevated BP.   Patient Profile   Daniel Cook is followed by Dr. Mariah Milling for the history outlined below.      Past medical history significant for: Bradycardia/lightheadedness. Echo 06/20/2020: EF 60 to 65%.  No RWMA.  Grade I DD.  Normal global strain.  Normal RV size/function.  Mild AI. 14-day ZIO 07/14/2020: HR 37 to 182 bpm, average 54 bpm.  12 runs of SVT/A. tach fastest lasting 11 beats with max rate 182 bpm, longest 14 seconds average rate 114 bpm.  Rare ectopy. Hypertension. Hyperlipidemia. CT cardiac scoring 05/30/2020: Calcium score 185 (73rd percentile).  LAD 85, LCx 4.23, RCA 95.5. Lipid panel 04/15/2023: LDL 90, HDL 58, TG 110, total 168. OSA. On CPAP near 100% adherence.  COPD. T2DM. Tobacco abuse.  In summary, patient was previously followed by Dr. Gwen Pounds.  He established care with Dr. Mariah Milling on 08/16/2019 for second opinion regarding pacemaker implantation.  Patient was bradycardic at the time of his visit with HR 46 bpm.  He was asymptomatic with adequate blood pressure and no further testing was indicated.  He underwent echo and CT cardiac scoring as detailed above.  He complained of some intermittent lightheadedness and vertigo.  He underwent 14-day ZIO which showed bradycardia as well as runs of SVT/A. tach and he was referred to EP.  He was evaluated by Dr. Lalla Brothers on 08/16/2020.  Conservative management of bradycardia was recommended.  Patient asymptomatic with A. tach and no medical therapy was warranted.  He complained of a nagging precordial chest pain and nuclear stress test was recommended.  Unfortunately ischemic testing was) but HFrEF patient and medical management was pursued.  Patient was last seen in the office by Dr.  Mariah Milling on 08/12/2022 with complaints of chest pain and left shoulder tingling radiating down left arm.  It was felt pain was nerve related.  No further testing was ordered.  Patient contacted the office on 06/11/2023 with complaints of elevated BP.  Patient reported BP readings mostly in the 160s/80s with 1 reading as high as 180/92.  Dr. Mariah Milling recommended increasing amlodipine to 10 mg daily.     History of Present Illness    Today, patient reports elevated BP starting in the beginning of March when he got a reading of 192/93.  Readings have consistently been > 130/80 despite increase in amlodipine.  BP range 140-180/60-80.  Patient reports he does not necessarily have a headache but does feel some global head pressure and lightheadedness.  No vision changes.  He reports typically restrict sodium rinsing canned vegetables before cooking them and not adding salt to his foods.  This winter he did consume more canned soup than usual as the temperatures are so cold.  He denies chest pain, shortness of breath, lower extremity edema, orthopnea, PND.  He is adherent to CPAP.  He works at a [employer].  He does not follow a regular exercise program.    ROS: All other systems reviewed and are otherwise negative except as noted in History of Present Illness.  EKGs/Labs Reviewed    EKG Interpretation Date/Time:  Wednesday July 02 2023 15:24:00 EDT Ventricular Rate:  49 PR Interval:  160 QRS Duration:  80 QT Interval:  404 QTC Calculation: 364 R  Axis:   42  Text Interpretation: Sinus bradycardia When compared with ECG of 26-May-2020 23:02, No significant change was found Confirmed by Carlos Levering [phone]) on 07/02/2023 3:33:06 PM   05/21/2023: ALT 20; AST 23; BUN 24; Creatinine, Ser 1.01; Potassium 4.3; Sodium 140   04/15/2023: Hemoglobin 13.0; WBC 7.8   10/08/2022: TSH 1.370    Physical Exam    VS:  BP 122/60 (BP Location: Left Arm, Patient Position: Sitting, Cuff Size: Normal)   Pulse (!) 49    Ht 5\' 8"  (1.727 m)   Wt 218 lb 4 oz (99 kg)   SpO2 96%   BMI 33.18 kg/m  , BMI Body mass index is 33.18 kg/m.  Orthostatic VS for the past 24 hrs (Last 3 readings):  BP- Lying Pulse- Lying BP- Sitting Pulse- Sitting BP- Standing at 0 minutes Pulse- Standing at 0 minutes BP- Standing at 3 minutes Pulse- Standing at 3 minutes  07/02/23 1525 134/64 (!) 49 133/67 (!) 49 126/64 54 155/69 54     GEN: Well nourished, well developed, in no acute distress. Neck: No JVD or carotid bruits. Cardiac:  RRR. No murmurs. No rubs or gallops.   Respiratory:  Respirations regular and unlabored. Clear to auscultation without rales, wheezing or rhonchi. GI: Soft, nontender, nondistended. Extremities: Radials/DP/PT 2+ and equal bilaterally. No clubbing or cyanosis. No edema   Skin: Warm and dry, no rash. Neuro: Strength intact.  Assessment & Plan   Bradycardia/PSVT 14-day ZIO April 2022 showed HR 37 to 182 bpm, average 54 bpm, 12 runs of SVT/A. tach fastest lasting 11 beats max rate 182 bpm, longest 14 seconds average rate 114 bpm, rare ectopy.  Patient reports some lightheadedness that he relates to elevated BP.  No palpitations. -Continue to monitor.  Hypertension BP today 122/60.  Home BP range 140-180/60-80.  Patient reports no headaches but global head pressure and lightheadedness.  He is not orthostatic today. -Continue amlodipine, hydrochlorothiazide. -Stop losartan and start olmesartan 20 mg daily. -Continue BP log.  Hyperlipidemia LDL 90, TG 110 January 2025. -Continue atorvastatin, Zetia, Vascepa.  Disposition: Stop losartan and start olmesartan 20 mg daily.  Return in 4 weeks or sooner as needed         Signed, Etta Grandchild. Layli Capshaw, DNP, NP-C

## 2023-07-02 ENCOUNTER — Ambulatory Visit: Attending: Student | Admitting: Student

## 2023-07-02 ENCOUNTER — Encounter: Payer: Self-pay | Admitting: Student

## 2023-07-02 VITALS — BP 122/60 | HR 49 | Ht 68.0 in | Wt 218.2 lb

## 2023-07-02 DIAGNOSIS — I10 Essential (primary) hypertension: Secondary | ICD-10-CM

## 2023-07-02 DIAGNOSIS — R001 Bradycardia, unspecified: Secondary | ICD-10-CM

## 2023-07-02 DIAGNOSIS — I4719 Other supraventricular tachycardia: Secondary | ICD-10-CM | POA: Diagnosis not present

## 2023-07-02 DIAGNOSIS — M1712 Unilateral primary osteoarthritis, left knee: Secondary | ICD-10-CM | POA: Insufficient documentation

## 2023-07-02 DIAGNOSIS — I471 Supraventricular tachycardia, unspecified: Secondary | ICD-10-CM | POA: Diagnosis not present

## 2023-07-02 DIAGNOSIS — E7800 Pure hypercholesterolemia, unspecified: Secondary | ICD-10-CM

## 2023-07-02 MED ORDER — OLMESARTAN MEDOXOMIL 20 MG PO TABS: 20.0000 mg | ORAL_TABLET | Freq: Every day | ORAL | 3 refills | Status: DC

## 2023-07-02 NOTE — Patient Instructions (Addendum)
 Medication Instructions:  Your physician recommends the following medication changes.  STOP TAKING: Losartan  START TAKING: Olmesartan 20 mg daily  *If you need a refill on your cardiac medications before your next appointment, please call your pharmacy*   Lab Work: None ordered at this time    Follow-Up: At [facility], you and your health needs are our priority.  As part of our continuing mission to provide you with exceptional heart care, we have created designated Provider Care Teams.  These Care Teams include your primary Cardiologist (physician) and Advanced Practice Providers (APPs -  Physician Assistants and Nurse Practitioners) who all work together to provide you with the care you need, when you need it.   Your next appointment:   4- 5 week(s) -- Please bring your BP cuff with you next time so we can check it   Provider:   You may see Julien Nordmann, MD or Carlos Levering, NP

## 2023-08-08 ENCOUNTER — Other Ambulatory Visit: Payer: Self-pay | Admitting: Family Medicine

## 2023-08-08 NOTE — Progress Notes (Deleted)
 Cardiology Clinic Note   Date: 08/08/2023 ID: ZO DINOVO, DOB [birth year], MRN [id number]  Primary Cardiologist:  Belva Boyden, MD  Chief Complaint   Daniel Cook is a 68 y.o. male who presents to the clinic today for ***  Patient Profile   Daniel Cook is followed by *** for the history outlined below.       Past medical history significant for: Bradycardia/lightheadedness. Echo 06/20/2020: EF 60 to 65%.  No RWMA.  Grade I DD.  Normal global strain.  Normal RV size/function.  Mild AI. 14-day ZIO 07/14/2020: HR 37 to 182 bpm, average 54 bpm.  12 runs of SVT/A. tach fastest lasting 11 beats with max rate 182 bpm, longest 14 seconds average rate 114 bpm.  Rare ectopy. Hypertension. Hyperlipidemia. CT cardiac scoring 05/30/2020: Calcium  score 185 (73rd percentile).  LAD 85, LCx 4.23, RCA 95.5. Lipid panel 04/15/2023: LDL 90, HDL 58, TG 110, total 168. OSA. On CPAP near 100% adherence.  COPD. T2DM. Tobacco abuse.  In summary, patient was previously followed by Dr. Bary Likes.  He established care with Dr. Gollan on 08/16/2019 for second opinion regarding pacemaker implantation.  Patient was bradycardic at the time of his visit with HR 46 bpm.  He was asymptomatic with adequate blood pressure and no further testing was indicated.  He underwent echo and CT cardiac scoring as detailed above.  He complained of some intermittent lightheadedness and vertigo.  He underwent 14-day ZIO which showed bradycardia as well as runs of SVT/A. tach and he was referred to EP.  He was evaluated by Dr. Marven Slimmer on 08/16/2020.  Conservative management of bradycardia was recommended.  Patient asymptomatic with A. tach and no medical therapy was warranted.  He complained of a nagging precordial chest pain and nuclear stress test was recommended.  Unfortunately ischemic testing was) but HFrEF patient and medical management was pursued.  Patient was last seen in the office by Dr. Gollan on 08/12/2022 with  complaints of chest pain and left shoulder tingling radiating down left arm.  It was felt pain was nerve related.  No further testing was ordered.  Patient contacted the office on 06/11/2023 with complaints of elevated BP.  Patient reported BP readings mostly in the 160s/80s with 1 reading as high as 180/92.  Dr. Gollan recommended increasing amlodipine  to 10 mg daily.   Patient was last seen in the office by me on 07/02/2023 for elevated BP.  He reported BP readings consistently high despite increase in amlodipine  ranging from 140-180/60-80.  BP at the time of his visit was well-controlled.  He was instructed to stop losartan  and start olmesartan .     History of Present Illness    Today, patient ***  Bradycardia/PSVT 14-day ZIO April 2022 showed HR 37 to 182 bpm, average 54 bpm, 12 runs of SVT/A. tach fastest lasting 11 beats max rate 182 bpm, longest 14 seconds average rate 114 bpm, rare ectopy.  Patient reports some lightheadedness that he relates to elevated BP.  No palpitations.*** -Continue to monitor.   Hypertension BP today***.  Home BP***.  Patient reports no headaches but global head pressure and lightheadedness.  He is not orthostatic today.*** -Continue amlodipine , hydrochlorothiazide , olmesartan . - BMP today***   Hyperlipidemia LDL 90, TG 110 January 2025. -Continue atorvastatin , Zetia , Vascepa .***  ROS: All other systems reviewed and are otherwise negative except as noted in History of Present Illness.  EKGs/Labs Reviewed        05/21/2023: ALT 20; AST 23; BUN 24;  Creatinine, Ser 1.01; Potassium 4.3; Sodium 140   04/15/2023: Hemoglobin 13.0; WBC 7.8   10/08/2022: TSH 1.370   No results found for requested labs within last 365 days.  ***  Risk Assessment/Calculations    {Does this patient have ATRIAL FIBRILLATION?:[id number]} No BP recorded.  {Refresh Note OR Click here to enter BP  :1}***        Physical Exam    VS:  There were no vitals taken for this visit. ,  BMI There is no height or weight on file to calculate BMI.  GEN: Well nourished, well developed, in no acute distress. Neck: No JVD or carotid bruits. Cardiac: *** RRR. No murmurs. No rubs or gallops.   Respiratory:  Respirations regular and unlabored. Clear to auscultation without rales, wheezing or rhonchi. GI: Soft, nontender, nondistended. Extremities: Radials/DP/PT 2+ and equal bilaterally. No clubbing or cyanosis. No edema ***  Skin: Warm and dry, no rash. Neuro: Strength intact.  Assessment & Plan   ***  Disposition: ***     {Are you ordering a CV Procedure (e.g. stress test, cath, DCCV, TEE, etc)?   Press F2        :[id number]}   Signed, Lonell Rives. Devetta Hagenow, DNP, NP-C

## 2023-08-11 ENCOUNTER — Ambulatory Visit: Attending: Student | Admitting: Student

## 2023-08-11 NOTE — Telephone Encounter (Signed)
 Rx 04/15/23 #90 1RF- too soon Requested Prescriptions  Pending Prescriptions Disp Refills   hydrochlorothiazide  (HYDRODIURIL ) 25 MG tablet [Pharmacy Med Name: HYDROCHLOROTHIAZIDE  25 MG TAB] 90 tablet 1    Sig: TAKE ONE TABLET BY MOUTH ONCE DAILY     Cardiovascular: Diuretics - Thiazide Passed - 08/11/2023  3:53 PM      Passed - Cr in normal range and within 180 days    Creatinine, Ser  Date Value Ref Range Status  05/21/2023 1.01 0.76 - 1.27 mg/dL Final         Passed - K in normal range and within 180 days    Potassium  Date Value Ref Range Status  05/21/2023 4.3 3.5 - 5.2 mmol/L Final         Passed - Na in normal range and within 180 days    Sodium  Date Value Ref Range Status  05/21/2023 140 134 - 144 mmol/L Final         Passed - Last BP in normal range    BP Readings from Last 1 Encounters:  07/02/23 122/60         Passed - Valid encounter within last 6 months    Recent Outpatient Visits           2 months ago Acute left ankle pain   [facility] Annetta [place], [place], DO

## 2023-08-26 LAB — HM DIABETES EYE EXAM

## 2023-08-29 ENCOUNTER — Ambulatory Visit: Admitting: Student

## 2023-08-30 NOTE — Progress Notes (Unsigned)
 Cardiology Clinic Note   Date: 09/02/2023 ID: Cook, Daniel Cook 20, 1957, MRN [id number]  Primary Cardiologist:  Belva Boyden, MD  Chief Complaint   DONTAI Cook is a 68 y.o. male who presents to the clinic today for follow up after medication changes.   Patient Profile   Daniel Cook is followed by Dr. Gollan for the history outlined below.       Past medical history significant for: Bradycardia/lightheadedness. Echo 06/20/2020: EF 60 to 65%.  No RWMA.  Grade I DD.  Normal global strain.  Normal RV size/function.  Mild AI. 14-day ZIO 07/14/2020: HR 37 to 182 bpm, average 54 bpm.  12 runs of SVT/A. tach fastest lasting 11 beats with max rate 182 bpm, longest 14 seconds average rate 114 bpm.  Rare ectopy. Hypertension. Hyperlipidemia. CT cardiac scoring 05/30/2020: Calcium  score 185 (73rd percentile).  LAD 85, LCx 4.23, RCA 95.5. Lipid panel 04/15/2023: LDL 90, HDL 58, TG 110, total 168. OSA. On CPAP near 100% adherence.  COPD. T2DM. Tobacco abuse.  In summary, patient was previously followed by Dr. Bary Cook.  He established care with Dr. Gollan on 08/16/2019 for second opinion regarding pacemaker implantation.  Patient was bradycardic at the time of his visit with HR 46 bpm.  He was asymptomatic with adequate blood pressure and no further testing was indicated.  He underwent echo and CT cardiac scoring as detailed above.  He complained of some intermittent lightheadedness and vertigo.  He underwent 14-day ZIO which showed bradycardia as well as runs of SVT/A. tach and he was referred to EP.  He was evaluated by Dr. Marven Slimmer on 08/16/2020.  Conservative management of bradycardia was recommended.  Patient asymptomatic with A. tach and no medical therapy was warranted.  He complained of a nagging precordial chest pain and nuclear stress test was recommended.  Unfortunately ischemic testing was) but HFrEF patient and medical management was pursued.  Patient was last seen in the  office by Dr. Gollan on 08/12/2022 with complaints of chest pain and left shoulder tingling radiating down left arm.  It was felt pain was nerve related.  No further testing was ordered.  Patient contacted the office on 06/11/2023 with complaints of elevated BP.  Patient reported BP readings mostly in the 160s/80s with 1 reading as high as 180/92.  Dr. Gollan recommended increasing amlodipine  to 10 mg daily.   Patient was last seen in the office by me on 07/02/2023 for elevated BP.  He reported BP readings consistently high despite increase in amlodipine  ranging from 140-180/60-80.  BP at the time of his visit was well-controlled.  He was instructed to stop losartan  and start olmesartan .     History of Present Illness    Today, patient reports he was unable to take olmesartan , as he experienced lower extremity edema. He self discontinued the medication and restarted Losartan . He has continued to check his BP every 2-3 nights with BP readings 148-158/70-80. No headaches, dizziness or vision changes. He has his home BP cuff with him today. There is a 10 point discrepancy with both systolic and diastolic readings. He denies shortness of breath, dyspnea on exertion, orthopnea or PND. He reports occasional lower extremity edema that he attributes to working on concrete flooring. He reports legs are normal in size upon awakening. No chest pain, pressure, or tightness. No palpitations.  He stays active playing with his grandchildren.     ROS: All other systems reviewed and are otherwise negative except as noted in  History of Present Illness.  EKGs/Labs Reviewed       EKG is not performed today.   05/21/2023: ALT 20; AST 23; BUN 24; Creatinine, Ser 1.01; Potassium 4.3; Sodium 140   04/15/2023: Hemoglobin 13.0; WBC 7.8   10/08/2022: TSH 1.370    Risk Assessment/Calculations      HYPERTENSION CONTROL Vitals:   09/02/23 1334 09/02/23 1603  BP: (!) 140/78 (!) 140/64    The patient's blood pressure is  elevated above target today.  In order to address the patient's elevated BP: A new medication was prescribed today.           Physical Exam    VS:  BP (!) 140/64 (BP Location: Left Arm, Patient Position: Sitting, Cuff Size: Normal)   Pulse (!) 55   Ht 5\' 8"  (1.727 m)   Wt 215 lb (97.5 kg)   SpO2 94%   BMI 32.69 kg/m  , BMI Body mass index is 32.69 kg/m.  GEN: Well nourished, well developed, in no acute distress. Neck: No JVD or carotid bruits. Cardiac:  RRR. No murmurs. No rubs or gallops.   Respiratory:  Respirations regular and unlabored. Clear to auscultation without rales, wheezing or rhonchi. GI: Soft, nontender, nondistended. Extremities: Radials/DP/PT 2+ and equal bilaterally. No clubbing or cyanosis. Mild, nonpitting edema bilateral lower extremities.   Skin: Warm and dry, no rash. Neuro: Strength intact.  Assessment & Plan   Bradycardia/PSVT 14-day ZIO April 2022 showed HR 37 to 182 bpm, average 54 bpm, 12 runs of SVT/A. tach fastest lasting 11 beats max rate 182 bpm, longest 14 seconds average rate 114 bpm, rare ectopy.  Patient denies lightheadedness or dizziness.  No palpitations. HR today 55 bpm.  -Continue to monitor.   Hypertension BP today 140/78 on intake and 140/64 on my recheck.  Home BP checked every 2-3 nights 148-158/70-80. He brings his home BP cuff today and it shows a 10 point discrepancy with both systolic and diastolic readings. Patient reports no headaches, lightheadedness or dizziness. He was unable to take olmesartan  secondary to lower extremity edema. He self discontinued the medication and restarted losartan . Discussed BP goal of 130/80.  -Continue amlodipine , hydrochlorothiazide , losartan . - Add spironolactone 12.5 mg daily.  - BMP today and repeat BMP in 1.5 weeks.    Hyperlipidemia LDL 90, TG 110 January 2025. -Continue atorvastatin , Zetia , Vascepa .  Disposition: Add spironolactone 12.5 mg daily. BMP today. Repeat BMP in 1.5 weeks. Return  in 3 months or sooner as needed.          Signed, Lonell Rives. Adelynne Joerger, DNP, NP-C

## 2023-09-02 ENCOUNTER — Ambulatory Visit: Attending: Student | Admitting: Student

## 2023-09-02 ENCOUNTER — Encounter: Payer: Self-pay | Admitting: Student

## 2023-09-02 VITALS — BP 140/64 | HR 55 | Ht 68.0 in | Wt 215.0 lb

## 2023-09-02 DIAGNOSIS — Z79899 Other long term (current) drug therapy: Secondary | ICD-10-CM

## 2023-09-02 DIAGNOSIS — I471 Supraventricular tachycardia, unspecified: Secondary | ICD-10-CM

## 2023-09-02 DIAGNOSIS — R001 Bradycardia, unspecified: Secondary | ICD-10-CM

## 2023-09-02 DIAGNOSIS — I10 Essential (primary) hypertension: Secondary | ICD-10-CM

## 2023-09-02 DIAGNOSIS — E782 Mixed hyperlipidemia: Secondary | ICD-10-CM

## 2023-09-02 MED ORDER — SPIRONOLACTONE 25 MG PO TABS: 12.5000 mg | ORAL_TABLET | Freq: Every day | ORAL | 3 refills | Status: DC

## 2023-09-02 NOTE — Patient Instructions (Signed)
 Medication Instructions:  Your physician recommends the following medication changes.  START TAKING: Spironolactone 12.5 mg (1/2 of 25 mg tablet) once daily   *If you need a refill on your cardiac medications before your next appointment, please call your pharmacy*  Lab Work: Your provider would like for you to have following labs drawn today [facility].    Your provider would like for you to return in One Week to have the following labs drawn: [facility].   Please go to [facility] [address] ([facility] [address], [place] [address] You do not need an appointment.  They are open from 8 am- 4:30 pm.  Lunch from 1:00 pm- 2:00 pm You Do NOT need to be fasting.   You may also go to one of the following [facility]:  [address] [place], [place] [address] Phone: [phone] Lab hours: Mon-Fri 8 am- 5 pm    Lunch 12 pm- 1 pm  [address] [place],  [place]  [address]  [place]  Phone: [phone] Lab hours: 7 am- 4 pm Lunch 12 pm-1 pm   [address] [place],  [place]  [address]  [place]  Phone: [phone] Lab hours: Mon-Fri 8 am- 5 pm    Lunch 12 pm- 1 pm  If you have labs (blood work) drawn today and your tests are completely normal, you will receive your results only by: MyChart Message (if you have MyChart) OR A paper copy in the mail If you have any lab test that is abnormal or we need to change your treatment, we will call you to review the results.  Testing/Procedures: None ordered at this time   Follow-Up: At [facility], you and your health needs are our priority.  As part of our continuing mission to provide you with exceptional heart care, our providers are all part of one team.  This team includes your primary Cardiologist (physician) and Advanced Practice Providers or APPs (Physician Assistants and Nurse Practitioners) who all work together to provide you with the care you need, when you need it.  Your next appointment:   3 month(s)  Provider:    You may see Timothy Gollan, MD or one of the following Advanced Practice Providers on your designated Care Team:   Laneta Pintos, NP Gildardo Labrador, PA-C Varney Gentleman, PA-C Cadence Peoria, PA-C Ronald Cockayne, NP Morey Ar, NP    We recommend signing up for the patient portal called "MyChart".  Sign up information is provided on this After Visit Summary.  MyChart is used to connect with patients for Virtual Visits (Telemedicine).  Patients are able to view lab/test results, encounter notes, upcoming appointments, etc.  Non-urgent messages can be sent to your provider as well.   To learn more about what you can do with MyChart, go to [email or web address].

## 2023-09-03 ENCOUNTER — Ambulatory Visit: Payer: Self-pay | Admitting: Student

## 2023-09-03 DIAGNOSIS — Z79899 Other long term (current) drug therapy: Secondary | ICD-10-CM

## 2023-09-03 LAB — BASIC METABOLIC PANEL WITH GFR
BUN/Creatinine Ratio: 23 (ref 10–24)
BUN: 22 mg/dL (ref 8–27)
CO2: 21 mmol/L (ref 20–29)
Calcium: 9.5 mg/dL (ref 8.6–10.2)
Chloride: 101 mmol/L (ref 96–106)
Creatinine, Ser: 0.96 mg/dL (ref 0.76–1.27)
Glucose: 108 mg/dL — ABNORMAL HIGH (ref 70–99)
Potassium: 4.4 mmol/L (ref 3.5–5.2)
Sodium: 139 mmol/L (ref 134–144)
eGFR: 87 mL/min/{1.73_m2} (ref >59)

## 2023-09-12 ENCOUNTER — Other Ambulatory Visit: Payer: Self-pay | Admitting: *Deleted

## 2023-09-12 ENCOUNTER — Telehealth: Payer: Self-pay | Admitting: Student

## 2023-09-12 DIAGNOSIS — Z79899 Other long term (current) drug therapy: Secondary | ICD-10-CM

## 2023-09-12 NOTE — Telephone Encounter (Signed)
 Patient dropped off a document pertaining to a medication he was prescribed. He is concerned about the side effects and interactions associated with it since he recently experienced lower back pain, etc and would like it reviewed and speak to someone about his concerns highlighted in the document. Placed in RN's box.

## 2023-09-13 LAB — BASIC METABOLIC PANEL WITH GFR
BUN/Creatinine Ratio: 22 (ref 10–24)
BUN: 26 mg/dL (ref 8–27)
CO2: 21 mmol/L (ref 20–29)
Calcium: 9.7 mg/dL (ref 8.6–10.2)
Chloride: 102 mmol/L (ref 96–106)
Creatinine, Ser: 1.19 mg/dL (ref 0.76–1.27)
Glucose: 93 mg/dL (ref 70–99)
Potassium: 5.6 mmol/L — ABNORMAL HIGH (ref 3.5–5.2)
Sodium: 140 mmol/L (ref 134–144)
eGFR: 67 mL/min/{1.73_m2} (ref >59)

## 2023-09-13 NOTE — Progress Notes (Signed)
 MyChart message sent to patient to hold spironolactone  for elevated potassium. He will have repeat BMP on Tuesday 6/10. Reply received from patient voicing understanding of plan.   Lonell Rives. Erminia Mcnew, DNP, NP-C  09/13/2023, 6:42 PM [facility] [address] Office [phone] Fax [phone]

## 2023-09-16 ENCOUNTER — Other Ambulatory Visit: Payer: Self-pay

## 2023-09-16 DIAGNOSIS — Z79899 Other long term (current) drug therapy: Secondary | ICD-10-CM

## 2023-09-17 ENCOUNTER — Ambulatory Visit: Payer: Self-pay | Admitting: Student

## 2023-09-17 LAB — BASIC METABOLIC PANEL WITH GFR
BUN/Creatinine Ratio: 16 (ref 10–24)
BUN: 18 mg/dL (ref 8–27)
CO2: 21 mmol/L (ref 20–29)
Calcium: 9.7 mg/dL (ref 8.6–10.2)
Chloride: 101 mmol/L (ref 96–106)
Creatinine, Ser: 1.12 mg/dL (ref 0.76–1.27)
Glucose: 102 mg/dL — ABNORMAL HIGH (ref 70–99)
Potassium: 4.6 mmol/L (ref 3.5–5.2)
Sodium: 138 mmol/L (ref 134–144)
eGFR: 72 mL/min/{1.73_m2} (ref >59)

## 2023-10-09 ENCOUNTER — Other Ambulatory Visit: Payer: Self-pay | Admitting: Family Medicine

## 2023-10-09 DIAGNOSIS — M1000 Idiopathic gout, unspecified site: Secondary | ICD-10-CM

## 2023-10-09 DIAGNOSIS — E7800 Pure hypercholesterolemia, unspecified: Secondary | ICD-10-CM

## 2023-10-09 DIAGNOSIS — M51369 Other intervertebral disc degeneration, lumbar region without mention of lumbar back pain or lower extremity pain: Secondary | ICD-10-CM

## 2023-10-13 NOTE — Telephone Encounter (Signed)
 Requested Prescriptions  Pending Prescriptions Disp Refills   naproxen  (NAPROSYN ) 500 MG tablet [Pharmacy Med Name: NAPROXEN  500 MG TAB] 180 tablet 0    Sig: TAKE ONE TABLET TWICE DAILY WITH MEALS     Analgesics:  NSAIDS Failed - 10/13/2023  3:33 PM      Failed - Manual Review: Labs are only required if the patient has taken medication for more than 8 weeks.      Passed - Cr in normal range and within 360 days    Creatinine, Ser  Date Value Ref Range Status  09/16/2023 1.12 0.76 - 1.27 mg/dL Final         Passed - HGB in normal range and within 360 days    Hemoglobin  Date Value Ref Range Status  04/15/2023 13.0 13.0 - 17.7 g/dL Final         Passed - PLT in normal range and within 360 days    Platelets  Date Value Ref Range Status  04/15/2023 230 150 - 450 x10E3/uL Final         Passed - HCT in normal range and within 360 days    Hematocrit  Date Value Ref Range Status  04/15/2023 39.3 37.5 - 51.0 % Final         Passed - eGFR is 30 or above and within 360 days    GFR calc Af Amer  Date Value Ref Range Status  06/02/2020 99 >59 mL/min/1.73 Final    Comment:    **In accordance with recommendations from the NKF-ASN Task force,**   [facility] is in the process of updating its eGFR calculation to the   2021 CKD-EPI creatinine equation that estimates kidney function   without a race variable.    GFR, Estimated  Date Value Ref Range Status  05/26/2020 >60 >60 mL/min Final    Comment:    (NOTE) Calculated using the CKD-EPI Creatinine Equation (2021)    GFR calc non Af Amer  Date Value Ref Range Status  06/02/2020 85 >59 mL/min/1.73 Final   eGFR  Date Value Ref Range Status  09/16/2023 72 >59 mL/min/1.73 Final         Passed - Patient is not pregnant      Passed - Valid encounter within last 12 months    Recent Outpatient Visits           4 months ago Acute left ankle pain    [facility] [place], Zap, DO       Future Appointments              In 1 month Gollan, Timothy J, MD  [facility]Care at [facility]             ezetimibe  (ZETIA ) 10 MG tablet [Pharmacy Med Name: EZETIMIBE  10 MG TAB] 90 tablet 0    Sig: TAKE ONE TABLET BY MOUTH ONCE DAILY     Cardiovascular:  Antilipid - Sterol Transport Inhibitors Failed - 10/13/2023  3:33 PM      Failed - Lipid Panel in normal range within the last 12 months    Cholesterol, Total  Date Value Ref Range Status  04/15/2023 168 100 - 199 mg/dL Final   Cholesterol Piccolo, Waived  Date Value Ref Range Status  09/22/2018 212 (H) <200 mg/dL Final    Comment:                            Desirable                <  200                         Borderline High      200- 239                         High                     >239    LDL Chol Calc (NIH)  Date Value Ref Range Status  04/15/2023 90 0 - 99 mg/dL Final   HDL  Date Value Ref Range Status  04/15/2023 58 >39 mg/dL Final   Triglycerides  Date Value Ref Range Status  04/15/2023 110 0 - 149 mg/dL Final   Triglycerides Piccolo,Waived  Date Value Ref Range Status  09/22/2018 192 (H) <150 mg/dL Final    Comment:                            Normal                   <150                         Borderline High     150 - 199                         High                200 - 499                         Very High                >499          Passed - AST in normal range and within 360 days    AST  Date Value Ref Range Status  05/21/2023 23 0 - 40 IU/L Final   AST (SGOT) Piccolo, Waived  Date Value Ref Range Status  09/22/2018 17 11 - 38 U/L Final         Passed - ALT in normal range and within 360 days    ALT  Date Value Ref Range Status  05/21/2023 20 0 - 44 IU/L Final   ALT (SGPT) Piccolo, Waived  Date Value Ref Range Status  09/22/2018 22 10 - 47 U/L Final         Passed - Patient is not pregnant      Passed - Valid encounter within last 12 months    Recent Outpatient Visits           4  months ago Acute left ankle pain   [place] [facility] [place], Megan P, DO       Future Appointments             In 1 month Gollan, Timothy J, MD [facility]

## 2023-10-17 ENCOUNTER — Other Ambulatory Visit: Payer: Self-pay | Admitting: Family Medicine

## 2023-10-17 DIAGNOSIS — M1000 Idiopathic gout, unspecified site: Secondary | ICD-10-CM

## 2023-10-20 NOTE — Telephone Encounter (Signed)
 Requested Prescriptions  Pending Prescriptions Disp Refills   allopurinol  (ZYLOPRIM ) 300 MG tablet [Pharmacy Med Name: ALLOPURINOL  300 MG TAB] 90 tablet 0    Sig: TAKE ONE TABLET BY MOUTH ONCE DAILY     Endocrinology:  Gout Agents - allopurinol  Passed - 10/20/2023  8:16 AM      Passed - Uric Acid in normal range and within 360 days    Uric Acid  Date Value Ref Range Status  05/21/2023 4.3 3.8 - 8.4 mg/dL Final    Comment:               Therapeutic target for gout patients: <6.0         Passed - Cr in normal range and within 360 days    Creatinine, Ser  Date Value Ref Range Status  09/16/2023 1.12 0.76 - 1.27 mg/dL Final         Passed - Valid encounter within last 12 months    Recent Outpatient Visits           5 months ago Acute left ankle pain   [place] [facility] [employer], Allgood, DO       Future Appointments             In 1 month Gollan, Timothy J, MD [place] [facility] - CBC within normal limits and completed in the last 12 months    WBC  Date Value Ref Range Status  04/15/2023 7.8 3.4 - 10.8 x10E3/uL Final  05/26/2020 10.7 (H) 4.0 - 10.5 K/uL Final   RBC  Date Value Ref Range Status  04/15/2023 4.08 (L) 4.14 - 5.80 x10E6/uL Final  05/26/2020 4.15 (L) 4.22 - 5.81 MIL/uL Final   Hemoglobin  Date Value Ref Range Status  04/15/2023 13.0 13.0 - 17.7 g/dL Final   Hematocrit  Date Value Ref Range Status  04/15/2023 39.3 37.5 - 51.0 % Final   MCHC  Date Value Ref Range Status  04/15/2023 33.1 31.5 - 35.7 g/dL Final  97/81/7977 66.0 30.0 - 36.0 g/dL Final   [facility]  Date Value Ref Range Status  04/15/2023 31.9 26.6 - 33.0 pg Final  05/26/2020 31.6 26.0 - 34.0 pg Final   MCV  Date Value Ref Range Status  04/15/2023 96 79 - 97 fL Final   No results found for: PLTCOUNTKUC, LABPLAT, POCPLA RDW  Date Value Ref Range Status  04/15/2023 13.8 11.6 - 15.4 % Final

## 2023-11-04 ENCOUNTER — Other Ambulatory Visit: Payer: Self-pay | Admitting: Family Medicine

## 2023-11-05 NOTE — Telephone Encounter (Signed)
 Requested Prescriptions  Pending Prescriptions Disp Refills   hydrochlorothiazide  (HYDRODIURIL ) 25 MG tablet [Pharmacy Med Name: HYDROCHLOROTHIAZIDE  25 MG TAB] 90 tablet 0    Sig: TAKE ONE TABLET BY MOUTH ONCE DAILY     Cardiovascular: Diuretics - Thiazide Failed - 11/05/2023 10:56 AM      Failed - Last BP in normal range    BP Readings from Last 1 Encounters:  09/02/23 (!) 140/64         Passed - Cr in normal range and within 180 days    Creatinine, Ser  Date Value Ref Range Status  09/16/2023 1.12 0.76 - 1.27 mg/dL Final         Passed - K in normal range and within 180 days    Potassium  Date Value Ref Range Status  09/16/2023 4.6 3.5 - 5.2 mmol/L Final         Passed - Na in normal range and within 180 days    Sodium  Date Value Ref Range Status  09/16/2023 138 134 - 144 mmol/L Final         Passed - Valid encounter within last 6 months    Recent Outpatient Visits           5 months ago Acute left ankle pain   [place] [facility] Wanda, Duwaine SQUIBB, DO       Future Appointments             In 1 month Gollan, Timothy J, MD [facility]

## 2023-12-01 ENCOUNTER — Other Ambulatory Visit: Payer: Self-pay | Admitting: Cardiovascular Disease

## 2023-12-03 ENCOUNTER — Ambulatory Visit: Admitting: Family Medicine

## 2023-12-03 VITALS — BP 128/75 | HR 45 | Temp 98.0°F | Ht 68.0 in | Wt 221.4 lb

## 2023-12-03 DIAGNOSIS — I10 Essential (primary) hypertension: Secondary | ICD-10-CM

## 2023-12-03 DIAGNOSIS — M51369 Other intervertebral disc degeneration, lumbar region without mention of lumbar back pain or lower extremity pain: Secondary | ICD-10-CM

## 2023-12-03 DIAGNOSIS — E118 Type 2 diabetes mellitus with unspecified complications: Secondary | ICD-10-CM | POA: Diagnosis not present

## 2023-12-03 DIAGNOSIS — M1000 Idiopathic gout, unspecified site: Secondary | ICD-10-CM

## 2023-12-03 DIAGNOSIS — Z0000 Encounter for general adult medical examination without abnormal findings: Secondary | ICD-10-CM

## 2023-12-03 DIAGNOSIS — J449 Chronic obstructive pulmonary disease, unspecified: Secondary | ICD-10-CM

## 2023-12-03 DIAGNOSIS — I700 Atherosclerosis of aorta: Secondary | ICD-10-CM

## 2023-12-03 DIAGNOSIS — R600 Localized edema: Secondary | ICD-10-CM

## 2023-12-03 DIAGNOSIS — E7800 Pure hypercholesterolemia, unspecified: Secondary | ICD-10-CM | POA: Diagnosis not present

## 2023-12-03 LAB — MICROALBUMIN, URINE WAIVED
Creatinine, Urine Waived: 50 mg/dL (ref 10–300)
Microalb, Ur Waived: 10 mg/L (ref 0–19)
Microalb/Creat Ratio: <30 mg/g (ref <30)

## 2023-12-03 LAB — BAYER DCA HB A1C WAIVED: HB A1C (BAYER DCA - WAIVED): 6.1 % — ABNORMAL HIGH (ref 4.8–5.6)

## 2023-12-03 MED ORDER — AMLODIPINE BESYLATE 10 MG PO TABS: 10.0000 mg | ORAL_TABLET | Freq: Every day | ORAL | 1 refills | Status: DC

## 2023-12-03 MED ORDER — OMEPRAZOLE 20 MG PO CPDR: 20.0000 mg | DELAYED_RELEASE_CAPSULE | Freq: Every day | ORAL | 1 refills | Status: DC

## 2023-12-03 MED ORDER — ALLOPURINOL 300 MG PO TABS: 300.0000 mg | ORAL_TABLET | Freq: Every day | ORAL | 1 refills | Status: DC

## 2023-12-03 MED ORDER — BREZTRI AEROSPHERE 160-9-4.8 MCG/ACT IN AERO: 2.0000 | INHALATION_SPRAY | Freq: Two times a day (BID) | RESPIRATORY_TRACT | 1 refills | Status: AC

## 2023-12-03 MED ORDER — HYDROCHLOROTHIAZIDE 25 MG PO TABS: 25.0000 mg | ORAL_TABLET | Freq: Every day | ORAL | 1 refills | Status: DC

## 2023-12-03 MED ORDER — COLCHICINE 0.6 MG PO TABS: 0.6000 mg | ORAL_TABLET | Freq: Every day | ORAL | 1 refills | Status: DC | PRN

## 2023-12-03 MED ORDER — GABAPENTIN 300 MG PO CAPS: 600.0000 mg | ORAL_CAPSULE | Freq: Three times a day (TID) | ORAL | 1 refills | Status: DC

## 2023-12-03 MED ORDER — EZETIMIBE 10 MG PO TABS: 10.0000 mg | ORAL_TABLET | Freq: Every day | ORAL | 1 refills | Status: DC

## 2023-12-03 MED ORDER — NAPROXEN 500 MG PO TABS: ORAL_TABLET | ORAL | 1 refills | Status: DC

## 2023-12-03 MED ORDER — ICOSAPENT ETHYL 1 G PO CAPS: 2.0000 g | ORAL_CAPSULE | Freq: Two times a day (BID) | ORAL | 3 refills | Status: AC

## 2023-12-03 MED ORDER — ATORVASTATIN CALCIUM 80 MG PO TABS: 80.0000 mg | ORAL_TABLET | Freq: Every day | ORAL | 1 refills | Status: DC

## 2023-12-03 MED ORDER — OZEMPIC (0.25 OR 0.5 MG/DOSE) 2 MG/3ML ~~LOC~~ SOPN: 0.2500 mg | PEN_INJECTOR | SUBCUTANEOUS | Status: DC

## 2023-12-03 MED ORDER — ALBUTEROL SULFATE HFA 108 (90 BASE) MCG/ACT IN AERS: INHALATION_SPRAY | RESPIRATORY_TRACT | 6 refills | Status: AC

## 2023-12-03 NOTE — Assessment & Plan Note (Signed)
 Under good control on current regimen. Continue current regimen. Continue to monitor. Call with any concerns. Refills given. Labs drawn today.

## 2023-12-03 NOTE — Assessment & Plan Note (Signed)
 Will keep BP and cholesterol under good control. Continue to monitor. Call with any concerns.

## 2023-12-03 NOTE — Assessment & Plan Note (Signed)
 Going back up with A1c of 6.2. Will start him on ozempic . Call with any concerns. Continue to monitor.

## 2023-12-03 NOTE — Progress Notes (Signed)
 BP 128/75   Pulse (!) 45   Temp 98 F (36.7 C) (Oral)   Ht 5' 8 (1.727 m)   Wt 221 lb 6.4 oz (100.4 kg)   SpO2 97%   BMI 33.66 kg/m    Subjective:    Patient ID: Daniel Cook, male    DOB: 1955-04-19, 68 y.o.   MRN: [id number]  HPI: Daniel Cook is a 68 y.o. male presenting on 12/03/2023 for comprehensive medical examination. Current medical complaints include:  DIABETES Hypoglycemic episodes:no Polydipsia/polyuria: no Visual disturbance: no Chest pain: no Paresthesias: no Glucose Monitoring: no  Accucheck frequency: Not Checking Taking Insulin?: no Blood Pressure Monitoring: not checking Retinal Examination: Up to Date Foot Exam: Up to Date Diabetic Education: Completed Pneumovax: Up to Date Influenza: Not up to Date Aspirin: yes  No gout flares. Tolerating his medicine well. No concerns.   Has been having pain in his L knee and his back was hurting. He has had some swollen feet for a couple of days.  HYPERTENSION / HYPERLIPIDEMIA Satisfied with current treatment? yes Duration of hypertension: chronic BP monitoring frequency: not checking BP medication side effects: no Past BP meds: losartan , hydrochlorothiazide , amlodipine  Duration of hyperlipidemia: chronic Cholesterol medication side effects: no Cholesterol supplements: none Past cholesterol medications: vascepa , zetia , atorvastatin  Medication compliance: excellent compliance Aspirin: yes Recent stressors: no Recurrent headaches: no Visual changes: no Palpitations: no Dyspnea: no Chest pain: no Lower extremity edema: no Dizzy/lightheaded: no  Interim Problems from his last visit: no  Depression Screen done today and results listed below:     12/03/2023    1:14 PM 05/21/2023    8:20 AM 04/15/2023    8:14 AM 10/08/2022    8:32 AM 05/07/2022    2:58 PM  Depression screen PHQ 2/9  Decreased Interest 0 0 0 0 0  Down, Depressed, Hopeless 0 0 0 0 0  PHQ - 2 Score 0 0 0 0 0  Altered sleeping 0 0  0 0 0  Tired, decreased energy 1 0 1 2 0  Change in appetite 0 0 0 0 0  Feeling bad or failure about yourself  0 0 0 0 0  Trouble concentrating 0 0 0 0 0  Moving slowly or fidgety/restless 0 0 0 0 0  Suicidal thoughts 0 0 0 0 0  PHQ-9 Score 1 0 1 2 0  Difficult doing work/chores Somewhat difficult  Not difficult at all Somewhat difficult Not difficult at all    Past Medical History:  Past Medical History:  Diagnosis Date   Allergy    Nasal Allergy mainly spring and fall   Arthritis    COPD (chronic obstructive pulmonary disease) (HCC)    COVID-19 12/2019   Has seen pulmonology for 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   GERD (gastroesophageal reflux disease) March 2000   Gout    High cholesterol    Hypertension    Obesity    Sleep apnea    CPAP   Vertigo     Surgical History:  Past Surgical History:  Procedure Laterality Date   COLONOSCOPY WITH PROPOFOL  N/A 10/06/2020   Procedure: COLONOSCOPY WITH PROPOFOL ;  Surgeon: Jinny Carmine, MD;  Location: [facility];  Service: Endoscopy;  Laterality: N/A;   ESOPHAGOGASTRODUODENOSCOPY (EGD) WITH PROPOFOL  N/A 10/06/2020   Procedure: ESOPHAGOGASTRODUODENOSCOPY (EGD) WITH PROPOFOL ;  Surgeon: Jinny Carmine, MD;  Location: [facility];  Service: Endoscopy;  Laterality: N/A;   knee surgeries      Medications:  Current Outpatient  Medications on File Prior to Visit  Medication Sig   Ascorbic Acid (VITAMIN C) 1000 MG tablet Take 1,000 mg by mouth daily.   Aspirin 81 MG CAPS Take 81 mg by mouth daily.   Cholecalciferol (VITAMIN D3) 25 MCG (1000 UT) CAPS Take 1 capsule by mouth daily.   Glucosamine-Chondroit-Biofl-Mn (GLUCOSAMINE CHONDROIT,BIOFLAV, PO) Take 1 capsule by mouth daily.   HYDROcodone -acetaminophen  (NORCO) 10-325 MG tablet Take 1 tablet by mouth 2 (two) times daily.   icosapent  Ethyl (VASCEPA ) 1 g capsule Take 2 capsules (2 g total) by mouth 2 (two) times daily.   loratadine  (CLARITIN ) 10 MG tablet Take 1 tablet (10 mg total)  by mouth daily.   losartan  (COZAAR ) 100 MG tablet Take 100 mg by mouth daily.   magnesium gluconate (MAGONATE) 500 MG tablet Take 500 mg by mouth daily.   Omega-3 Fatty Acids (FISH OIL) 1200 MG CAPS Take 1 capsule by mouth daily.   simethicone  (MYLICON) 40 MG/0.6ML drops Take 0.6 mLs (40 mg total) by mouth 4 (four) times daily as needed for flatulence.   tiZANidine (ZANAFLEX) 4 MG tablet Take 4 mg by mouth as needed.   Vitamin E 134 MG (200 UNIT) TABS Take 1 tablet by mouth daily.   Zinc 50 MG CAPS Take 1 capsule by mouth daily.   No current facility-administered medications on file prior to visit.    Allergies:  Allergies  Allergen Reactions   Olmesartan  Other (See Comments)    Lower extremity edema    Spironolactone  Other (See Comments)    hyperkalemia    Social History:  Social History   Socioeconomic History   Marital status: Divorced    Spouse name: Not on file   Number of children: Not on file   Years of education: Not on file   Highest education level: Some college, no degree  Occupational History   Not on file  Tobacco Use   Smoking status: Former    Current packs/day: 0.00    Average packs/day: 2.0 packs/day for 20.0 years (40.0 ttl pk-yrs)    Types: Cigarettes    Start date: 10/04/1978    Quit date: 10/04/1998    Years since quitting: 25.1   Smokeless tobacco: Current    Types: Snuff   Tobacco comments:    dip occassionally  Vaping Use   Vaping status: Never Used  Substance and Sexual Activity   Alcohol use: Yes    Comment: occ (weekends)   Drug use: No    Types: Marijuana   Sexual activity: Not on file  Other Topics Concern   Not on file  Social History Narrative   Not on file   Social Drivers of Health   Financial Resource Strain: Low Risk  (12/03/2023)   Overall Financial Resource Strain (CARDIA)    Difficulty of Paying Living Expenses: Not very hard  Food Insecurity: No Food Insecurity (12/03/2023)   Hunger Vital Sign    Worried About Running  Out of Food in the Last Year: Never true    Ran Out of Food in the Last Year: Never true  Transportation Needs: No Transportation Needs (12/03/2023)   PRAPARE - Administrator, Civil Service (Medical): No    Lack of Transportation (Non-Medical): No  Physical Activity: Insufficiently Active (12/03/2023)   Exercise Vital Sign    Days of Exercise per Week: 1 day    Minutes of Exercise per Session: 10 min  Stress: No Stress Concern Present (12/03/2023)   [employer] of  Occupational Health - Occupational Stress Questionnaire    Feeling of Stress: Not at all  Social Connections: Moderately Isolated (12/03/2023)   Social Connection and Isolation Panel    Frequency of Communication with Friends and Family: More than three times a week    Frequency of Social Gatherings with Friends and Family: Three times a week    Attends Religious Services: More than 4 times per year    Active Member of Clubs or Organizations: No    Attends Banker Meetings: Not on file    Marital Status: Divorced  Intimate Partner Violence: Not At Risk (05/21/2023)   Humiliation, Afraid, Rape, and Kick questionnaire    Fear of Current or Ex-Partner: No    Emotionally Abused: No    Physically Abused: No    Sexually Abused: No   Social History   Tobacco Use  Smoking Status Former   Current packs/day: 0.00   Average packs/day: 2.0 packs/day for 20.0 years (40.0 ttl pk-yrs)   Types: Cigarettes   Start date: 10/04/1978   Quit date: 10/04/1998   Years since quitting: 25.1  Smokeless Tobacco Current   Types: Snuff  Tobacco Comments   dip occassionally   Social History   Substance and Sexual Activity  Alcohol Use Yes   Comment: occ (weekends)    Family History:  Family History  Problem Relation Age of Onset   Hyperlipidemia Father    Hypertension Father    Heart disease Father    Heart attack Father 20    Past medical history, surgical history, medications, allergies, family history  and social history reviewed with patient today and changes made to appropriate areas of the chart.   Review of Systems  Constitutional:  Positive for malaise/fatigue. Negative for chills, diaphoresis, fever and weight loss.  HENT: Negative.    Eyes: Negative.   Respiratory: Negative.    Cardiovascular:  Positive for leg swelling. Negative for chest pain, palpitations, orthopnea, claudication and PND.  Gastrointestinal: Negative.   Genitourinary: Negative.   Musculoskeletal:  Positive for back pain and joint pain. Negative for falls, myalgias and neck pain.  Skin: Negative.   Neurological:  Positive for dizziness. Negative for tingling, tremors, sensory change, speech change, focal weakness, seizures, loss of consciousness, weakness and headaches.  Endo/Heme/Allergies: Negative.   Psychiatric/Behavioral: Negative.     All other ROS negative except what is listed above and in the HPI.      Objective:    BP 128/75   Pulse (!) 45   Temp 98 F (36.7 C) (Oral)   Ht 5' 8 (1.727 m)   Wt 221 lb 6.4 oz (100.4 kg)   SpO2 97%   BMI 33.66 kg/m   Wt Readings from Last 3 Encounters:  12/03/23 221 lb 6.4 oz (100.4 kg)  09/02/23 215 lb (97.5 kg)  07/02/23 218 lb 4 oz (99 kg)    Physical Exam Vitals and nursing note reviewed.  Constitutional:      General: He is not in acute distress.    Appearance: Normal appearance. He is obese. He is not ill-appearing, toxic-appearing or diaphoretic.  HENT:     Head: Normocephalic and atraumatic.     Right Ear: Tympanic membrane, ear canal and external ear normal. There is no impacted cerumen.     Left Ear: Tympanic membrane, ear canal and external ear normal. There is no impacted cerumen.     Nose: Nose normal. No congestion or rhinorrhea.     Mouth/Throat:  Mouth: Mucous membranes are moist.     Pharynx: Oropharynx is clear. No oropharyngeal exudate or posterior oropharyngeal erythema.  Eyes:     General: No scleral icterus.       Right eye:  No discharge.        Left eye: No discharge.     Extraocular Movements: Extraocular movements intact.     Conjunctiva/sclera: Conjunctivae normal.     Pupils: Pupils are equal, round, and reactive to light.  Neck:     Vascular: No carotid bruit.  Cardiovascular:     Rate and Rhythm: Normal rate and regular rhythm.     Pulses: Normal pulses.     Heart sounds: No murmur heard.    No friction rub. No gallop.  Pulmonary:     Effort: Pulmonary effort is normal. No respiratory distress.     Breath sounds: Normal breath sounds. No stridor. No wheezing, rhonchi or rales.  Chest:     Chest wall: No tenderness.  Abdominal:     General: Abdomen is flat. Bowel sounds are normal. There is no distension.     Palpations: Abdomen is soft. There is no mass.     Tenderness: There is no abdominal tenderness. There is no right CVA tenderness, left CVA tenderness, guarding or rebound.     Hernia: No hernia is present.  Genitourinary:    Comments: Genital exam deferred with shared decision making Musculoskeletal:        General: No swelling, tenderness, deformity or signs of injury.     Cervical back: Normal range of motion and neck supple. No rigidity. No muscular tenderness.     Right lower leg: No edema.     Left lower leg: No edema.  Lymphadenopathy:     Cervical: No cervical adenopathy.  Skin:    General: Skin is warm and dry.     Capillary Refill: Capillary refill takes less than 2 seconds.     Coloration: Skin is not jaundiced or pale.     Findings: No bruising, erythema, lesion or rash.  Neurological:     General: No focal deficit present.     Mental Status: He is alert and oriented to person, place, and time.     Cranial Nerves: No cranial nerve deficit.     Sensory: No sensory deficit.     Motor: No weakness.     Coordination: Coordination normal.     Gait: Gait normal.     Deep Tendon Reflexes: Reflexes normal.  Psychiatric:        Mood and Affect: Mood normal.        Behavior:  Behavior normal.        Thought Content: Thought content normal.        Judgment: Judgment normal.     Results for orders placed or performed in visit on 09/16/23  Basic Metabolic Panel (BMET)   Collection Time: 09/16/23  9:16 AM  Result Value Ref Range   Glucose 102 (H) 70 - 99 mg/dL   BUN 18 8 - 27 mg/dL   Creatinine, Ser 8.87 0.76 - 1.27 mg/dL   eGFR 72 >40 fO/fpw/8.26   BUN/Creatinine Ratio 16 10 - 24   Sodium 138 134 - 144 mmol/L   Potassium 4.6 3.5 - 5.2 mmol/L   Chloride 101 96 - 106 mmol/L   CO2 21 20 - 29 mmol/L   Calcium  9.7 8.6 - 10.2 mg/dL      Assessment & Plan:   Problem List Items Addressed  This Visit       Cardiovascular and Mediastinum   Hypertension   Under good control on current regimen. Continue current regimen. Continue to monitor. Call with any concerns. Refills given. Labs drawn today.       Relevant Medications   amLODipine  (NORVASC ) 10 MG tablet   atorvastatin  (LIPITOR) 80 MG tablet   ezetimibe  (ZETIA ) 10 MG tablet   hydrochlorothiazide  (HYDRODIURIL ) 25 MG tablet   Other Relevant Orders   Comprehensive metabolic panel with GFR   CBC with Differential/Platelet   TSH   Microalbumin, Urine Waived   AMB Referral [facility] Care Management   Aortic atherosclerosis (HCC)   Will keep BP and cholesterol under good control. Continue to monitor. Call with any concerns.       Relevant Medications   amLODipine  (NORVASC ) 10 MG tablet   atorvastatin  (LIPITOR) 80 MG tablet   ezetimibe  (ZETIA ) 10 MG tablet   hydrochlorothiazide  (HYDRODIURIL ) 25 MG tablet   Other Relevant Orders   Comprehensive metabolic panel with GFR   CBC with Differential/Platelet   TSH   AMB Referral [facility]BCI Care Management     Respiratory   COPD (chronic obstructive pulmonary disease) (HCC)   Unable to get breztri  due to cost- will get pharmacy involved. Call with any concerns.       Relevant Medications   albuterol  (VENTOLIN  HFA) 108 (90 Base) MCG/ACT inhaler    budesonide -glycopyrrolate -formoterol  (BREZTRI  AEROSPHERE) 160-9-4.8 MCG/ACT AERO inhaler   Other Relevant Orders   Comprehensive metabolic panel with GFR   CBC with Differential/Platelet   TSH   AMB Referral [facility] Care Management     Endocrine   Controlled type 2 diabetes mellitus with complication, without long-term current use of insulin (HCC)   Going back up with A1c of 6.2. Will start him on ozempic . Call with any concerns. Continue to monitor.       Relevant Medications   atorvastatin  (LIPITOR) 80 MG tablet   Semaglutide ,0.25 or 0.5MG /DOS, (OZEMPIC , 0.25 OR 0.5 MG/DOSE,) 2 MG/3ML SOPN   Other Relevant Orders   Comprehensive metabolic panel with GFR   CBC with Differential/Platelet   TSH   Microalbumin, Urine Waived   Bayer DCA Hb A1c Waived   AMB Referral [facility] Care Management     Musculoskeletal and Integument   DDD (degenerative disc disease), lumbar   Under good control on current regimen. Continue current regimen. Continue to monitor. Call with any concerns. Refills given. Labs drawn today.       Relevant Medications   gabapentin  (NEURONTIN ) 300 MG capsule   naproxen  (NAPROSYN ) 500 MG tablet   Other Relevant Orders   AMB Referral [facility] Care Management     Other   High cholesterol   Under good control on current regimen. Continue current regimen. Continue to monitor. Call with any concerns. Refills given. Labs drawn today.        Relevant Medications   amLODipine  (NORVASC ) 10 MG tablet   atorvastatin  (LIPITOR) 80 MG tablet   ezetimibe  (ZETIA ) 10 MG tablet   hydrochlorothiazide  (HYDRODIURIL ) 25 MG tablet   Other Relevant Orders   Comprehensive metabolic panel with GFR   CBC with Differential/Platelet   Lipid Panel w/o Chol/HDL Ratio   TSH   AMB Referral [facility]I Care Management   Idiopathic gout   Under good control on current regimen. Continue current regimen. Continue to monitor. Call with any concerns. Refills given. Labs drawn today.       Relevant  Medications   allopurinol  (ZYLOPRIM ) 300 MG  tablet   naproxen  (NAPROSYN ) 500 MG tablet   Other Relevant Orders   Comprehensive metabolic panel with GFR   CBC with Differential/Platelet   TSH   Uric acid   AMB Referral [facility]BCI Care Management   Other Visit Diagnoses       Routine general medical examination at a health care facility    -  Primary   Vaccines up to date/declined. Screening labs checked today. Colonoscopy up to date. Continue diet and exercise. Call with any concerns.   Relevant Orders   Comprehensive metabolic panel with GFR   CBC with Differential/Platelet   PSA   TSH     Peripheral edema       Will check BNP. Await results.   Relevant Orders   B Nat Peptide   AMB Referral [facility]BCI Care Management       LABORATORY TESTING:  Health maintenance labs ordered today as discussed above.   The natural history of prostate cancer and ongoing controversy regarding screening and potential treatment outcomes of prostate cancer has been discussed with the patient. The meaning of a false positive PSA and a false negative PSA has been discussed. He indicates understanding of the limitations of this screening test and wishes to proceed with screening PSA testing.   IMMUNIZATIONS:   - Tdap: Tetanus vaccination status reviewed: last tetanus booster within 10 years. - Influenza: Refused - Pneumovax: Up to date - Prevnar: Up to date - COVID: Refused - HPV: Not applicable - Shingrix vaccine: Refused  SCREENING: - Colonoscopy: Up to date  Discussed with patient purpose of the colonoscopy is to detect colon cancer at curable precancerous or early stages   PATIENT COUNSELING:    Sexuality: Discussed sexually transmitted diseases, partner selection, use of condoms, avoidance of unintended pregnancy  and contraceptive alternatives.   Advised to avoid cigarette smoking.  I discussed with the patient that most people either abstain from alcohol or drink within safe limits (<=14/week  and <=4 drinks/occasion for males, <=7/weeks and <= 3 drinks/occasion for females) and that the risk for alcohol disorders and other health effects rises proportionally with the number of drinks per week and how often a drinker exceeds daily limits.  Discussed cessation/primary prevention of drug use and availability of treatment for abuse.   Diet: Encouraged to adjust caloric intake to maintain  or achieve ideal body weight, to reduce intake of dietary saturated fat and total fat, to limit sodium intake by avoiding high sodium foods and not adding table salt, and to maintain adequate dietary potassium and calcium  preferably from fresh fruits, vegetables, and low-fat dairy products.    stressed the importance of regular exercise  Injury prevention: Discussed safety belts, safety helmets, smoke detector, smoking near bedding or upholstery.   Dental health: Discussed importance of regular tooth brushing, flossing, and dental visits.   Follow up plan: NEXT PREVENTATIVE PHYSICAL DUE IN 1 YEAR. Return in about 3 months (around 03/04/2024).

## 2023-12-03 NOTE — Assessment & Plan Note (Signed)
 Unable to get breztri  due to cost- will get pharmacy involved. Call with any concerns.

## 2023-12-04 ENCOUNTER — Ambulatory Visit: Payer: Self-pay | Admitting: Family Medicine

## 2023-12-04 ENCOUNTER — Telehealth: Payer: Self-pay

## 2023-12-04 LAB — CBC WITH DIFFERENTIAL/PLATELET
Basophils Absolute: 0.1 x10E3/uL (ref 0.0–0.2)
Basos: 1 %
EOS (ABSOLUTE): 0.2 x10E3/uL (ref 0.0–0.4)
Eos: 2 %
Hematocrit: 38.5 % (ref 37.5–51.0)
Hemoglobin: 12.7 g/dL — ABNORMAL LOW (ref 13.0–17.7)
Immature Grans (Abs): 0 x10E3/uL (ref 0.0–0.1)
Immature Granulocytes: 0 %
Lymphocytes Absolute: 1.8 x10E3/uL (ref 0.7–3.1)
Lymphs: 19 %
MCH: 31.5 pg (ref 26.6–33.0)
MCHC: 33 g/dL (ref 31.5–35.7)
MCV: 96 fL (ref 79–97)
Monocytes Absolute: 0.7 x10E3/uL (ref 0.1–0.9)
Monocytes: 7 %
Neutrophils Absolute: 6.6 x10E3/uL (ref 1.4–7.0)
Neutrophils: 71 %
Platelets: 244 x10E3/uL (ref 150–450)
RBC: 4.03 x10E6/uL — ABNORMAL LOW (ref 4.14–5.80)
RDW: 13.8 % (ref 11.6–15.4)
WBC: 9.3 x10E3/uL (ref 3.4–10.8)

## 2023-12-04 LAB — LIPID PANEL W/O CHOL/HDL RATIO
Cholesterol, Total: 165 mg/dL (ref 100–199)
HDL: 52 mg/dL (ref >39)
LDL Chol Calc (NIH): 100 mg/dL — ABNORMAL HIGH (ref 0–99)
Triglycerides: 67 mg/dL (ref 0–149)
VLDL Cholesterol Cal: 13 mg/dL (ref 5–40)

## 2023-12-04 LAB — COMPREHENSIVE METABOLIC PANEL WITH GFR
ALT: 21 IU/L (ref 0–44)
AST: 24 IU/L (ref 0–40)
Albumin: 4.6 g/dL (ref 3.9–4.9)
Alkaline Phosphatase: 68 IU/L (ref 44–121)
BUN/Creatinine Ratio: 20 (ref 10–24)
BUN: 25 mg/dL (ref 8–27)
Bilirubin Total: 0.5 mg/dL (ref 0.0–1.2)
CO2: 24 mmol/L (ref 20–29)
Calcium: 9.5 mg/dL (ref 8.6–10.2)
Chloride: 99 mmol/L (ref 96–106)
Creatinine, Ser: 1.28 mg/dL — ABNORMAL HIGH (ref 0.76–1.27)
Globulin, Total: 2.3 g/dL (ref 1.5–4.5)
Glucose: 91 mg/dL (ref 70–99)
Potassium: 4.2 mmol/L (ref 3.5–5.2)
Sodium: 139 mmol/L (ref 134–144)
Total Protein: 6.9 g/dL (ref 6.0–8.5)
eGFR: 62 mL/min/1.73 (ref >59)

## 2023-12-04 LAB — BRAIN NATRIURETIC PEPTIDE: BNP: 44.9 pg/mL (ref 0.0–100.0)

## 2023-12-04 LAB — PSA: Prostate Specific Ag, Serum: 0.6 ng/mL (ref 0.0–4.0)

## 2023-12-04 LAB — TSH: TSH: 1 u[IU]/mL (ref 0.450–4.500)

## 2023-12-04 LAB — URIC ACID: Uric Acid: 4.7 mg/dL (ref 3.8–8.4)

## 2023-12-04 NOTE — Progress Notes (Signed)
 Care Guide Pharmacy Note  12/04/2023 Name: TRAQUAN DUARTE MRN: [id number] DOB: 07-29-1955  Referred By: Vicci Duwaine SQUIBB, DO Reason for referral: Transitions Of Care (Outreach to schedule with Pharm d )   RAPHAEL FITZPATRICK is a 68 y.o. year old male who is a primary care patient of Vicci Duwaine SQUIBB, DO.  SAVEON PLANT was referred to the pharmacist for assistance related to: COPD  Successful contact was made with the patient to discuss pharmacy services including being ready for the pharmacist to call at least 5 minutes before the scheduled appointment time and to have medication bottles and any blood pressure readings ready for review. The patient agreed to meet with the pharmacist via telephone visit on (date/time).12/17/2023  Jeoffrey Buffalo , RMA     [place]  [facility] Guide  Direct Dial: [phone]  Website: [email or web address]

## 2023-12-08 NOTE — Progress Notes (Unsigned)
 Cardiology Office Note  Date:  12/09/2023   ID:  Daniel Cook, Daniel Cook [birth year], MRN [id number]  PCP:  Daniel Duwaine SQUIBB, DO   Chief Complaint  Patient presents with   3 month follow up     Doing well.     HPI:  [name] is a 68 year old gentleman with past medical history of Hypertension Sleep apnea on CPAP Hyperlipidemia Former smoker Chronic bradycardia rate in the 40 bpm CT coronary calcium  scoring 185 in February 2022 Who presents for routine follow-up of his coronary calcification, bradycardia  Last seen by myself 5/24 Last seen by one of our providers May 2025  Overall reports feeling well Low BP today, asymptomatic Continues to have low heart rate, asymptomatic  Tremor/pressure in his head on right, better with NSAIDS (happened 4-6 times over the past several months)  Labs reviewed Last week CR 1.28, above his baseline A1c 6.1 total cholesterol 160s  Wants knee surgery in next 6 months Denies shortness of breath or chest pain on exertion  Prior history of nerve pain in left arm No regular exercise program  Taking Lipitor 80 with Zetia  10 mg daily  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[place], concrete floor, sits on stool  Chronic low heart rate, asymptomatic Rates typically in the 40s dating back several years Previous seen by EP  EKG personally reviewed by myself on todays visit EKG Interpretation Date/Time:  Tuesday December 09 2023 08:33:15 EDT Ventricular Rate:  43 PR Interval:  194 QRS Duration:  86 QT Interval:  408 QTC Calculation: 344 R Axis:   36  Text Interpretation: Marked sinus bradycardia When compared with ECG of 09-Dec-2023 08:32, No significant change was found Confirmed by Perla Lye [phone]) on 12/09/2023 8:37:33 AM   Other past medical history reviewed Stress test was previously ordered for atypical chest pain Did not go through with a stress test, was too expensive Could not afford cardiac CTA  Had  COVID February 2022, severe symptoms,   ZIO previously performed showing brief episodes of nonsustained atrial tachycardia  Echo March 2022 Normal LV function, normal RV function, no significant valve disease   PMH:   has a past medical history of Allergy, Arthritis, COPD (chronic obstructive pulmonary disease) (HCC), COVID-19 (12/2019), GERD (gastroesophageal reflux disease) (March 2000), Gout, High cholesterol, Hypertension, Obesity, Sleep apnea, and Vertigo.  PSH:    Past Surgical History:  Procedure Laterality Date   COLONOSCOPY WITH PROPOFOL  N/A 10/06/2020   Procedure: COLONOSCOPY WITH PROPOFOL ;  Surgeon: Jinny Carmine, MD;  Location: [facility];  Service: Endoscopy;  Laterality: N/A;   ESOPHAGOGASTRODUODENOSCOPY (EGD) WITH PROPOFOL  N/A 10/06/2020   Procedure: ESOPHAGOGASTRODUODENOSCOPY (EGD) WITH PROPOFOL ;  Surgeon: Jinny Carmine, MD;  Location: [facility];  Service: Endoscopy;  Laterality: N/A;   knee surgeries      Current Outpatient Medications  Medication Sig Dispense Refill   albuterol  (VENTOLIN  HFA) 108 (90 Base) MCG/ACT inhaler INHALE 1-2 PUFFS INTO THE LUNGS EVERY 6 HOURS AS NEEDED FOR WHEEZING/ SHORTNESS OF BREATH 8.5 g 6   allopurinol  (ZYLOPRIM ) 300 MG tablet Take 1 tablet (300 mg total) by mouth daily. 90 tablet 1   amLODipine  (NORVASC ) 10 MG tablet Take 1 tablet (10 mg total) by mouth daily. 90 tablet 1   Ascorbic Acid (VITAMIN C) 1000 MG tablet Take 1,000 mg by mouth daily.     Aspirin 81 MG CAPS Take 81 mg by mouth daily.     atorvastatin  (LIPITOR) 80 MG tablet Take  1 tablet (80 mg total) by mouth daily. 90 tablet 1   budesonide -glycopyrrolate -formoterol  (BREZTRI  AEROSPHERE) 160-9-4.8 MCG/ACT AERO inhaler Inhale 2 puffs into the lungs 2 (two) times daily. 3 each 1   Cholecalciferol (VITAMIN D3) 25 MCG (1000 UT) CAPS Take 1 capsule by mouth daily.     colchicine  0.6 MG tablet Take 1 tablet (0.6 mg total) by mouth daily as needed. 90 tablet 1   ezetimibe   (ZETIA ) 10 MG tablet Take 1 tablet (10 mg total) by mouth daily. 90 tablet 1   gabapentin  (NEURONTIN ) 300 MG capsule Take 2 capsules (600 mg total) by mouth 3 (three) times daily. 540 capsule 1   Glucosamine-Chondroit-Biofl-Mn (GLUCOSAMINE CHONDROIT,BIOFLAV, PO) Take 1 capsule by mouth daily.     hydrochlorothiazide  (HYDRODIURIL ) 25 MG tablet Take 1 tablet (25 mg total) by mouth daily. 90 tablet 1   HYDROcodone -acetaminophen  (NORCO) 10-325 MG tablet Take 1 tablet by mouth 2 (two) times daily.     icosapent  Ethyl (VASCEPA ) 1 g capsule Take 2 capsules (2 g total) by mouth 2 (two) times daily. 360 capsule 3   loratadine  (CLARITIN ) 10 MG tablet Take 1 tablet (10 mg total) by mouth daily. 90 tablet 1   losartan  (COZAAR ) 100 MG tablet Take 100 mg by mouth daily.     magnesium gluconate (MAGONATE) 500 MG tablet Take 500 mg by mouth daily.     naproxen  (NAPROSYN ) 500 MG tablet TAKE ONE TABLET TWICE DAILY WITH MEALS 180 tablet 1   Omega-3 Fatty Acids (FISH OIL) 1200 MG CAPS Take 1 capsule by mouth daily.     omeprazole  (PRILOSEC) 20 MG capsule Take 1 capsule (20 mg total) by mouth daily. 90 capsule 1   Semaglutide ,0.25 or 0.5MG /DOS, (OZEMPIC , 0.25 OR 0.5 MG/DOSE,) 2 MG/3ML SOPN Inject 0.25 mg into the skin once a week.     simethicone  (MYLICON) 40 MG/0.6ML drops Take 0.6 mLs (40 mg total) by mouth 4 (four) times daily as needed for flatulence. 120 mL 1   tiZANidine (ZANAFLEX) 4 MG tablet Take 4 mg by mouth as needed.     Vitamin E 134 MG (200 UNIT) TABS Take 1 tablet by mouth daily.     Zinc 50 MG CAPS Take 1 capsule by mouth daily.     No current facility-administered medications for this visit.    Allergies:   Olmesartan  and Spironolactone    Social History:  The patient  reports that he quit smoking about 25 years ago. His smoking use included cigarettes. He started smoking about 45 years ago. He has a 40 pack-year smoking history. His smokeless tobacco use includes snuff. He reports current alcohol  use. He reports that he does not use drugs.   Family History:   family history includes Heart attack (age of onset: 29) in his father; Heart disease in his father; Hyperlipidemia in his father; Hypertension in his father.   Review of Systems: Review of Systems  Constitutional: Negative.   HENT: Negative.    Respiratory: Negative.    Cardiovascular: Negative.   Gastrointestinal: Negative.   Musculoskeletal: Negative.   Neurological: Negative.   Psychiatric/Behavioral: Negative.    All other systems reviewed and are negative.  PHYSICAL EXAM: VS:  BP 110/70 (BP Location: Left Arm, Patient Position: Sitting, Cuff Size: Normal)   Pulse (!) 43   Ht 5' 8 (1.727 m)   Wt 216 lb 6 oz (98.1 kg)   SpO2 97%   BMI 32.90 kg/m  , BMI Body mass index is 32.9 kg/m.  Constitutional:  oriented to person, place, and time. No distress.  HENT:  Head: Grossly normal Eyes:  no discharge. No scleral icterus.  Neck: No JVD, no carotid bruits  Cardiovascular: Regular rate and rhythm, no murmurs appreciated Pulmonary/Chest: Clear to auscultation bilaterally, no wheezes or rales Abdominal: Soft.  no distension.  no tenderness.  Musculoskeletal: Normal range of motion Neurological:  normal muscle tone. Coordination normal. No atrophy Skin: Skin warm and dry Psychiatric: normal affect, pleasant  Recent Labs: 12/03/2023: ALT 21; BNP 44.9; BUN 25; Creatinine, Ser 1.28; Hemoglobin 12.7; Platelets 244; Potassium 4.2; Sodium 139; TSH 1.000    Lipid Panel Lab Results  Component Value Date   CHOL 165 12/03/2023   HDL 52 12/03/2023   LDLCALC 100 (H) 12/03/2023   TRIG 67 12/03/2023     Wt Readings from Last 3 Encounters:  12/09/23 216 lb 6 oz (98.1 kg)  12/03/23 221 lb 6.4 oz (100.4 kg)  09/02/23 215 lb (97.5 kg)     ASSESSMENT AND PLAN:  Problem List Items Addressed This Visit       Cardiology Problems   Aortic atherosclerosis (HCC)   Relevant Orders   EKG 12-Lead (Completed)     Other    Controlled type 2 diabetes mellitus with complication, without long-term current use of insulin (HCC)   COPD (chronic obstructive pulmonary disease) (HCC) - Primary   Smokeless tobacco use   Other Visit Diagnoses       PSVT (paroxysmal supraventricular tachycardia) (HCC)       Relevant Orders   EKG 12-Lead (Completed)     Atrial tachycardia (HCC)       Relevant Orders   EKG 12-Lead (Completed)     Mixed hyperlipidemia         Hypercholesterolemia          Preop cardiovascular Acceptable risk for knee surgery No further cardiac testing needed  Coronary calcification Low calcium  score 185 in 2022 Continue Lipitor 80 with Zetia , goal total cholesterol less than 150 Started on ozempic   Bradycardia asymptomatic, seen by EP in the past,  No near syncope or syncope Management of low blood pressure as below No indication for pacemaker Stable  Essential hypertension Low blood pressure today, CR 1.28 Cut the hydrochlorothiazide  (12.5) and amlodipine  (5)  in 1/2 daily Monitor blood pressure at home and call us  with numbers  Hyperlipidemia Lipitor 80 with Zetia  10 daily  Obstructive sleep apnea on CPAP Compliant with a CPAP   Signed, Velinda Lunger, M.D., Ph.D. [facility] [place], [place] [phone]

## 2023-12-09 ENCOUNTER — Encounter: Payer: Self-pay | Admitting: Cardiovascular Disease

## 2023-12-09 ENCOUNTER — Ambulatory Visit: Attending: Cardiovascular Disease | Admitting: Cardiovascular Disease

## 2023-12-09 VITALS — BP 110/70 | HR 43 | Ht 68.0 in | Wt 216.4 lb

## 2023-12-09 DIAGNOSIS — I4719 Other supraventricular tachycardia: Secondary | ICD-10-CM | POA: Diagnosis not present

## 2023-12-09 DIAGNOSIS — E118 Type 2 diabetes mellitus with unspecified complications: Secondary | ICD-10-CM

## 2023-12-09 DIAGNOSIS — I700 Atherosclerosis of aorta: Secondary | ICD-10-CM | POA: Diagnosis not present

## 2023-12-09 DIAGNOSIS — I471 Supraventricular tachycardia, unspecified: Secondary | ICD-10-CM | POA: Diagnosis not present

## 2023-12-09 DIAGNOSIS — E7800 Pure hypercholesterolemia, unspecified: Secondary | ICD-10-CM

## 2023-12-09 DIAGNOSIS — J449 Chronic obstructive pulmonary disease, unspecified: Secondary | ICD-10-CM

## 2023-12-09 DIAGNOSIS — E782 Mixed hyperlipidemia: Secondary | ICD-10-CM | POA: Diagnosis not present

## 2023-12-09 DIAGNOSIS — Z720 Tobacco use: Secondary | ICD-10-CM

## 2023-12-09 MED ORDER — AMLODIPINE BESYLATE 5 MG PO TABS: 5.0000 mg | ORAL_TABLET | Freq: Every day | ORAL | 3 refills | Status: AC

## 2023-12-09 MED ORDER — HYDROCHLOROTHIAZIDE 12.5 MG PO TABS: 12.5000 mg | ORAL_TABLET | Freq: Every day | ORAL | 3 refills | Status: AC

## 2023-12-09 NOTE — Patient Instructions (Addendum)
 Medication Instructions:   Please decrease the hydrochlorothiazide  in 1/2 (12.5 mg daily)  Please decrease the amlodipine  down to 5 mg daily  Monitor blood pressure  If you need a refill on your cardiac medications before your next appointment, please call your pharmacy.   Lab work: No new labs needed  Testing/Procedures: No new testing needed  Follow-Up: At [facility], you and your health needs are our priority.  As part of our continuing mission to provide you with exceptional heart care, we have created designated Provider Care Teams.  These Care Teams include your primary Cardiologist (physician) and Advanced Practice Providers (APPs -  Physician Assistants and Nurse Practitioners) who all work together to provide you with the care you need, when you need it.  You will need a follow up appointment in 12 months  Providers on your designated Care Team:   Lonni Meager, NP Bernardino Bring, PA-C Cadence [place], [place]  COVID-19 Vaccine Information can be found at: [email or web address] For questions related to vaccine distribution or appointments, please email [email or web address] or call [phone].

## 2023-12-11 ENCOUNTER — Encounter: Payer: Self-pay | Admitting: Family Medicine

## 2023-12-16 ENCOUNTER — Encounter: Admitting: Family Medicine

## 2023-12-17 ENCOUNTER — Other Ambulatory Visit: Payer: Self-pay

## 2023-12-17 DIAGNOSIS — J449 Chronic obstructive pulmonary disease, unspecified: Secondary | ICD-10-CM

## 2023-12-17 DIAGNOSIS — E118 Type 2 diabetes mellitus with unspecified complications: Secondary | ICD-10-CM

## 2023-12-17 MED ORDER — OZEMPIC (0.25 OR 0.5 MG/DOSE) 2 MG/3ML ~~LOC~~ SOPN: 0.5000 mg | PEN_INJECTOR | SUBCUTANEOUS | 1 refills | Status: DC

## 2023-12-17 NOTE — Progress Notes (Signed)
   12/17/2023  Patient ID: Daniel Cook, male   DOB: 1956-03-02, 68 y.o.   MRN: [id number]  [facility], and Ozempic  requires a prior authorization on the patient's insurance.  Prior authorization request has been submitted to patient's insurance via CoverMyMeds (KEY [id number]).  I will monitor progress and keep patient/provider informed.  Contacted patient to make him aware of this, and that Breztri  will be ready for pick up at $0 copay later today at his pharmacy.  Channing DELENA Mealing, PharmD, DPLA

## 2023-12-17 NOTE — Progress Notes (Signed)
 12/17/2023 Name: Daniel Cook MRN: [id number] DOB: 1955-05-22  Chief Complaint  Patient presents with   Medication Assistance   Daniel Cook is a 68 y.o. year old male who presented for a telephone visit.   They were referred to the pharmacist by their PCP for assistance in managing medication access.   Subjective:  Care Team: Primary Care Provider: Vicci Duwaine SQUIBB, DO ; Next Scheduled Visit: 03/10/2024  Medication Access/Adherence  Current Pharmacy:  [facility] - [place], [place] - [address] [place] [address] Phone: [phone] Fax: [phone]  -Patient reports affordability concerns with their medications: Yes  -Patient reports access/transportation concerns to their pharmacy: No  -Patient reports adherence concerns with their medications:  Yes    Diabetes: Current medications: Ozempic  0.25mg  weekly -Patient denies hypoglycemic s/sx including dizziness, shakiness, sweating.  -Patient denies hyperglycemic symptoms including polyuria, polydipsia, polyphagia, nocturia, neuropathy, blurred vision. -Patient recently started on Ozempic  due to recent A1c increasing from last reading of 5.6% to 6.1% on 8/27.  Patient has had 3 dosees of Ozempic  0.25mg  weekly so far.  He was provided with a pen sample at his last PCP visit.  Prescription has not yet been sent to the pharmacy, so he is unsure what his copay will be.    Macrovascular and Microvascular Risk Reduction:  Statin? yes (Atorvastatin  80mg  daily); ACEi/ARB? yes (Losartan  100mg  daily) Last urinary albumin/creatinine ratio:  Lab Results  Component Value Date   MICRALBCREAT <30 12/03/2023   MICRALBCREAT 30-300 (H) 10/08/2022   MICRALBCREAT 30-300 (H) 04/09/2022   MICRALBCREAT <30 12/13/2020   MICRALBCREAT <30 05/02/2020   MICRALBCREAT <30 04/27/2019   MICRALBCREAT 30-300 (H) 10/26/2014   Last eye exam:  Lab Results  Component Value Date   HMDIABEYEEXA  08/26/2023      Comment:     UNABLE TO DETERMINE - ABST BY HIM   Last foot exam: 04/15/2023 Tobacco Use:  Tobacco Use: High Risk (12/09/2023)   Patient History    Smoking Tobacco Use: Former    Smokeless Tobacco Use: Current    Passive Exposure: Not on file   COPD: Current medications: Albuterol  rescue inhaler to be used as needed -Patient has been prescribed Breztri , but he does not have this medication due to cost- prescription was going to be $100 to fill -Patient does not believe a copay card for Breztri  was applied to pharmacy claim secondary to insurance -Patient states he gets Vascepa  through [facility] at no charge and is curious if he may be able to get any of his other medications through this service -Reports no exacerbations in the past year -Patient does not report trying any other inhalers for COPD in the past, but it appears he may have been on Symbicort  previously based on chart documentation  Objective:  Lab Results  Component Value Date   HGBA1C 6.1 (H) 12/03/2023   Lab Results  Component Value Date   CREATININE 1.28 (H) 12/03/2023   BUN 25 12/03/2023   NA 139 12/03/2023   K 4.2 12/03/2023   CL 99 12/03/2023   CO2 24 12/03/2023   Lab Results  Component Value Date   CHOL 165 12/03/2023   HDL 52 12/03/2023   LDLCALC 100 (H) 12/03/2023   TRIG 67 12/03/2023   CHOLHDL 3.6 08/22/2020   Medications Reviewed Today     Reviewed by Deanna Channing LABOR, RPH (Pharmacist) on 12/17/23 at [phone]  Med List Status: <None>   Medication Order Taking?  Sig Documenting Provider Last Dose Status Informant  albuterol  (VENTOLIN  HFA) 108 (90 Base) MCG/ACT inhaler [id number]  INHALE 1-2 PUFFS INTO THE LUNGS EVERY 6 HOURS AS NEEDED FOR WHEEZING/ SHORTNESS OF BREATH Johnson, Megan P, DO  Active   allopurinol  (ZYLOPRIM ) 300 MG tablet [id number]  Take 1 tablet (300 mg total) by mouth daily. Johnson, Megan P, DO  Active   amLODipine  (NORVASC ) 5 MG tablet [id number]  Take 1 tablet (5 mg total) by mouth daily.  Gollan, Timothy J, MD  Active   Ascorbic Acid (VITAMIN C) 1000 MG tablet [id number]  Take 1,000 mg by mouth daily. [provider]  Active   Aspirin 81 MG CAPS [id number]  Take 81 mg by mouth daily. [provider]  Active   atorvastatin  (LIPITOR) 80 MG tablet [id number]  Take 1 tablet (80 mg total) by mouth daily. Johnson, Megan P, DO  Active   budesonide -glycopyrrolate -formoterol  (BREZTRI  AEROSPHERE) 160-9-4.8 MCG/ACT AERO inhaler [id number]  Inhale 2 puffs into the lungs 2 (two) times daily.  Patient not taking: Reported on 12/17/2023   Vicci Duwaine SQUIBB, DO  Active   Cholecalciferol (VITAMIN D3) 25 MCG (1000 UT) CAPS [id number]  Take 1 capsule by mouth daily. [provider]  Active   colchicine  0.6 MG tablet [id number]  Take 1 tablet (0.6 mg total) by mouth daily as needed. Johnson, Megan P, DO  Active   ezetimibe  (ZETIA ) 10 MG tablet [id number]  Take 1 tablet (10 mg total) by mouth daily. Johnson, Megan P, DO  Active   gabapentin  (NEURONTIN ) 300 MG capsule [id number]  Take 2 capsules (600 mg total) by mouth 3 (three) times daily. Johnson, Megan P, DO  Active   Glucosamine-Chondroit-Biofl-Mn (GLUCOSAMINE CHONDROIT,BIOFLAV, PO) [id number]  Take 1 capsule by mouth daily. [provider]  Active   hydrochlorothiazide  (HYDRODIURIL ) 12.5 MG tablet [id number]  Take 1 tablet (12.5 mg total) by mouth daily. Gollan, Timothy J, MD  Active   HYDROcodone -acetaminophen  [facility]) 10-325 MG tablet [id number]  Take 1 tablet by mouth 2 (two) times daily. [provider]  Active   icosapent  Ethyl (VASCEPA ) 1 g capsule [id number]  Take 2 capsules (2 g total) by mouth 2 (two) times daily. Gollan, Timothy J, MD  Active   loratadine  (CLARITIN ) 10 MG tablet [id number]  Take 1 tablet (10 mg total) by mouth daily. Johnson, Megan P, DO  Active   losartan  (COZAAR ) 100 MG tablet [id number]  Take 100 mg by mouth daily. [provider]  Active   magnesium gluconate (MAGONATE) 500 MG  tablet [id number]  Take 500 mg by mouth daily. [provider]  Active   naproxen  (NAPROSYN ) 500 MG tablet [id number]  TAKE ONE TABLET TWICE DAILY WITH MEALS Johnson, Megan P, DO  Active   Omega-3 Fatty Acids (FISH OIL) 1200 MG CAPS [id number]  Take 1 capsule by mouth daily. [provider]  Active   omeprazole  (PRILOSEC) 20 MG capsule [id number]  Take 1 capsule (20 mg total) by mouth daily. Johnson, Megan P, DO  Active   Semaglutide ,0.25 or 0.5MG /DOS, (OZEMPIC , 0.25 OR 0.5 MG/DOSE,) 2 MG/3ML SOPN [id number] Yes Inject 0.25 mg into the skin once a week. Johnson, Megan P, DO  Active   simethicone  (MYLICON) 40 MG/0.6ML drops [id number]  Take 0.6 mLs (40 mg total) by mouth 4 (four) times daily as needed for flatulence. Johnson, Megan P, DO  Active   tiZANidine (ZANAFLEX) 4 MG tablet [id number]  Take 4 mg by mouth as needed. [provider]  Active  Vitamin E 134 MG (200 UNIT) TABS [id number]  Take 1 tablet by mouth daily. [provider]  Active   Zinc 50 MG CAPS [id number]  Take 1 capsule by mouth daily. [provider]  Active            Assessment/Plan:   Diabetes: -Currently controlled; goal A1c <7%. Cardiorenal risk reduction is optimized.. Blood pressure is at goal <130/80. LDL is not at goal of <100 -Continue Ozempic  0.25mg  weekly for at least a total of 4 weeks before increasing to 0.5mg  -Obtained manufacturer copay card that should make patient's copay $25 if covered by insurance (and depending on deductible with plan) -Order pending for Ozempic  0.5mg  weekly along with copay card information - once signed, I will contact the pharmacy to check patient's copay  COPD: -Currently controlled.  -Recommend to resume Breztri  2 puffs BID  -Obtained manufacturer copay card and provided information to [facility]- medication is now going through for $0.  Pharmacy does have to order, but medication will be ready for pick up tomorrow. -[facility] does carry all  of patient's medications except for hydrocodone , but they state they would not have cheaper pricing for Breztri  or Ozempic  than what patient would pay if covered by insurance (only if not covered  by insurance)  Channing DELENA Mealing, PharmD, DPLA

## 2023-12-18 ENCOUNTER — Telehealth: Payer: Self-pay

## 2023-12-18 NOTE — Progress Notes (Signed)
   12/18/2023  Patient ID: Reyes CHRISTELLA Sample, male   DOB: June 26, 1955, 67 y.o.   MRN: [id number]  Prior authorization for Ozempic  has been approved, and copay on insurance would be $100; but copay card will take this down to $25.  [facility] is not able to fill this medication, so I am sending patient a message to see if he would like this medication sent to [facility] along with refill for loratadine  10mg  daily he has requested be sent to that pharmacy.  I am also going to schedule a telephone follow-up in 4 weeks to see how things are going with Ozempic .  Channing DELENA Mealing, PharmD, DPLA

## 2023-12-19 NOTE — Progress Notes (Signed)
   12/19/2023  Patient ID: Daniel Cook Sample, male   DOB: [birth year], 68 y.o.   MRN: [id number]  Outreach attempt to follow-up with patient in regard to Ozempic .  I was not able to reach the patient but did leave a HIPAA compliant voicemail with my direct phone number.  Channing DELENA Mealing, PharmD, DPLA

## 2023-12-22 ENCOUNTER — Telehealth: Payer: Self-pay

## 2023-12-22 ENCOUNTER — Other Ambulatory Visit (HOSPITAL_COMMUNITY): Payer: Self-pay

## 2023-12-22 MED ORDER — LORATADINE 10 MG PO TABS: 10.0000 mg | ORAL_TABLET | Freq: Every day | ORAL | 1 refills | Status: DC

## 2023-12-22 MED ORDER — OZEMPIC (0.25 OR 0.5 MG/DOSE) 2 MG/3ML ~~LOC~~ SOPN: 0.5000 mg | PEN_INJECTOR | SUBCUTANEOUS | 1 refills | Status: DC

## 2023-12-22 NOTE — Progress Notes (Signed)
   12/22/2023  Patient ID: Daniel Cook, male   DOB: Jan 23, 1956, 68 y.o.   MRN: [id number]  Contacted [facility] to verify they have patient's insurance on file and check on copay for Ozempic .  Patient insurance is not contracted with the pharmacy, so they were not able to process the Ozempic .  [facility] [address].   Daniel Cook, PharmD, DPLA

## 2023-12-23 ENCOUNTER — Telehealth: Payer: Self-pay

## 2023-12-23 NOTE — Progress Notes (Signed)
   12/23/2023  Patient ID: Daniel Cook, male   DOB: 1955-09-29, 69 y.o.   MRN: [id number]  Ozempic  0.5mg  weekly [facility] [place].  Contacted the pharmacy to verify insurance coverage and patient copay.  Medication is going through on insurance and manufacturer copay card for $25.  Channing DELENA Mealing, PharmD, DPLA

## 2023-12-30 ENCOUNTER — Encounter: Payer: Self-pay | Admitting: Family Medicine

## 2024-01-14 ENCOUNTER — Other Ambulatory Visit: Payer: Self-pay | Admitting: Family Medicine

## 2024-01-16 NOTE — Telephone Encounter (Signed)
 Too soon for refill, LRF 12/22/23 for 90 and 1 RF.  Requested Prescriptions  Pending Prescriptions Disp Refills   losartan  (COZAAR ) 100 MG tablet [Pharmacy Med Name: LOSARTAN  POTASSIUM 100 MG TAB] 90 tablet     Sig: TAKE 1 TABLET BY MOUTH DAILY     Cardiovascular:  Angiotensin Receptor Blockers Failed - 01/16/2024  9:25 AM      Failed - Cr in normal range and within 180 days    Creatinine, Ser  Date Value Ref Range Status  12/03/2023 1.28 (H) 0.76 - 1.27 mg/dL Final         Passed - K in normal range and within 180 days    Potassium  Date Value Ref Range Status  12/03/2023 4.2 3.5 - 5.2 mmol/L Final         Passed - Patient is not pregnant      Passed - Last BP in normal range    BP Readings from Last 1 Encounters:  12/09/23 110/70         Passed - Valid encounter within last 6 months    Recent Outpatient Visits           1 month ago Routine general medical examination at a health care facility   [facility], [place] P, DO   8 months ago Acute left ankle pain   [place] [facility] [place], Megan P, DO               ALLERGY RELIEF 10 MG tablet [Pharmacy Med Name: ALLERGY RELIEF 10 MG TAB] 90 tablet 1    Sig: TAKE ONE TABLET BY MOUTH ONCE DAILY     Ear, Nose, and Throat:  Antihistamines 2 Failed - 01/16/2024  9:25 AM      Failed - Cr in normal range and within 360 days    Creatinine, Ser  Date Value Ref Range Status  12/03/2023 1.28 (H) 0.76 - 1.27 mg/dL Final         Passed - Valid encounter within last 12 months    Recent Outpatient Visits           1 month ago Routine general medical examination at a health care facility   [facility], [place] P, DO   8 months ago Acute left ankle pain   [place] [facility] [place], [place], DO

## 2024-01-16 NOTE — Telephone Encounter (Signed)
 Requested medication (s) are due for refill today: routing for review  Requested medication (s) are on the active medication list: no  Last refill:  09/02/23  Future visit scheduled: yes  Notes to clinic:  historical medication     Requested Prescriptions  Pending Prescriptions Disp Refills   losartan  (COZAAR ) 100 MG tablet [Pharmacy Med Name: LOSARTAN  POTASSIUM 100 MG TAB] 90 tablet     Sig: TAKE 1 TABLET BY MOUTH DAILY     Cardiovascular:  Angiotensin Receptor Blockers Failed - 01/16/2024  9:26 AM      Failed - Cr in normal range and within 180 days    Creatinine, Ser  Date Value Ref Range Status  12/03/2023 1.28 (H) 0.76 - 1.27 mg/dL Final         Passed - K in normal range and within 180 days    Potassium  Date Value Ref Range Status  12/03/2023 4.2 3.5 - 5.2 mmol/L Final         Passed - Patient is not pregnant      Passed - Last BP in normal range    BP Readings from Last 1 Encounters:  12/09/23 110/70         Passed - Valid encounter within last 6 months    Recent Outpatient Visits           1 month ago Routine general medical examination at a health care facility   [facility] [place], [place] P, DO   8 months ago Acute left ankle pain   [place] [facility] [place], [place] P, DO              Refused Prescriptions Disp Refills   ALLERGY RELIEF 10 MG tablet [Pharmacy Med Name: ALLERGY RELIEF 10 MG TAB] 90 tablet 1    Sig: TAKE ONE TABLET BY MOUTH ONCE DAILY     Ear, Nose, and Throat:  Antihistamines 2 Failed - 01/16/2024  9:26 AM      Failed - Cr in normal range and within 360 days    Creatinine, Ser  Date Value Ref Range Status  12/03/2023 1.28 (H) 0.76 - 1.27 mg/dL Final         Passed - Valid encounter within last 12 months    Recent Outpatient Visits           1 month ago Routine general medical examination at a health care facility   [facility], [place] P, DO   8  months ago Acute left ankle pain   [place] [facility] [place], [place], DO

## 2024-02-15 ENCOUNTER — Other Ambulatory Visit: Payer: Self-pay | Admitting: Family Medicine

## 2024-02-17 NOTE — Telephone Encounter (Signed)
 Requested Prescriptions  Pending Prescriptions Disp Refills   Semaglutide ,0.25 or 0.5MG /DOS, (OZEMPIC , 0.25 OR 0.5 MG/DOSE,) 2 MG/3ML SOPN [Pharmacy Med Name: OZEMPIC  0.25-0.5 MG/DOSE PEN] 3 mL 2    Sig: INJECT 0.5 MG INTO THE SKIN ONCE A WEEK.     Endocrinology:  Diabetes - GLP-1 Receptor Agonists - semaglutide  Failed - 02/17/2024  9:52 AM      Failed - HBA1C in normal range and within 180 days    HB A1C (BAYER DCA - WAIVED)  Date Value Ref Range Status  12/03/2023 6.1 (H) 4.8 - 5.6 % Final    Comment:             Prediabetes: 5.7 - 6.4          Diabetes: >6.4          Glycemic control for adults with diabetes: <7.0          Failed - Cr in normal range and within 360 days    Creatinine, Ser  Date Value Ref Range Status  12/03/2023 1.28 (H) 0.76 - 1.27 mg/dL Final         Passed - Valid encounter within last 6 months    Recent Outpatient Visits           2 months ago Routine general medical examination at a health care facility   [facility], [place] P, DO   9 months ago Acute left ankle pain   [place] [facility] [place], [place], DO

## 2024-03-08 ENCOUNTER — Other Ambulatory Visit: Payer: Self-pay | Admitting: Family Medicine

## 2024-03-10 ENCOUNTER — Other Ambulatory Visit: Payer: Self-pay | Admitting: Family Medicine

## 2024-03-10 ENCOUNTER — Encounter: Payer: Self-pay | Admitting: Family Medicine

## 2024-03-10 ENCOUNTER — Ambulatory Visit: Admitting: Family Medicine

## 2024-03-10 VITALS — BP 127/69 | HR 46 | Temp 98.0°F | Ht 68.0 in | Wt 214.0 lb

## 2024-03-10 DIAGNOSIS — E7800 Pure hypercholesterolemia, unspecified: Secondary | ICD-10-CM | POA: Diagnosis not present

## 2024-03-10 DIAGNOSIS — Z7985 Long-term (current) use of injectable non-insulin antidiabetic drugs: Secondary | ICD-10-CM

## 2024-03-10 DIAGNOSIS — M1000 Idiopathic gout, unspecified site: Secondary | ICD-10-CM

## 2024-03-10 DIAGNOSIS — M51369 Other intervertebral disc degeneration, lumbar region without mention of lumbar back pain or lower extremity pain: Secondary | ICD-10-CM

## 2024-03-10 DIAGNOSIS — E118 Type 2 diabetes mellitus with unspecified complications: Secondary | ICD-10-CM | POA: Diagnosis not present

## 2024-03-10 LAB — BAYER DCA HB A1C WAIVED: HB A1C (BAYER DCA - WAIVED): 5.3 % (ref 4.8–5.6)

## 2024-03-10 MED ORDER — LORATADINE 10 MG PO TABS: 10.0000 mg | ORAL_TABLET | Freq: Every day | ORAL | 0 refills | Status: AC

## 2024-03-10 MED ORDER — GABAPENTIN 300 MG PO CAPS: 600.0000 mg | ORAL_CAPSULE | Freq: Three times a day (TID) | ORAL | 0 refills | Status: AC

## 2024-03-10 MED ORDER — EZETIMIBE 10 MG PO TABS: 10.0000 mg | ORAL_TABLET | Freq: Every day | ORAL | 0 refills | Status: AC

## 2024-03-10 MED ORDER — ALLOPURINOL 300 MG PO TABS: 300.0000 mg | ORAL_TABLET | Freq: Every day | ORAL | 0 refills | Status: DC

## 2024-03-10 MED ORDER — COLCHICINE 0.6 MG PO TABS: 0.6000 mg | ORAL_TABLET | Freq: Every day | ORAL | 0 refills | Status: DC | PRN

## 2024-03-10 MED ORDER — ALLOPURINOL 300 MG PO TABS: 300.0000 mg | ORAL_TABLET | Freq: Every day | ORAL | 0 refills | Status: AC

## 2024-03-10 MED ORDER — OZEMPIC (0.25 OR 0.5 MG/DOSE) 2 MG/3ML ~~LOC~~ SOPN: 0.2500 mg | PEN_INJECTOR | SUBCUTANEOUS | 1 refills | Status: AC

## 2024-03-10 MED ORDER — EZETIMIBE 10 MG PO TABS: 10.0000 mg | ORAL_TABLET | Freq: Every day | ORAL | 0 refills | Status: DC

## 2024-03-10 MED ORDER — ATORVASTATIN CALCIUM 80 MG PO TABS: 80.0000 mg | ORAL_TABLET | Freq: Every day | ORAL | 0 refills | Status: DC

## 2024-03-10 MED ORDER — LOSARTAN POTASSIUM 100 MG PO TABS: 100.0000 mg | ORAL_TABLET | Freq: Every day | ORAL | 0 refills | Status: AC

## 2024-03-10 MED ORDER — ATORVASTATIN CALCIUM 80 MG PO TABS: 80.0000 mg | ORAL_TABLET | Freq: Every day | ORAL | 0 refills | Status: AC

## 2024-03-10 MED ORDER — LOSARTAN POTASSIUM 100 MG PO TABS: 100.0000 mg | ORAL_TABLET | Freq: Every day | ORAL | 0 refills | Status: DC

## 2024-03-10 MED ORDER — NAPROXEN 500 MG PO TABS: ORAL_TABLET | ORAL | 0 refills | Status: AC

## 2024-03-10 MED ORDER — GABAPENTIN 300 MG PO CAPS: 600.0000 mg | ORAL_CAPSULE | Freq: Three times a day (TID) | ORAL | 0 refills | Status: DC

## 2024-03-10 MED ORDER — OMEPRAZOLE 20 MG PO CPDR: 20.0000 mg | DELAYED_RELEASE_CAPSULE | Freq: Every day | ORAL | 0 refills | Status: AC

## 2024-03-10 MED ORDER — LORATADINE 10 MG PO TABS: 10.0000 mg | ORAL_TABLET | Freq: Every day | ORAL | 0 refills | Status: DC

## 2024-03-10 MED ORDER — SIMETHICONE 40 MG/0.6ML PO SUSP: 40.0000 mg | Freq: Four times a day (QID) | ORAL | 0 refills | Status: AC | PRN

## 2024-03-10 MED ORDER — COLCHICINE 0.6 MG PO TABS: 0.6000 mg | ORAL_TABLET | Freq: Every day | ORAL | 0 refills | Status: AC | PRN

## 2024-03-10 MED ORDER — NAPROXEN 500 MG PO TABS: ORAL_TABLET | ORAL | 0 refills | Status: DC

## 2024-03-10 MED ORDER — SIMETHICONE 40 MG/0.6ML PO SUSP: 40.0000 mg | Freq: Four times a day (QID) | ORAL | 0 refills | Status: DC | PRN

## 2024-03-10 MED ORDER — OMEPRAZOLE 20 MG PO CPDR: 20.0000 mg | DELAYED_RELEASE_CAPSULE | Freq: Every day | ORAL | 0 refills | Status: DC

## 2024-03-10 NOTE — Progress Notes (Signed)
 BP 127/69   Pulse (!) 46   Temp 98 F (36.7 C) (Oral)   Ht 5' 8 (1.727 m)   Wt 214 lb (97.1 kg)   SpO2 95%   BMI 32.54 kg/m    Subjective:    Patient ID: KARION CUDD, male    DOB: 1956/01/17, 68 y.o.   MRN: [id number]  HPI: SHARONE ALMOND is a 68 y.o. male  Chief Complaint  Patient presents with   Diabetes   Hypertension   DIABETES Hypoglycemic episodes:no Polydipsia/polyuria: no Visual disturbance: no Chest pain: no Paresthesias: no Glucose Monitoring: yes Taking Insulin?: no Blood Pressure Monitoring: not checking Retinal Examination: Up to Date Foot Exam: Up to Date Diabetic Education: Completed Pneumovax: Up to Date Influenza: Up to Date Aspirin: yes   Relevant past medical, surgical, family and social history reviewed and updated as indicated. Interim medical history since our last visit reviewed. Allergies and medications reviewed and updated.  Review of Systems  Constitutional: Negative.   Respiratory: Negative.    Cardiovascular: Negative.   Musculoskeletal: Negative.   Neurological: Negative.   Psychiatric/Behavioral: Negative.      Per HPI unless specifically indicated above     Objective:    BP 127/69   Pulse (!) 46   Temp 98 F (36.7 C) (Oral)   Ht 5' 8 (1.727 m)   Wt 214 lb (97.1 kg)   SpO2 95%   BMI 32.54 kg/m   Wt Readings from Last 3 Encounters:  03/10/24 214 lb (97.1 kg)  12/09/23 216 lb 6 oz (98.1 kg)  12/03/23 221 lb 6.4 oz (100.4 kg)    Physical Exam Vitals and nursing note reviewed.  Constitutional:      General: He is not in acute distress.    Appearance: Normal appearance. He is not ill-appearing, toxic-appearing or diaphoretic.  HENT:     Head: Normocephalic and atraumatic.     Right Ear: External ear normal.     Left Ear: External ear normal.     Nose: Nose normal.     Mouth/Throat:     Mouth: Mucous membranes are moist.     Pharynx: Oropharynx is clear.  Eyes:     General: No scleral icterus.        Right eye: No discharge.        Left eye: No discharge.     Extraocular Movements: Extraocular movements intact.     Conjunctiva/sclera: Conjunctivae normal.     Pupils: Pupils are equal, round, and reactive to light.  Cardiovascular:     Rate and Rhythm: Normal rate and regular rhythm.     Pulses: Normal pulses.     Heart sounds: Normal heart sounds. No murmur heard.    No friction rub. No gallop.  Pulmonary:     Effort: Pulmonary effort is normal. No respiratory distress.     Breath sounds: Normal breath sounds. No stridor. No wheezing, rhonchi or rales.  Chest:     Chest wall: No tenderness.  Musculoskeletal:        General: Normal range of motion.     Cervical back: Normal range of motion and neck supple.  Skin:    General: Skin is warm and dry.     Capillary Refill: Capillary refill takes less than 2 seconds.     Coloration: Skin is not jaundiced or pale.     Findings: No bruising, erythema, lesion or rash.  Neurological:     General: No focal deficit present.  Mental Status: He is alert and oriented to person, place, and time. Mental status is at baseline.  Psychiatric:        Mood and Affect: Mood normal.        Behavior: Behavior normal.        Thought Content: Thought content normal.        Judgment: Judgment normal.     Results for orders placed or performed in visit on 12/15/23  HM DIABETES EYE EXAM   Collection Time: 08/26/23  9:41 AM  Result Value Ref Range   HM Diabetic Eye Exam        Assessment & Plan:   Problem List Items Addressed This Visit       Endocrine   Controlled type 2 diabetes mellitus with complication, without long-term current use of insulin (HCC) - Primary   Doing great with A1c of 5.3! Continue current regimen. Continue to monitor. Call with any concerns.       Relevant Medications   Semaglutide ,0.25 or 0.5MG /DOS, (OZEMPIC , 0.25 OR 0.5 MG/DOSE,) 2 MG/3ML SOPN   atorvastatin  (LIPITOR) 80 MG tablet   losartan  (COZAAR ) 100 MG  tablet   Other Relevant Orders   Bayer DCA Hb A1c Waived     Musculoskeletal and Integument   DDD (degenerative disc disease), lumbar   Relevant Medications   gabapentin  (NEURONTIN ) 300 MG capsule   naproxen  (NAPROSYN ) 500 MG tablet     Other   High cholesterol   Relevant Medications   atorvastatin  (LIPITOR) 80 MG tablet   ezetimibe  (ZETIA ) 10 MG tablet   losartan  (COZAAR ) 100 MG tablet   Idiopathic gout   Relevant Medications   allopurinol  (ZYLOPRIM ) 300 MG tablet   naproxen  (NAPROSYN ) 500 MG tablet     Follow up plan: Return in about 3 months (around 06/08/2024).

## 2024-03-10 NOTE — Assessment & Plan Note (Signed)
Doing great with A1c of 5.3. Continue current regimen. Continue to monitor. Call with any concerns.

## 2024-03-12 LAB — HM DIABETES EYE EXAM

## 2024-03-16 ENCOUNTER — Ambulatory Visit: Payer: Self-pay | Admitting: Family Medicine

## 2024-03-16 ENCOUNTER — Encounter: Payer: Self-pay | Admitting: Family Medicine

## 2024-04-02 ENCOUNTER — Encounter: Payer: Self-pay | Admitting: Family Medicine

## 2024-04-02 NOTE — Telephone Encounter (Signed)
 Called patient and advised based on symptoms that he should report to the ED.

## 2024-04-02 NOTE — Telephone Encounter (Signed)
 Agree with ER.

## 2024-05-09 NOTE — Discharge Instructions (Signed)

## 2024-05-12 ENCOUNTER — Inpatient Hospital Stay
Admission: RE | Admit: 2024-05-12 | Discharge: 2024-05-12 | Disposition: A | Source: Ambulatory Visit | Attending: Orthopedic Surgery | Admitting: Orthopedic Surgery

## 2024-05-12 ENCOUNTER — Other Ambulatory Visit: Payer: Self-pay

## 2024-05-12 VITALS — BP 130/58 | HR 51 | Temp 98.2°F | Resp 18 | Ht 68.0 in | Wt 211.0 lb

## 2024-05-12 DIAGNOSIS — I10 Essential (primary) hypertension: Secondary | ICD-10-CM | POA: Insufficient documentation

## 2024-05-12 DIAGNOSIS — Z01812 Encounter for preprocedural laboratory examination: Secondary | ICD-10-CM | POA: Insufficient documentation

## 2024-05-12 DIAGNOSIS — Z01810 Encounter for preprocedural cardiovascular examination: Secondary | ICD-10-CM

## 2024-05-12 DIAGNOSIS — M1712 Unilateral primary osteoarthritis, left knee: Secondary | ICD-10-CM | POA: Insufficient documentation

## 2024-05-12 DIAGNOSIS — E118 Type 2 diabetes mellitus with unspecified complications: Secondary | ICD-10-CM | POA: Insufficient documentation

## 2024-05-12 HISTORY — DX: Polyp of colon: K63.5

## 2024-05-12 HISTORY — DX: Type 2 diabetes mellitus without complications: E11.9

## 2024-05-12 HISTORY — DX: Gastritis, unspecified, without bleeding: K29.70

## 2024-05-12 HISTORY — DX: Other dysphagia: R13.19

## 2024-05-12 LAB — COMPREHENSIVE METABOLIC PANEL WITH GFR
ALT: 12 U/L (ref 0–44)
AST: 17 U/L (ref 15–41)
Albumin: 4.2 g/dL (ref 3.5–5.0)
Alkaline Phosphatase: 69 U/L (ref 38–126)
Anion gap: 10 (ref 5–15)
BUN: 25 mg/dL — ABNORMAL HIGH (ref 8–23)
CO2: 27 mmol/L (ref 22–32)
Calcium: 9.3 mg/dL (ref 8.9–10.3)
Chloride: 103 mmol/L (ref 98–111)
Creatinine, Ser: 0.98 mg/dL (ref 0.61–1.24)
GFR, Estimated: >60 mL/min
Glucose, Bld: 110 mg/dL — ABNORMAL HIGH (ref 70–99)
Potassium: 3.9 mmol/L (ref 3.5–5.1)
Sodium: 140 mmol/L (ref 135–145)
Total Bilirubin: 0.4 mg/dL (ref 0.0–1.2)
Total Protein: 7.1 g/dL (ref 6.5–8.1)

## 2024-05-12 LAB — CBC
HCT: 35.5 % — ABNORMAL LOW (ref 39.0–52.0)
Hemoglobin: 11.9 g/dL — ABNORMAL LOW (ref 13.0–17.0)
MCH: 31.4 pg (ref 26.0–34.0)
MCHC: 33.5 g/dL (ref 30.0–36.0)
MCV: 93.7 fL (ref 80.0–100.0)
Platelets: 239 10*3/uL (ref 150–400)
RBC: 3.79 MIL/uL — ABNORMAL LOW (ref 4.22–5.81)
RDW: 14.3 % (ref 11.5–15.5)
WBC: 10.5 10*3/uL (ref 4.0–10.5)
nRBC: 0 % (ref 0.0–0.2)

## 2024-05-12 LAB — C-REACTIVE PROTEIN: CRP: <0.5 mg/dL

## 2024-05-12 LAB — URINALYSIS, ROUTINE W REFLEX MICROSCOPIC
Bilirubin Urine: NEGATIVE
Glucose, UA: NEGATIVE mg/dL
Hgb urine dipstick: NEGATIVE
Ketones, ur: NEGATIVE mg/dL
Leukocytes,Ua: NEGATIVE
Nitrite: NEGATIVE
Protein, ur: NEGATIVE mg/dL
Specific Gravity, Urine: 1.024 (ref 1.005–1.030)
pH: 5 (ref 5.0–8.0)

## 2024-05-12 LAB — SURGICAL PCR SCREEN
MRSA, PCR: NEGATIVE
Staphylococcus aureus: NEGATIVE

## 2024-05-12 LAB — SEDIMENTATION RATE: Sed Rate: 32 mm/h — ABNORMAL HIGH (ref 0–20)

## 2024-05-12 LAB — HEMOGLOBIN A1C
Hgb A1c MFr Bld: 5.8 % — ABNORMAL HIGH (ref 4.8–5.6)
Mean Plasma Glucose: 119.76 mg/dL

## 2024-05-12 NOTE — Patient Instructions (Addendum)
 Your procedure is scheduled on:   MONDAY  FEBRUARY 16  Report to the Registration Desk on the 1st floor of the [facility] To find out your arrival time, please call [phone] between 1PM - 3PM on:  FRIDAY  FEBRUARY 13  If your arrival time is 6:00 am, do not arrive before that time as the [facility] entrance doors do not open until 6:00 am.  REMEMBER: Instructions that are not followed completely may result in serious medical risk, up to and including death; or upon the discretion of your surgeon and anesthesiologist your surgery may need to be rescheduled.  Do not eat food after midnight the night before surgery.  No gum chewing or hard candies.  You may however, drink WATER  up to 4:30 the morning of your surgery. Do not drink anything after 4:30 the morning of your surgery.   In addition, your doctor has ordered for you to drink the provided:  Gatorade G2 Drinking this carbohydrate drink up to two hours before surgery helps to reduce insulin resistance and improve patient outcomes. Please complete drinking 2 hours before scheduled arrival time.  One week prior to surgery:  STARTING MONDAY FEBRUARY 9  Stop Anti-inflammatories (NSAIDS) such as Advil, Aleve , Ibuprofen, Motrin, Naproxen , Naprosyn  and Aspirin based products such as Excedrin, Goody's Powder, BC Powder. Stop ANY OVER THE COUNTER supplements until after surgery. Ascorbic Acid (VITAMIN C)  Cholecalciferol (VITAMIN D3)  Glucosamine-Chondroit-Biofl-Mn  loratadine  (CLARITIN )  Omega-3 Fatty Acids (FISH OIL)  Vitamin E  Zinc  You may however, continue to take Tylenol  if needed for pain up until the day of surgery.  **Follow guidelines for insulin and diabetes medications.** Semaglutide  (OZEMPIC  ) Hold 7 days prior to surgery, last dose Sunday February 8   Continue taking all of your other prescription medications up until the day of surgery.  ON THE MORNING OF SURGERY DO NOT TAKE ANY MEDICATIONS  Use inhalers on the  day of surgery and bring to the hospital. albuterol  (VENTOLIN  HFA)   No Alcohol for 24 hours before or after surgery.  No chewable tobacco products for at least 6 hours before surgery.    Do not use any recreational drugs for at least a week (preferably 2 weeks) before your surgery.  Please be advised that the combination of cocaine and anesthesia may have negative outcomes, up to and including death. If you test positive for cocaine, your surgery will be cancelled.  On the morning of surgery brush your teeth with toothpaste and water , you may rinse your mouth with mouthwash if you wish. Do not swallow any toothpaste or mouthwash.  Use CHG Soap as directed on instruction sheet.  Do not wear jewelry, make-up, hairpins, clips or nail polish.  For welded (permanent) jewelry: bracelets, anklets, waist bands, etc.  Please have this removed prior to surgery.  If it is not removed, there is a chance that hospital personnel will need to cut it off on the day of surgery.  Do not wear lotions, powders, or perfumes.   Do not shave body hair from the neck down 48 hours before surgery.  Contact lenses, hearing aids and dentures may not be worn into surgery.  Do not bring valuables to the hospital. [facility] is not responsible for any missing/lost belongings or valuables.   Notify your doctor if there is any change in your medical condition (cold, fever, infection).  Wear comfortable clothing (specific to your surgery type) to the hospital.  After surgery, you can  help prevent lung complications by doing breathing exercises.  Take deep breaths and cough every 1-2 hours. Your doctor may order a device called an Incentive Spirometer to help you take deep breaths.  If you are being admitted to the hospital overnight, leave your suitcase in the car. After surgery it may be brought to your room.  In case of increased patient census, it may be necessary for you, the patient, to continue your  postoperative care in the Same Day Surgery department.  If you are being discharged the day of surgery, you will not be allowed to drive home. You will need a responsible individual to drive you home and stay with you for 24 hours after surgery.   If you are taking public transportation, you will need to have a responsible individual with you.  Please call the Pre-admissions Testing Dept. at [phone] if you have any questions about these instructions.  Surgery Visitation Policy:  Patients having surgery or a procedure may have two visitors.  Children under the age of 20 must have an adult with them who is not the patient.  Inpatient Visitation:    Visiting hours are 7 a.m. to 8 p.m. Up to four visitors are allowed at one time in a patient room. The visitors may rotate out with other people during the day.  One visitor age 70 or older may stay with the patient overnight and must be in the room by 8 p.m.   Merchandiser, Retail to address health-related social needs:  [email or web address]                                                                                                              Preparing for Surgery with CHLORHEXIDINE GLUCONATE (CHG) Soap  Chlorhexidine Gluconate (CHG) Soap  o An antiseptic cleaner that kills germs and bonds with the skin to continue killing germs even after washing  o Used for showering the night before surgery and morning of surgery  Before surgery, you can play an important role by reducing the number of germs on your skin.  CHG (Chlorhexidine gluconate) soap is an antiseptic cleanser which kills germs and bonds with the skin to continue killing germs even after washing.  Please do not use if you have an allergy to CHG or antibacterial soaps. If your skin becomes reddened/irritated stop using the CHG.  1. Shower the NIGHT BEFORE SURGERY with CHG soap.  2. If you choose to wash your hair, wash your hair first as  usual with your normal shampoo.  3. After shampooing, rinse your hair and body thoroughly to remove the shampoo.  4. Use CHG as you would any other liquid soap. You can apply CHG directly to the skin and wash gently with a clean washcloth.  5. Apply the CHG soap to your body only from the neck down. Do not use on open wounds or open sores. Avoid contact with your eyes, ears, mouth, and genitals (private parts). Wash face and genitals (private parts) with your normal soap.  6.  Wash thoroughly, paying special attention to the area where your surgery will be performed.  7. Thoroughly rinse your body with warm water .  8. Do not shower/wash with your normal soap after using and rinsing off the CHG soap.  9. Do not use lotions, oils, etc., after showering with CHG.  10. Pat yourself dry with a clean towel.  11. Wear clean pajamas to bed the night before surgery.  12. Place clean sheets on your bed the night of your shower and do not sleep with pets.  13. Do not apply any deodorants/lotions/powders.  14. Please wear clean clothes to the hospital.  15. Remember to brush your teeth with your regular toothpaste.      How to Use an Incentive Spirometer  An incentive spirometer is a tool that measures how well you are filling your lungs with each breath. Learning to take long, deep breaths using this tool can help you keep your lungs clear and active. This may help to reverse or lessen your chance of developing breathing (pulmonary) problems, especially infection. You may be asked to use a spirometer: After a surgery. If you have a lung problem or a history of smoking. After a long period of time when you have been unable to move or be active. If the spirometer includes an indicator to show the highest number that you have reached, your health care provider or respiratory therapist will help you set a goal. Keep a log of your progress as told by your health care provider. What are the  risks? Breathing too quickly may cause dizziness or cause you to pass out. Take your time so you do not get dizzy or light-headed. If you are in pain, you may need to take pain medicine before doing incentive spirometry. It is harder to take a deep breath if you are having pain. How to use your incentive spirometer  Sit up on the edge of your bed or on a chair. Hold the incentive spirometer so that it is in an upright position. Before you use the spirometer, breathe out normally. Place the mouthpiece in your mouth. Make sure your lips are closed tightly around it. Breathe in slowly and as deeply as you can through your mouth, causing the piston or the ball to rise toward the top of the chamber. Hold your breath for 3-5 seconds, or for as long as possible. If the spirometer includes a coach indicator, use this to guide you in breathing. Slow down your breathing if the indicator goes above the marked areas. Remove the mouthpiece from your mouth and breathe out normally. The piston or ball will return to the bottom of the chamber. Rest for a few seconds, then repeat the steps 10 or more times. Take your time and take a few normal breaths between deep breaths so that you do not get dizzy or light-headed. Do this every 1-2 hours when you are awake. If the spirometer includes a goal marker to show the highest number you have reached (best effort), use this as a goal to work toward during each repetition. After each set of 10 deep breaths, cough a few times. This will help to make sure that your lungs are clear. If you have an incision on your chest or abdomen from surgery, place a pillow or a rolled-up towel firmly against the incision when you cough. This can help to reduce pain while taking deep breaths and coughing. General tips When you are able to get out of bed:  Walk around often. Continue to take deep breaths and cough in order to clear your lungs. Keep using the incentive spirometer until  your health care provider says it is okay to stop using it. If you have been in the hospital, you may be told to keep using the spirometer at home. Contact a health care provider if: You are having difficulty using the spirometer. You have trouble using the spirometer as often as instructed. Your pain medicine is not giving enough relief for you to use the spirometer as told. You have a fever. Get help right away if: You develop shortness of breath. You develop a cough with bloody mucus from the lungs. You have fluid or blood coming from an incision site after you cough. Summary An incentive spirometer is a tool that can help you learn to take long, deep breaths to keep your lungs clear and active. You may be asked to use a spirometer after a surgery, if you have a lung problem or a history of smoking, or if you have been inactive for a long period of time. Use your incentive spirometer as instructed every 1-2 hours while you are awake. If you have an incision on your chest or abdomen, place a pillow or a rolled-up towel firmly against your incision when you cough. This will help to reduce pain. Get help right away if you have shortness of breath, you cough up bloody mucus, or blood comes from your incision when you cough. This information is not intended to replace advice given to you by your health care provider. Make sure you discuss any questions you have with your health care provider. Document Revised: 06/14/2019 Document Reviewed: 06/14/2019 Elsevier Patient Education  2023 [employer]            Preoperative Educational Videos for Total Hip, Knee and Shoulder Replacements  To better prepare for surgery, please view our videos that explain the physical activity and discharge planning required to have the best surgical recovery at [facility].  [email or web address]  Questions? Call [phone]  or email [email or web address]

## 2024-05-12 NOTE — Progress Notes (Signed)
 HPI The patient is a 69 year old who presents today for follow-up of acute low back pain with radiation into the right buttock, lateral thigh to the knee associated with localized knee pain.  His daughter had a baby and he took a nap on a hospital bed and had subsequent flaring of his pain.  His symptoms initially began with pain in the buttock and subsequently progressed down the leg.  His symptoms are worsened with bending, lifting, activity, standing, and walking.  He has difficulty finding a position that provides relief. Chiropractic manipulation and home exercises have not been beneficial.    Medications have included prednisone  10 mg 12 day taper (no relief), ibuprofen (minimal relief), tizanidine (half tablet causes cognitive impairment), gabapentin  300 mg 3 times daily (mild relief), tramadol (no relief), goody powders (mild relief).  He was previously followed for right parascapular pain consistent with a cervical radiculitis and muscular spasm.  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  He is also followed for neck pain with extension into the right shoulder, triceps and ulnar aspect of the forearm into digits 3 through 5 that began late November 2022 without injury.  He notes a weakness in the right pinky.  He describes his pain as moderate to severe sharp, aching, tight and constant.  He notes a feeling of numbness and tingling and this is worsened with cervical rotation.  Heat provides mild benefit.  Norco provides mild benefit.  He was last evaluated on 02/09/2024 at which time he was doing well.  He was to continue with gabapentin  and hydrocodone  3 times a day as needed #70.  He was to continue with home exercise program.  Since this time he has been evaluated in the emergency department for lightheadedness and has been evaluated by cardiology.  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  At today's visit he reports that he has been doing well over the last several weeks. He was evaluated in the ED in late December and he was concerned that he was having a stroke and he was taking licorice root and thyme extract making him dizzy. He is no longer taking this. He denies any difficulty with fine motor task or feeling of gait imbalance at this time.  Overall he states he is in his normal state of health and on exam today he has full strength in the upper and lower extremities.  We reviewed his MRI together of the cervical spine we discussed meeting with neurosurgery and the chance that he was to experience symptoms as he does have moderate severe stenosis due to disc herniation at C3-4.  He continues with hydrocodone  and keeps his medication as a place and does not share with others.  He continues to work on weight loss and has downloaded an app trying to increase his steps during the day.  We have written that he is not to lift greater than 25 pounds from the floor.  The patient has signed a metallurgist (08/05/2023).  Side effects of narcotics have been discussed patient verbalized understanding of regulation of pain receptors. The [place]  Narcotic Database was reviewed today.  UDS TOXASSURE 13 was obtained on  02/09/2024 (appropriate) Procedures: 04/05/13: Right levator scapula and rhomboid trigger point injection (mild to moderate relief)   Past Medical History:  Diagnosis Date   Arthritis    lower back   Bradycardia  COPD (chronic obstructive pulmonary disease) (CMS/HHS-HCC) 05/08/2021   Dyslipidemia    GERD (gastroesophageal reflux disease)    Hypertension    Idiopathic gout 10/26/2014   Controlled no side effects and no further gout flares     OSA (obstructive sleep apnea) 12/29/2013   Postinflammatory pulmonary fibrosis (CMS/HHS-HCC) 05/08/2021    Past Surgical History:  Procedure Laterality Date   Left knee surgery       Social History   Socioeconomic History   Marital status: Divorced   Number of children: 1   Years of education: 13  Occupational History   Occupation: Environmental Education Officer - Counselling Psychologist  Tobacco Use   Smoking status: Former   Smokeless tobacco: Former   Tobacco comments:    dips snuff  Vaping Use   Vaping status: Every Day  Substance and Sexual Activity   Alcohol use: Yes    Alcohol/week: 4.0 standard drinks of alcohol    Types: 4 Cans of beer per week    Comment: socially 3-4 beers a week   Drug use: Defer   Sexual activity: Defer    Partners: Female   Social Drivers of Corporate Investment Banker Strain: Low Risk  (05/11/2024)   Overall Financial Resource Strain (CARDIA)    Difficulty of Paying Living Expenses: Not very hard  Food Insecurity: No Food Insecurity (05/11/2024)   Hunger Vital Sign    Worried About Running Out of Food in the Last Year: Never true    Ran Out of Food in the Last Year: Never true  Transportation Needs: No Transportation Needs (05/11/2024)   PRAPARE - Administrator, Civil Service (Medical): No    Lack of Transportation (Non-Medical): No    Current Outpatient Medications on File Prior to Visit  Medication Sig Dispense Refill   allopurinol  (ZYLOPRIM ) 300 MG tablet Take 300 mg by mouth once daily.     amLODIPine  (NORVASC ) 10 MG tablet Take 1 tablet by mouth once daily     ascorbic acid, vitamin C, (VITAMIN C) 1000 MG tablet Take 1,000 mg by mouth once daily     aspirin 81 MG EC tablet Take 81 mg by mouth once daily.     atorvastatin  (LIPITOR) 80 MG tablet Take 80 mg by mouth once daily     BREZTRI  AEROSPHERE 160-9-4.8 mcg/actuation inhaler Inhale 2 inhalations into the lungs 2 (two) times daily     cholecalciferol (VITAMIN D3) 1000 unit capsule Take 1 capsule by mouth once daily     colchicine  (COLCRYS ) 0.6 mg tablet Take 0.6 mg by mouth once daily. Take 2 tablets (1.2mg ) by mouth at first sign of gout flare  followed by 1 tablet (0.6mg ) after 1 hour. (Max 1.8mg  within 1 hour)     ezetimibe  (ZETIA ) 10 mg tablet Take 10 mg by mouth once daily.       gabapentin  (NEURONTIN ) 300 MG capsule Take 2 capsules (600 mg total) by mouth 3 (three) times daily 2 po tid 540 capsule 3   hydroCHLOROthiazide  (HYDRODIURIL ) 25 MG tablet Take 1 tablet by mouth once daily     icosapent  ethyL (VASCEPA ) 1 gram capsule Take 2 g by mouth 2 (two) times daily     loratadine  (CLARITIN ) 10 mg tablet Take 1 tablet by mouth once daily     losartan  (COZAAR ) 50 MG tablet Take 1 tablet (50 mg total) by mouth once daily 90 tablet 4   magnesium gluconate (MAGONATE) 27.5 mg magne- sium (500 mg) tablet Take  500 mg by mouth once daily     naproxen  (NAPROSYN ) 500 MG tablet Take 1 tablet by mouth 2 (two) times daily with meals     olmesartan  (BENICAR ) 20 MG tablet Take 20 mg by mouth once daily     omega-3 fatty acids-fish oil 360-1,200 mg Cap Take 1 capsule by mouth once daily     omeprazole  (PRILOSEC OTC) 20 MG tablet Take 20 mg by mouth once daily.     simethicone  (MYLICON) 40 mg/0.6 mL oral suspension Take 40 mg by mouth 4 (four) times daily as needed for Flatulence     VENTOLIN  HFA 90 mcg/actuation inhaler Inhale 1 inhalation into the lungs as needed.       vitamin E acetate 134 mg (200 unit) Cap Take 1 tablet by mouth once daily     zinc citrate-phytase (ZYTAZE) 25-500 mg capsule Take 1 capsule by mouth once daily     naloxone (NARCAN) 4 mg/actuation nasal spray Place 1 spray (4 mg total) into one nostril once as needed (if not breathing or overdose is suspected.) for up to 1 dose Give 2nd dose in 5-10 min if not responding or if sx return for up to 1 dose. 2 each 1   predniSONE  (DELTASONE ) 10 MG tablet 6 day taper - Take as directed (Patient not taking: Reported on 05/12/2024) 21 tablet 0   tiZANidine (ZANAFLEX) 4 MG tablet TAKE 1 TABLET BY MOUTH TWICE A DAY AS NEEDED (Patient not taking: Reported on 05/12/2024) 60 tablet  2   No current facility-administered medications on file prior to visit.    Allergies as of 05/12/2024   (No Known Allergies)    ROS More than 10 system, review of system form was given to the patient to fill out and has been signed by Dr. Avanell and scanned into the patient's chart.   Vital signs Vitals:   05/12/24 1003  BP: (!) 154/75  Pulse: (!) 46  Temp: 36.5 C (97.7 F)  TempSrc: Oral  Weight: 96.2 kg (212 lb)  Height: 172.7 cm (5' 7.99)  PainSc:   2  PainLoc: Back    Exam General: Alert oriented well-nourished no distress.  Cervical exam (performed 05/12/2024) Upon inspection the rashes or scars.  He has mild tenderness of palpation to the right trapezius ridge.  Cervical rotation produces right parascapular pain and Spurling's maneuver to the left produces significant radiating pain down the right upper extremity.  Upper extremity exam He has 5/5 strength in bilateral wrist extensors, biceps, triceps, deltoids, shoulder internal and external rotators.  There is significant weakness of the right fifth digit.  Sensation intact to light touch bilaterally.  Unable to elicit bilateral bicep and tricep and brachialis reflexes bilaterally.  Negative Hoffmann's bilaterally.  Empty can test is negative bilaterally.   Lumbosacral Exam (performed 07/30/2022) On inspection no scar is noted.  On palpation is without notable tenderness to the lumbosacral paraspinal musculature.  Lumbar extension with rotation does not produce notable pain.  Lower Extremity Exam (05/12/2024) He has 5/5 strength of bilateral dorsiflexors, knee extensors, and hip flexors.  Sensation light touch is intact throughout bilateral lower extremities.  He has trace bilateral patellar reflexes.  He does not have ankle clonus.  Straight leg raise is negative bilaterally.    Radiographic Data Impression 1. 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 No definite parenchymal spinal cord signal abnormality, though  poor image quality limits evaluation. 2. Nonaggressive-appearing 6 mm intraosseous lesion in the left posterior aspect of the C7 vertebral body. In the absence of referable symptoms, imaging follow-up of this lesion is not felt necessary. Narrative EXAM: Magnetic resonance imaging, spinal canal and contents, cervical without and with contrast material. DATE: 04/03/2024 8:23 PM ACCESSION: [id number] UN DICTATED: 04/03/2024 9:51 PM INTERPRETATION LOCATION: [facility] CLINICAL INDICATION: 69 years old Male with headache, left arm numbness   COMPARISON: None TECHNIQUE: Multiplanar MRI was performed through the cervical spine without and with intravenous contrast. FINDINGS: Motion artifact limits evaluation of multiple sequences, including axial T2-weighted images. This limits evaluation of cervical spinal canal and neuroforaminal stenosis. There is up to 6 mm anterolisthesis of C7 on T1. Vertebral body heights are maintained. No marrow edema.  A well-circumscribed 6 mm round lesion in the left posterior aspect of the C7 vertebral body demonstrates hyperintense signal on T2-weighted images and hypointense signal on T1-weighted images with bright peripheral enhancement. The lesion has a rim of peripheral sclerosis and does not violate the adjacent superior endplate or posterior vertebral cortex. Motion artifact limits evaluation of spinal cord parenchyma; no definite focal cord signal abnormality identified. No abnormal enhancement identified within the spinal canal. C2-C3: Mild facet arthropathy. No spinal canal stenosis. Mild left neuroforaminal stenosis. C3-C4: Disc bulge and disc extrusion with superior and inferior migration of disc material. Left greater than right facet arthropathy. Moderate to severe spinal canal stenosis. Severe left and moderate  to severe right neuroforaminal stenosis. C4-C5: Disc bulge and bilateral facet arthropathy. Mild spinal canal stenosis. Severe bilateral neuroforaminal stenosis. C5-C6: Disc bulge and facet arthropathy. Central disc protrusion or extrusion. Moderate spinal canal stenosis. Severe bilateral neuroforaminal stenosis. C6-C7: Central disc protrusion. Left greater than right facet arthropathy. Moderate to severe bilateral neuroforaminal stenosis. C7-T1: Anterolisthesis with uncovering of disc material. No severe spinal canal or neuroforaminal stenosis. Paraspinal soft tissues are unremarkable.  Impression 1.  Acute right parascapular region into the right shoulder, right triceps into the ulnar aspect of the forearm into digits 3 through 5.  Clinically symptoms most consistent with cervical radiculitis.Cervical spine x-ray from [facility] clinic dated 03/20/2021 revealed multilevel degenerative changes.  2.  Left buttock pain with radiation into the lateral thigh and lateral calf.  Clinically has symptoms consistent with an L5 radiculitis.Lumbar spine x-rays from [facility] clinic dated 07/30/2022 revealed multilevel degenerative changes.  MRI from [facility] dated 05/07/16 demonstrates at L5-S1, there is moderate disc desiccation with early reactive endplate changes there is mild broad-based disc bulging with a superimposed left paracentral disc herniation.  This appears to impinge upon the left S1 nerve root.  There is no central or foraminal stenosis.  At L4-5 there is mild disc desiccation mild disc bulging.  There is no significant central or foraminal stenosis.  At L3-4 there is minimal disc desiccation without significant disc bulging.  There are mild facet degenerative changes.  There is no central or foraminal stenosis.  At L2-3 there is minimal disc bulging without central or foraminal stenosis.  At L1-2 there is mild disc desiccation and broad-based disc bulging without central or foraminal stenosis.  2.   History of low back pain with radiation into the right buttock, lateral thigh, and localized to the knee.  Clinically has symptoms consistent with an L4 radiculopathy associated with a hypoactive right patellar reflex.    3.  Right parascapular pain with radiation into the right shoulder.  Clinically has symptoms consistent with muscular spasm and a cervical  radiculitis.   3.  Bradycardia. 4.  Hypertension, dyslipidemia, gastroesophageal reflux disease,   5. Right greater trochanteric bursitis   Plan 1.  He tries to avoid tizanidine 4 mg 1/2-1 tablet twice daily as needed #60 2.  Continue gabapentin  300 mg 2 capsules 3 times daily 3.  Continue Norco 5/325 3 times daily as needed, #70 do not fill until 06/09/2024, 07/09/2024 and 08/08/2024. 4.  Continue home exercise program as learned in [facility]. Along with yoga (his neighbor is a marine scientist).  We have also discussed weight loss. 5.  Referral to neurosurgery for cervical spine stenosis  6.  Continue occasional use of Voltaren gel 7.  We have discussed stretches for greater trochanteric bursa pain.  8.   At this time he will follow-up with our office in 3 months for reevaluation.  Patient is bradycardic at today's visit and this is normal for him he is followed by cardiology.  Patient states he feels in his normal state of health.  Denies any headache, chest pain, shortness of breath or vision changes.  I personally performed the service, non-incident to.  [facility])  WHITNEY MEELER, NP  Dr. Avanell and I dicussed the role of long term narcotics for this patient and is in agreeance with the medication prescribed.   This note was generated in part with voice recognition software and I apologize for any typographical errors that were not detected and corrected.  PCP:  [facility]

## 2024-05-24 ENCOUNTER — Ambulatory Visit: Admit: 2024-05-24 | Admitting: Orthopedic Surgery

## 2024-05-24 ENCOUNTER — Encounter: Payer: Self-pay | Admitting: Urgent Care

## 2024-05-24 DIAGNOSIS — M1712 Unilateral primary osteoarthritis, left knee: Secondary | ICD-10-CM

## 2024-05-24 DIAGNOSIS — E118 Type 2 diabetes mellitus with unspecified complications: Secondary | ICD-10-CM

## 2024-06-23 ENCOUNTER — Ambulatory Visit: Admitting: Family Medicine
